# Patient Record
Sex: Female | Born: 1980 | Hispanic: Yes | Marital: Married | Smoking: Never smoker
Health system: Southern US, Community
[De-identification: ages and names within clinical notes are randomized; demographics above are authoritative.]

## PROBLEM LIST (undated history)

## (undated) ENCOUNTER — Emergency Department (HOSPITAL_COMMUNITY): Payer: Self-pay

## (undated) DIAGNOSIS — R519 Headache, unspecified: Secondary | ICD-10-CM

## (undated) DIAGNOSIS — G43909 Migraine, unspecified, not intractable, without status migrainosus: Secondary | ICD-10-CM

## (undated) DIAGNOSIS — K603 Anal fistula, unspecified: Secondary | ICD-10-CM

## (undated) DIAGNOSIS — J45909 Unspecified asthma, uncomplicated: Secondary | ICD-10-CM

## (undated) DIAGNOSIS — Z8709 Personal history of other diseases of the respiratory system: Secondary | ICD-10-CM

## (undated) HISTORY — PX: NO PAST SURGERIES: SHX2092

## (undated) HISTORY — DX: Anal fistula, unspecified: K60.30

## (undated) HISTORY — DX: Anal fistula: K60.3

---

## 2010-07-30 DIAGNOSIS — I729 Aneurysm of unspecified site: Secondary | ICD-10-CM

## 2010-07-30 DIAGNOSIS — H547 Unspecified visual loss: Secondary | ICD-10-CM

## 2010-07-30 HISTORY — DX: Unspecified visual loss: H54.7

## 2010-07-30 HISTORY — DX: Aneurysm of unspecified site: I72.9

## 2012-07-30 NOTE — L&D Delivery Note (Signed)
Attestation of Attending Supervision of Advanced Practitioner (CNM/NP): Evaluation and management procedures were performed by the Advanced Practitioner under my supervision and collaboration.  I have reviewed the Advanced Practitioner's note and chart, and I agree with the management and plan.  HARRAWAY-SMITH, Damonique Brunelle 1:00 PM     

## 2012-07-30 NOTE — L&D Delivery Note (Signed)
Delivery Note At 9:00 AM a viable female was delivered via Vaginal, Spontaneous Delivery (Presentation: ROA).  APGAR: 8, 9; weight pending.   Placenta status: spontaneous, intact.  Cord: 3 vessel cord, central insertion with the following complications: none .  Cord pH: not sent. Infant placed directly on mother's chest.  Bonding with mother and father.  Siblings present at birth.  Anesthesia: Epidural  Lacerations: Insignificant, no repair Est. Blood Loss (mL): 300  Mom to postpartum.  Baby to nursery-stable.  Selena Lesser 05/05/2013, 9:15 AM  I was present for delivery and agree with note above. [facility]

## 2013-05-01 ENCOUNTER — Encounter (HOSPITAL_COMMUNITY): Payer: Self-pay | Admitting: *Deleted

## 2013-05-01 ENCOUNTER — Inpatient Hospital Stay (HOSPITAL_COMMUNITY)
Admission: AD | Admit: 2013-05-01 | Discharge: 2013-05-01 | Disposition: A | Discharge status: Home / Self Care | Payer: Medicaid Other | Source: Ambulatory Visit | Attending: Obstetrics & Gynecology | Admitting: Obstetrics & Gynecology

## 2013-05-01 DIAGNOSIS — O479 False labor, unspecified: Secondary | ICD-10-CM | POA: Insufficient documentation

## 2013-05-01 DIAGNOSIS — O0933 Supervision of pregnancy with insufficient antenatal care, third trimester: Secondary | ICD-10-CM

## 2013-05-01 DIAGNOSIS — O0930 Supervision of pregnancy with insufficient antenatal care, unspecified trimester: Secondary | ICD-10-CM | POA: Insufficient documentation

## 2013-05-01 DIAGNOSIS — N390 Urinary tract infection, site not specified: Secondary | ICD-10-CM | POA: Insufficient documentation

## 2013-05-01 DIAGNOSIS — O2390 Unspecified genitourinary tract infection in pregnancy, unspecified trimester: Secondary | ICD-10-CM | POA: Insufficient documentation

## 2013-05-01 HISTORY — DX: Unspecified asthma, uncomplicated: J45.909

## 2013-05-01 LAB — URINALYSIS, ROUTINE W REFLEX MICROSCOPIC
Protein, ur: NEGATIVE mg/dL
Urobilinogen, UA: 0.2 mg/dL (ref 0.0–1.0)
pH: 6 (ref 5.0–8.0)

## 2013-05-01 LAB — HEPATITIS B SURFACE ANTIGEN: Hepatitis B Surface Ag: NEGATIVE

## 2013-05-01 LAB — ABO/RH: ABO/RH(D): A POS

## 2013-05-01 LAB — CBC
MCH: 28.6 pg (ref 26.0–34.0)
MCHC: 33.9 g/dL (ref 30.0–36.0)
MCV: 84.2 fL (ref 78.0–100.0)
Platelets: 207 10*3/uL (ref 150–400)
RBC: 4.69 MIL/uL (ref 3.87–5.11)
RDW: 13.7 % (ref 11.5–15.5)

## 2013-05-01 LAB — RAPID HIV SCREEN (WH-MAU): Rapid HIV Screen: NONREACTIVE

## 2013-05-01 LAB — DIFFERENTIAL
Basophils Relative: 0 % (ref 0–1)
Eosinophils Absolute: 0.1 10*3/uL (ref 0.0–0.7)
Eosinophils Relative: 1 % (ref 0–5)
Lymphs Abs: 2.2 10*3/uL (ref 0.7–4.0)
Monocytes Absolute: 0.7 10*3/uL (ref 0.1–1.0)
Neutrophils Relative %: 63 % (ref 43–77)

## 2013-05-01 LAB — OB RESULTS CONSOLE GC/CHLAMYDIA: Gonorrhea: NEGATIVE

## 2013-05-01 LAB — URINE MICROSCOPIC-ADD ON

## 2013-05-01 MED ORDER — NITROFURANTOIN MONOHYD MACRO 100 MG PO CAPS: 100.0000 mg | ORAL_CAPSULE | Freq: Two times a day (BID) | ORAL | Status: DC

## 2013-05-01 NOTE — MAU Note (Addendum)
Patient received PNC in [place]. Has not been seen there since July. States son is receiving care in [place]. Denies problems with pregnancy. C/O UC's on and off for a week. More frequent and painful since 0200. C/O yellow vaginal D/C. Denies ROM or bleeding. In course of interview, patient mentions she has had burning with urination.

## 2013-05-01 NOTE — MAU Provider Note (Signed)
History     CSN: [id number]  Arrival date and time: 05/01/13 8657   None     Chief Complaint  Patient presents with  . Contractions   HPI Regina Shea is a 32 y.o. G3P2002 at [redacted]w[redacted]d by pt report presents for evaluation of contractions this morning. Patient states that she has received prenatal care in [place]. Here because her son is being seen for Wynelle Link syndrome [place] Patient has been seen since July has not had prenatal visits since that time. Patient reports that she has normal fetal movement in the morning then decreased rest the day at least 10 in 2 hours. Patient states that she has occasional contractions about every 15 minutes, no vaginal bleeding, no loss of fluid. Patient otherwise is in usual state of health   OB History   Grav Para Term Preterm Abortions TAB SAB Ect Mult Living   3 2 2       2       Past Medical History  Diagnosis Date  . Asthma     Past Surgical History  Procedure Laterality Date  . No past surgeries      History reviewed. No pertinent family history.  History  Substance Use Topics  . Smoking status: Never Smoker   . Smokeless tobacco: Never Used  . Alcohol Use: No    Allergies: No Known Allergies  Prescriptions prior to admission  Medication Sig Dispense Refill  . folic acid (FOLVITE) 1 MG tablet Take 1 mg by mouth daily.      Marland Kitchen PRESCRIPTION MEDICATION Inhale 2 puffs into the lungs every 6 (six) hours as needed (inhaler used for asthma, rescue).        Review of Systems  Constitutional: Negative for fever and chills.  Eyes: Negative for blurred vision and double vision.  Cardiovascular: Negative for chest pain.  Gastrointestinal: Positive for abdominal pain. Negative for nausea and vomiting.  Genitourinary: Positive for dysuria. Negative for urgency, frequency, hematuria and flank pain.  Neurological: Negative for headaches.   Physical Exam   Blood pressure 113/71, pulse 89, temperature 98.2  F (36.8 C), temperature source Oral, resp. rate 18, height 5' 1.5" (1.562 m), weight 80.74 kg (178 lb).  Physical Exam  Nursing note and vitals reviewed. Constitutional: She is oriented to person, place, and time. She appears well-developed and well-nourished. No distress.  HENT:  Head: Normocephalic and atraumatic.  Eyes: Conjunctivae and EOM are normal.  Neck: Normal range of motion.  GI: Soft. She exhibits no distension and no mass. There is no tenderness (gravid). There is no rebound and no guarding.  Genitourinary: Vagina normal.  Neurological: She is alert and oriented to person, place, and time.  Skin: She is not diaphoretic.  Psychiatric: She has a normal mood and affect. Her behavior is normal. Thought content normal.   Dilation: 2 Presentation: Vertex (verified by Regina Shea, bedside U/S) Exam by:: Regina Shea  Exam in presence of Spanish interpreter.    Results for Regina, Shea (MRN [id number]) as of 05/01/2013 10:43  Ref. Range 05/01/2013 09:15  Color, Urine Latest Range: YELLOW  YELLOW  APPearance Latest Range: CLEAR  CLOUDY (A)  Specific Gravity, Urine Latest Range: 1.005-1.030  >1.030 (H)  pH Latest Range: 5.0-8.0  6.0  Glucose Latest Range: NEGATIVE mg/dL NEGATIVE  Bilirubin Urine Latest Range: NEGATIVE  NEGATIVE  Ketones, ur Latest Range: NEGATIVE mg/dL NEGATIVE  Protein Latest Range: NEGATIVE mg/dL NEGATIVE  Urobilinogen, UA Latest Range: 0.0-1.0 mg/dL 0.2  Nitrite Latest Range: NEGATIVE  NEGATIVE  Leukocytes, UA Latest Range: NEGATIVE  MODERATE (A)  Hgb urine dipstick Latest Range: NEGATIVE  NEGATIVE  Urine-Other No range found MUCOUS PRESENT  WBC, UA Latest Range: <3 WBC/hpf 21-50  Squamous Epithelial / LPF Latest Range: RARE  MANY (A)  Bacteria, UA Latest Range: RARE  FEW (A)   FHT 140s mod var, multi accels >15x15, no decels Toco: Uterine irritability.  MAU Course  Procedures  MDM Collected pt prenatal labs, ordered ob comp [place], and confirmed  vtx. Will treat for UTI given sx with +LE  Assessment and Plan  Regina Shea is a 32 y.o. G3P2002 at [redacted]w[redacted]d [place] Ordered PNL, no evidence of labor at this time. Will have pt follow up in clinic next week. Reactive and reassuring infant.   Tx uti with macrobid  Last 2 births required induction.  Regina Shea 05/01/2013, 10:38 AM

## 2013-05-02 LAB — GC/CHLAMYDIA PROBE AMP
CT Probe RNA: NEGATIVE
GC Probe RNA: NEGATIVE

## 2013-05-02 LAB — URINE CULTURE
Colony Count: NO GROWTH
Culture: NO GROWTH

## 2013-05-04 ENCOUNTER — Encounter: Payer: Self-pay | Admitting: Obstetrics and Gynecology

## 2013-05-04 ENCOUNTER — Encounter (HOSPITAL_COMMUNITY): Payer: Self-pay | Admitting: *Deleted

## 2013-05-04 ENCOUNTER — Inpatient Hospital Stay (HOSPITAL_COMMUNITY)
Admission: AD | Admit: 2013-05-04 | Discharge: 2013-05-07 | DRG: 775 | Disposition: A | Discharge status: Home / Self Care | Payer: Medicaid Other | Source: Ambulatory Visit | Attending: Obstetrics & Gynecology | Admitting: Obstetrics & Gynecology

## 2013-05-04 ENCOUNTER — Encounter (HOSPITAL_COMMUNITY): Payer: Self-pay | Admitting: Anesthesiology

## 2013-05-04 ENCOUNTER — Inpatient Hospital Stay (HOSPITAL_COMMUNITY): Payer: Medicaid Other | Admitting: Anesthesiology

## 2013-05-04 ENCOUNTER — Ambulatory Visit (HOSPITAL_COMMUNITY)
Admission: RE | Admit: 2013-05-04 | Discharge: 2013-05-04 | Disposition: A | Discharge status: Home / Self Care | Payer: Medicaid Other | Source: Ambulatory Visit | Attending: Obstetrics and Gynecology | Admitting: Obstetrics and Gynecology

## 2013-05-04 ENCOUNTER — Other Ambulatory Visit: Payer: Self-pay | Admitting: Obstetrics and Gynecology

## 2013-05-04 ENCOUNTER — Ambulatory Visit (INDEPENDENT_AMBULATORY_CARE_PROVIDER_SITE_OTHER): Payer: Medicaid Other | Admitting: Obstetrics and Gynecology

## 2013-05-04 VITALS — BP 120/79 | Ht 61.02 in | Wt 177.8 lb

## 2013-05-04 DIAGNOSIS — O0930 Supervision of pregnancy with insufficient antenatal care, unspecified trimester: Secondary | ICD-10-CM

## 2013-05-04 DIAGNOSIS — Z22330 Carrier of Group B streptococcus: Secondary | ICD-10-CM

## 2013-05-04 DIAGNOSIS — Z3480 Encounter for supervision of other normal pregnancy, unspecified trimester: Secondary | ICD-10-CM | POA: Insufficient documentation

## 2013-05-04 DIAGNOSIS — O368130 Decreased fetal movements, third trimester, not applicable or unspecified: Secondary | ICD-10-CM

## 2013-05-04 DIAGNOSIS — O368190 Decreased fetal movements, unspecified trimester, not applicable or unspecified: Principal | ICD-10-CM | POA: Diagnosis present

## 2013-05-04 DIAGNOSIS — Z3483 Encounter for supervision of other normal pregnancy, third trimester: Secondary | ICD-10-CM

## 2013-05-04 DIAGNOSIS — O0933 Supervision of pregnancy with insufficient antenatal care, third trimester: Secondary | ICD-10-CM

## 2013-05-04 DIAGNOSIS — O480 Post-term pregnancy: Secondary | ICD-10-CM | POA: Insufficient documentation

## 2013-05-04 DIAGNOSIS — O99892 Other specified diseases and conditions complicating childbirth: Secondary | ICD-10-CM | POA: Diagnosis present

## 2013-05-04 DIAGNOSIS — Z23 Encounter for immunization: Secondary | ICD-10-CM

## 2013-05-04 DIAGNOSIS — Z3689 Encounter for other specified antenatal screening: Secondary | ICD-10-CM | POA: Insufficient documentation

## 2013-05-04 LAB — POCT URINALYSIS DIP (DEVICE)
Glucose, UA: NEGATIVE mg/dL
Nitrite: NEGATIVE
Protein, ur: NEGATIVE mg/dL
Urobilinogen, UA: 0.2 mg/dL (ref 0.0–1.0)

## 2013-05-04 LAB — TYPE AND SCREEN: ABO/RH(D): A POS

## 2013-05-04 LAB — CBC
Hemoglobin: 14.3 g/dL (ref 12.0–15.0)
MCH: 28.7 pg (ref 26.0–34.0)
MCV: 84.7 fL (ref 78.0–100.0)
RBC: 4.98 MIL/uL (ref 3.87–5.11)

## 2013-05-04 LAB — RPR: RPR Ser Ql: NONREACTIVE

## 2013-05-04 LAB — GLUCOSE TOLERANCE, 1 HOUR (50G) W/O FASTING: Glucose, 1 Hour GTT: 118 mg/dL (ref 70–140)

## 2013-05-04 MED ORDER — LACTATED RINGERS IV SOLN: 500.0000 mL | Freq: Once | INTRAVENOUS | Status: DC

## 2013-05-04 MED ORDER — EPHEDRINE 5 MG/ML INJ
10.0000 mg | INTRAVENOUS | Status: DC | PRN
  Filled 2013-05-04: qty 2

## 2013-05-04 MED ORDER — FLEET ENEMA 7-19 GM/118ML RE ENEM: 1.0000 | ENEMA | RECTAL | Status: DC | PRN

## 2013-05-04 MED ORDER — TETANUS-DIPHTH-ACELL PERTUSSIS 5-2.5-18.5 LF-MCG/0.5 IM SUSP
0.5000 mL | Freq: Once | INTRAMUSCULAR | Status: AC
  Administered 2013-05-04: 0.5 mL via INTRAMUSCULAR

## 2013-05-04 MED ORDER — LACTATED RINGERS IV SOLN: 500.0000 mL | INTRAVENOUS | Status: DC | PRN

## 2013-05-04 MED ORDER — FENTANYL 2.5 MCG/ML BUPIVACAINE 1/10 % EPIDURAL INFUSION (WH - ANES)
14.0000 mL/h | INTRAMUSCULAR | Status: DC | PRN
  Administered 2013-05-04 – 2013-05-05 (×2): 14 mL/h via EPIDURAL
  Filled 2013-05-04 (×2): qty 125

## 2013-05-04 MED ORDER — TERBUTALINE SULFATE 1 MG/ML IJ SOLN: 0.2500 mg | Freq: Once | INTRAMUSCULAR | Status: AC | PRN

## 2013-05-04 MED ORDER — PENICILLIN G POTASSIUM 5000000 UNITS IJ SOLR
5.0000 10*6.[IU] | Freq: Once | INTRAVENOUS | Status: AC
  Administered 2013-05-04: 5 10*6.[IU] via INTRAVENOUS
  Filled 2013-05-04: qty 5

## 2013-05-04 MED ORDER — PHENYLEPHRINE 40 MCG/ML (10ML) SYRINGE FOR IV PUSH (FOR BLOOD PRESSURE SUPPORT)
80.0000 ug | PREFILLED_SYRINGE | INTRAVENOUS | Status: DC | PRN
  Administered 2013-05-04: 80 ug via INTRAVENOUS
  Filled 2013-05-04: qty 2

## 2013-05-04 MED ORDER — DIPHENHYDRAMINE HCL 50 MG/ML IJ SOLN: 12.5000 mg | INTRAMUSCULAR | Status: DC | PRN

## 2013-05-04 MED ORDER — ONDANSETRON HCL 4 MG/2ML IJ SOLN: 4.0000 mg | Freq: Four times a day (QID) | INTRAMUSCULAR | Status: DC | PRN

## 2013-05-04 MED ORDER — LIDOCAINE HCL (PF) 1 % IJ SOLN
INTRAMUSCULAR | Status: DC | PRN
  Administered 2013-05-04 (×2): 5 mL

## 2013-05-04 MED ORDER — OXYTOCIN 40 UNITS IN LACTATED RINGERS INFUSION - SIMPLE MED
1.0000 m[IU]/min | INTRAVENOUS | Status: DC
  Administered 2013-05-04 – 2013-05-05 (×2): 2 m[IU]/min via INTRAVENOUS
  Filled 2013-05-04: qty 1000

## 2013-05-04 MED ORDER — EPHEDRINE 5 MG/ML INJ
10.0000 mg | INTRAVENOUS | Status: DC | PRN
  Filled 2013-05-04: qty 2
  Filled 2013-05-04: qty 4

## 2013-05-04 MED ORDER — OXYTOCIN 40 UNITS IN LACTATED RINGERS INFUSION - SIMPLE MED: 62.5000 mL/h | INTRAVENOUS | Status: DC

## 2013-05-04 MED ORDER — MISOPROSTOL 25 MCG QUARTER TABLET
25.0000 ug | ORAL_TABLET | ORAL | Status: DC | PRN
  Administered 2013-05-04: 25 ug via VAGINAL
  Filled 2013-05-04 (×2): qty 1

## 2013-05-04 MED ORDER — PENICILLIN G POTASSIUM 5000000 UNITS IJ SOLR
2.5000 10*6.[IU] | INTRAVENOUS | Status: DC
  Administered 2013-05-04 – 2013-05-05 (×4): 2.5 10*6.[IU] via INTRAVENOUS
  Filled 2013-05-04 (×7): qty 2.5

## 2013-05-04 MED ORDER — PHENYLEPHRINE 40 MCG/ML (10ML) SYRINGE FOR IV PUSH (FOR BLOOD PRESSURE SUPPORT)
80.0000 ug | PREFILLED_SYRINGE | INTRAVENOUS | Status: DC | PRN
  Filled 2013-05-04: qty 2
  Filled 2013-05-04: qty 5

## 2013-05-04 MED ORDER — OXYTOCIN BOLUS FROM INFUSION: 500.0000 mL | INTRAVENOUS | Status: DC

## 2013-05-04 MED ORDER — OXYCODONE-ACETAMINOPHEN 5-325 MG PO TABS: 1.0000 | ORAL_TABLET | ORAL | Status: DC | PRN

## 2013-05-04 MED ORDER — CITRIC ACID-SODIUM CITRATE 334-500 MG/5ML PO SOLN: 30.0000 mL | ORAL | Status: DC | PRN

## 2013-05-04 MED ORDER — LIDOCAINE HCL (PF) 1 % IJ SOLN
30.0000 mL | INTRAMUSCULAR | Status: DC | PRN
  Filled 2013-05-04 (×2): qty 30

## 2013-05-04 MED ORDER — ACETAMINOPHEN 325 MG PO TABS: 650.0000 mg | ORAL_TABLET | ORAL | Status: DC | PRN

## 2013-05-04 MED ORDER — LACTATED RINGERS IV SOLN
INTRAVENOUS | Status: DC
  Administered 2013-05-04 – 2013-05-05 (×3): via INTRAVENOUS

## 2013-05-04 MED ORDER — IBUPROFEN 600 MG PO TABS: 600.0000 mg | ORAL_TABLET | Freq: Four times a day (QID) | ORAL | Status: DC | PRN

## 2013-05-04 NOTE — Anesthesia Procedure Notes (Signed)

## 2013-05-04 NOTE — Progress Notes (Signed)
Patient to be worked into the U/S schedule today after her labs ar e drawn at 61 amTthe interpreter will walk her upstairs to her appt.

## 2013-05-04 NOTE — Progress Notes (Signed)
Pulse- 106 Patient does reports less movement than usual for baby; patient also reports pain with nightly contractions

## 2013-05-04 NOTE — Patient Instructions (Signed)
Embarazo  Systems analyst trimestre  (Pregnancy - Third Trimester) El tercer trimestre del Psychiatrist (los ltimos 3 meses) es el perodo en el cual tanto usted como su beb crecen con ms rapidez. El beb alcanza un largo de aproximadamente 50 cm. y pesa entre 2,700 y 4,500 kg. El beb gana ms tejido graso y est listo para la vida fuera del cuerpo de la Rogers. Mientras estn en el interior, los bebs tienen perodos de sueo y vigilia, Warehouse manager y tienen hipo. Quizs sienta pequeas contracciones del tero. Este es el falso trabajo de Time. Tambin se las conoce como contracciones de Braxton-Hicks . Es como una prctica del parto. Los problemas ms habituales de esta etapa del embarazo incluyen mayor dificultad para respirar, hinchazn de las manos y los pies por retencin de lquidos y la necesidad de Geographical information systems officer con ms frecuencia debido a que el tero y el beb presionan sobre la vejiga.  EXAMENES PRENATALES   Durante los Manpower Inc, deber seguir realizndose anlisis de [place]. Estas pruebas se realizan para controlar su salud y la del beb. Los ARAMARK Corporation de sangre se Radiographer, therapeutic para [employer] niveles de algunos compuestos de la sangre (hemoglobina). La anemia (bajo nivel de hemoglobina) es frecuente durante el embarazo. Para prevenirla, se administran hierro y vitaminas. Tambin le tomarn nuevas anlisis para descartar diabetes. Podrn repetirle algunas de las Hovnanian Enterprises hicieron previamente.  En cada visita le medirn el tamao del tero. Esto permite asegurar que el beb se desarrolla adecuadamente, segn la fecha del embarazo.  Le controlarn la presin arterial en cada visita prenatal. Esto es para asegurarse de que no sufre toxemia.  Le harn un anlisis de orina en cada visita prenatal, para descartar infecciones, diabetes y la presencia de protenas.  Tambin en cada visita controlarn su peso. Esto se realiza para asegurarse que aumenta de peso al ritmo indicado y que usted y  su beb evolucionan normalmente.  En algunas ocasiones se realiza una prueba de ultrasonido para confirmar el correcto desarrollo y evolucin del beb. Esta prueba se realiza con ondas sonoras inofensivas para el beb, de modo que el profesional pueda calcular ms precisamente la fecha del [place].  Analice con su mdico los analgsicos y la anestesia que recibir durante el Verona de parto y Schriever.  Comente la posibilidad de que necesite una cesrea y qu anestesia se recibir.  Informe a su mdico si sufre violencia familiar mental o fsica. A veces, se indica la prueba especializada sin estrs, la prueba de tolerancia a las contracciones y el perfil biofsico para asegurarse de que el beb no tiene problemas. El estudio del lquido amnitico que rodea al beb se llama amniocentesis. El lquido amnitico se obtiene introduciendo una aguja en el vientre (abdomen ). En ocasiones se lleva a cabo cerca del final del embarazo, si es necesario inducir a un parto. En este caso se realiza para asegurarse que los pulmones del beb estn lo suficientemente maduros como para que pueda vivir fuera del tero. Si los pulmones no han madurado y es peligroso que el beb nazca, se Building services engineer a la madre una inyeccin de [place] , 1 a 2 [address]  [address] antes del [address]. Vivia Budge ayuda a que los pulmones del beb maduren y sea ms seguro su nacimiento.  CAMBIOS QUE OCURREN EN EL TERCER TRIMESTRE DEL EMBARAZO  Su organismo atravesar numerosos cambios durante el Melvin. Estos pueden variar de Neomia Dear persona a otra. Converse con el profesional que la asiste acerca los cambios que  usted note y que la preocupen.   Durante el ltimo trimestre probablemente sienta un aumento del apetito. Es normal tener "antojos" de Development worker, community. Esto vara de Neomia Dear persona a otra y de un embarazo a Therapist, art.  Podrn aparecer las primeras estras en las caderas, abdomen y [place]on. Estos son cambios normales del cuerpo durante el [place]rett. No existen  medicamentos ni ejercicios que puedan prevenir CarMax.  La constipacin puede tratarse con un laxante o agregando fibra a su dieta. Beber grandes cantidades de lquidos, tomar fibras en forma de vegetales, frutas y granos integrales es de gran [place].  Tambin es beneficioso practicar actividad fsica. Si ha sido una persona Engineer, mining, podr continuar con la Harley-Davidson de las actividades durante el mismo. Si ha sido [employer], puede ser beneficioso que comience con un programa de ejercicios, Museum/gallery exhibitions officer. Consulte con el profesional que la asiste antes de comenzar un programa de ejercicios.  Evite el consumo de cigarrillos, el alcohol, los medicamentos no recetados y las "drogas de la calle" durante el Psychiatrist. Estas sustancias qumicas afectan la formacin y el desarrollo del beb. Evite estas sustancias durante todo el embarazo para asegurar el nacimiento de un beb sano.  Podr sentir dolor de espalda, tener vrices en las venas y hemorroides, o si ya los sufra, pueden Cricket.  Durante el tercer trimestre se cansar con ms facilidad, lo cual es normal.  Los movimientos del beb pueden ser ms fuertes y con ms frecuencia.  Puede que note dificultades para respirar normalmente.  El ombligo puede salir hacia afuera.  A veces sale Veterinary surgeon de las [place], que se llama Product manager.  Podr aparecer Neomia Dear secrecin mucosa con sangre. Esto suele ocurrir General Electric unos [address] y Neomia Dear semana antes del [place]. INSTRUCCIONES PARA EL CUIDADO EN EL HOGAR   Cumpla con las citas de control. Siga las indicaciones del mdico con respecto al uso de [place], los ejercicios y la dieta.  Durante el embarazo debe obtener nutrientes para usted y para su beb. Consuma alimentos balanceados a intervalos regulares. Elija alimentos como carne, pescado, [place] y otros productos lcteos descremados, vegetales, frutas, panes integrales y cereales. El Office Depot informar  cul es el aumento de peso ideal.  Las relaciones sexuales pueden continuarse hasta casi el final del embarazo, si no se presentan otros problemas como prdida prematura (antes de [place]s) de lquido amnitico, hemorragia vaginal o dolor en el vientre (abdominal).  Realice Tesoro Corporation, si no tiene restricciones. Consulte con el profesional que la asiste si no sabe con certeza si determinados ejercicios son seguros. El mayor aumento de peso se producir en los ltimos 2 trimestres del Psychiatrist. El ejercicio ayuda a:  Engineering geologist.  Mantenerse en forma para el trabajo de parto y [place] .  Perder peso despus del parto.  Haga reposo con frecuencia, con las piernas elevadas, o segn lo necesite para evitar los calambres y el dolor de cintura.  Use un buen sostn o como los que se usan para hacer deportes para Paramedic la sensibilidad de las [place]. Tambin puede serle til si lo [place] mientras duerme. Si pierde Product manager, podr Parker Hann[employer]in.  No utilice la baera con agua caliente, baos turcos y saunas.   Colquese el cinturn de seguridad cuando conduzca. Este la proteger a usted y al beb en caso de accidente.  Evite comer carne cruda y el contacto con los utensilios y desperdicios de los gatos. Estos  elementos contienen grmenes que pueden causar defectos de nacimiento en el beb.  Es fcil perder algo de orina durante el [place]. Apretar y Chief Operating Officer los msculos de la pelvis la ayudar con este problema. Practique detener la miccin cuando est en el bao. Estos son los mismos msculos que Development worker, international aid. Son [employer] Partners mismos msculos que utiliza cuando trata de evitar despedir gases. Puede practicar apretando estos msculos WellPoint, y repetir esto tres veces por da aproximadamente. Una vez que conozca qu msculos debe apretar, no realice estos ejercicios durante la miccin. Puede favorecerle una infeccin si la orina vuelve hacia  atrs.  Pida ayuda si tienen necesidades financieras, teraputicas o nutricionales. El profesional podr ayudarla con respecto a estas necesidades, o derivarla a otros especialistas.  Haga una lista de nmeros telefnicos de emergencia y tngalos disponibles.  Planifique como obtener ayuda de familiares o amigos cuando regrese a Programmer, applications hospital.  Hacer un ensayo sobre la partida al hospital.  [place] clases prenatales con el padre para entender, practicar y hacer preguntas sobre el Bethany de parto y el alumbramiento.  Preparar la habitacin del beb / busque Fatima Blank.  No viaje fuera de la ciudad a menos que sea absolutamente necesario y con el asesoramiento de su mdico.  Use slo zapatos de tacn bajo o sin tacn para tener mejor equilibrio y Automotive engineer cadas. USO DE MEDICAMENTOS Y CONSUMO DE DROGAS DURANTE EL [facility]   Tome las vitaminas apropiadas para esta etapa tal como se le indic. Las vitaminas deben contener un miligramo de cido flico. Guarde todas las vitaminas fuera del alcance de los nios. La ingestin de slo un par de vitaminas o tabletas que contengan hierro pueden ocasionar la New[employer] Mining en un beb o en un nio pequeo.  Evite el uso de The Mutual of Omaha, incluyendo hierbas, medicamentos de [place], sin receta o que no hayan sido sugeridos por su mdico. Slo tome medicamentos de venta libre o medicamentos recetados para Chief Technology Officer, Environmental health practitioner o fiebre como lo indique su mdico. No tome aspirina, ibuprofeno o naproxeno excepto que su mdico se lo indique.  Infrmele al profesional si consume alguna droga.  El alcohol se relaciona con ciertos defectos congnitos. Incluye el sndrome de alcoholismo fetal. Debe evitar absolutamente el consumo de alcohol, en cualquier forma. El fumar produce baja tasa de natalidad y bebs prematuros.  Las drogas ilegales o de la calle son muy perjudiciales para el beb. Estn absolutamente prohibidas. Un beb que nace de [employer] Energy, ser adicto al nacer. Ese beb tendr los mismos sntomas de abstinencia que un adulto. SOLICITE ATENCIN MDICA SI:  Tiene preguntas o preocupaciones relacionadas con el embarazo. Es mejor que llame para formular las preguntas si no puede esperar hasta la prxima visita, que sentirse preocupada por ellas.  SOLICITE ATENCIN MDICA DE INMEDIATO SI:   La temperatura oral le sube a ms de 38,9 C (102 F) o lo que su mdico le indique.  Tiene una prdida de lquido por la vagina (canal de parto). Si sospecha una ruptura de las [place], tmese la temperatura y llame al profesional para informarlo sobre esto.  Observa unas pequeas manchas, una hemorragia vaginal o elimina cogulos. Notifique al profesional acerca de la cantidad y de cuntos apsitos est utilizando.  Presenta un olor desagradable en la secrecin vaginal y observa un cambio en el color, de transparente a blanco.  Ha vomitado durante ms de 24 horas.  Siente escalofros o le sube la fiebre.  Conley Rolls  falta el aire.  Siente ardor al Beatrix Shipper.  Baja o sube ms de 2 libras (900 g), o segn lo indicado por el profesional que la asiste.  Observa que sbitamente se le hinchan el rostro, las manos, los pies o las piernas.  Siente dolor en el vientre (abdominal). Las [employer]ul en el ligamento redondo son Neomia Dear causa benigna frecuente de dolor abdominal durante el embarazo. El profesional que la asiste deber evaluarla.  Presenta dolor de cabeza intenso que no se [place].  Tiene problemas visuales, visin doble o borrosa.  Si no siente los movimientos del beb durante ms de 1 hora. Si piensa que el beb no se mueve tanto como lo haca habitualmente, coma algo que Psychologist, clinical y Target Corporation lado izquierdo durante [place]. El beb debe moverse al menos 4  5 veces por hora. Comunquese inmediatamente si el beb se mueve menos que lo indicado.  Se cae, se ve involucrada en un accidente automovilstico o sufre algn  tipo de traumatismo.  En su hogar hay violencia mental o fsica. Document Released: 04/25/2005 Document Revised: 04/09/2012 [facility] Patient Information 2014 [place], [place].  Eleccin del mtodo anticonceptivo  (Contraception Choices) La anticoncepcin (control de la natalidad) es el uso de cualquier mtodo o dispositivo para Location manager. A continuacin se indican algunos de esos mtodos.  MTODOS HORMONALES   Implante anticonceptivo. Es un tubo plstico delgado que contiene la hormona progesterona. No contiene estrgenos. El mdico inserta el tubo en la parte interna del brazo. El tubo puede Geneticist, molecular durante 3 aos. Despus de los 3 aos debe retirarse. El implante impide que los ovarios liberen vulos (ovulacin), espesa el moco cervical, lo que evita que los espermatozoides ingresen al tero y hace ms delgada la membrana que cubre el interior del tero.  Inyecciones de progesterona sola. Estas inyecciones se administran cada 3 meses para evitar el embarazo. La progesterona sinttica impide que los ovarios liberen vulos. Tambin hace que el moco cervical se espese y modifica el recubrimiento interno del tero. Esto hace ms difcil que los espermatozoides sobrevivan en el tero.  Pldoras anticonceptivas. Las pldoras anticonceptivas contienen estrgenos y Education officer, museum. Actan impidiendo que el vulo se forme en el ovario(ovulacin). Las pldoras anticonceptivas son recetadas por el mdico.Tambin se utilizan para tratar los perodos menstruales abundantes.  Minipldora. Este tipo de pldora anticonceptiva contiene slo hormona progesterona. Deben tomarse todos los [address]  [address] del mes y debe recetarlas el mdico.  Parches anticonceptivos. El parche contiene hormonas similares a las que contienen las pldoras anticonceptivas. Deben cambiarse una vez por semana y se utilizan bajo prescripcin mdica.  Anillo vaginal. Anillo vaginal contiene hormonas similares a las que  contienen las pldoras anticonceptivas. Se deja colocado durante tres semanas, se lo retira durante 1 semana y luego se coloca uno nuevo. La paciente debe sentirse cmoda para insertar y retirar el anillo de la vagina.Es necesaria la receta del mdico.  Anticonceptivos de Associate Professor. Los anticonceptivos de emergencia son mtodos para evitar un embarazo despus de una relacin sexual sin proteccin. Esta pldora puede tomarse inmediatamente despus de Child psychotherapist sexuales o hasta 5 [place] de haber tenido sexo sin proteccin. Es ms efectiva si se toma poco tiempo despus. Los anticonceptivos de emergencia estn disponibles sin prescripcin mdica. Consltelo con su farmacutico. No use los anticonceptivos de emergencia como nico mtodo anticonceptivo. MTODOS DE BARRERA   Condn masculino. Es una vaina delgada (ltex o goma) que se [place] en el pene durante el acto sexual. Deri Fuelling con  espermicida para aumentar la efectividad.  Condn femenino. Es una vaina blanda y floja que se adapta suavemente a la vagina antes de las relaciones sexuales.  Diafragma. Es una barrera de ltex redonda y Casimer Bilis que debe ser ajustada por un profesional. Se inserta en la vagina, junto con un gel espermicida. Debe insertarse antes de Management consultant. Debe dejar el diafragma colocado en la vagina durante 6 a 8 horas despus de la relacin sexual.  Capuchn cervical. Es una taza de ltex o plstico, redonda y [place] que cubre el cuello del tero y debe ser ajustada por un mdico. Puede dejarlo colocado en la vagina hasta 48 horas despus de las Clinical research associate.  Esponja. Es una pieza blanda y circular de espuma de poliuretano. Contiene un espermicida. Se inserta en la vagina despus de mojarla y antes de las [employer] St. [employer].  Espermicidas. Los espermicidas son qumicos que matan o bloquean el esperma y no lo dejan ingresar al cuello del tero y al tero. Vienen en forma de cremas, geles, supositorios,  espuma o comprimidos. No es necesario tener Emergency planning/management officer. Se insertan en la vagina con un aplicador antes de Management consultant. El proceso debe repetirse cada vez que tiene relaciones sexuales. ANTICONCEPTIVOS INTRAUTERINOS   Dispositivo intrauterino (DIU). Es un dispositivo en forma de T que se coloca en el tero durante el perodo menstrual, para Location manager. Hay dos tipos:  DIU de cobre. Este tipo de DIU est recubierto con un alambre de cobre y se inserta dentro del tero. El cobre hace que el tero y las trompas de Falopio produzcan un liquido que [employer] Stores espermatozoides. Puede permanecer colocado durante 10 aos.  DIU hormonal. Este tipo de DIU contiene la hormona progestina (progesterona sinttica). La hormona espesa el moco cervical y evita que los espermatozoides ingresen al tero y tambin afina la membrana que cubre el tero para evitar la implantacin del vulo fertilizado. La hormona debilita o destruye los espermatozoides que ingresan al tero. Puede permanecer colocado durante 5 aos. MTODOS ANTICONCEPTIVOS PERMANENTES   Ligadura de trompas en la mujer. La ligadura de trompas en la mujer se realiza sellando, atando u obstruyendo quirrgicamente las trompas de Falopio lo que impide que el vulo descienda hacia el tero.  Esterilizacin masculina. Se realiza atando los conductos por los que pasan los espermatozoides (vasectoma).Esto impide que el esperma ingrese a la vagina durante el acto sexual. Luego del procedimiento, el hombre puede eyacular lquido (semen). MTODOS DE PLANIFICACIN NATURAL   Planificacin familiar natural.  Consiste en no tener relaciones sexuales o usar un mtodo de barrera (condn, [place], capuchn cervical) en los [employer] la mujer podra quedar [place].  Mtodo calendario.  Consiste en el seguimiento de la duracin de cada ciclo menstrual y la identificacin de los perodos frtiles.  Mtodo de Occupational hygienist.  Consiste en evitar  las relaciones sexuales durante la ovulacin.  Mtodo sintotrmico. Paramedic las relaciones sexuales en la poca en la que se est ovulando, utilizando un termmetro y tendiendo en cuenta los sntomas de la ovulacin.  Mtodo post-ovulacin. Consiste en planificar las relaciones sexuales para despus de haber ovulado. Independientemente del tipo o mtodo anticonceptivo que usted elija, es importante que use condones para protegerse contra las enfermedades de transmisin sexual (ETS). Hable con su mdico con respecto a qu mtodo anticonceptivo es el ms apropiado para usted.  Document Released: 07/16/2005 Document Revised: 10/08/2011 [facility] Patient Information 2014 [place], [place].  Lactancia materna  (Breastfeeding)  El cambio hormonal  durante el embarazo produce el desarrollo del tejido [place] y un aumento en el nmero y tamao de los conductos galactforos. La hormona prolactina permite que las protenas, los azcares y las grasas de la sangre produzcan la [employer] materna en las glndulas productoras de Railroad. La hormona progesterona impide que la leche materna sea liberada antes del nacimiento del beb. Despus del nacimiento del beb, su nivel de progesterona disminuye permitiendo que la leche materna sea [place]. Pensar en el beb, as como la succin o Theatre manager, pueden estimular la liberacin de [place] de las glndulas productoras de [place].  La decisin de Company secretary) es una de las mejores opciones que usted puede hacer para usted y su beb. La informacin que sigue da una breve resea de los beneficios, as Lexicographer que debe saber sobre la [place].  LOS BENEFICIOS DE AMAMANTAR  Para el beb   La primera leche (calostro) ayuda al mejor funcionamiento del sistema digestivo del beb.   La leche tiene anticuerpos que provienen de la madre y que ayudan a prevenir las infecciones en el beb.   El beb tiene una menor incidencia de  asma, alergias y del sndrome de muerte sbita del lactante (SMSL).   Los nutrientes de la [place] materna son mejores para el beb que la [place].  La leche materna mejora el desarrollo cerebral del beb.   Su beb tendr menos gases, clicos y estreimiento.  Es menos probable que el beb desarrolle otras enfermedades, como obesidad infantil, asma o diabetes mellitus. Para usted   La lactancia materna favorece el desarrollo de un vnculo muy especial entre la madre y el beb.   Es ms conveniente, siempre disponible, a la [place] y [place].   La lactancia materna ayuda a quemar caloras y a perder el peso ganado durante el [place].   Hace que el tero se contraiga ms rpidamente a su tamao normal y Consolidated Edison sangrado despus del [place].   Las [employer] Holdings que amamantan tienen menos riesgo de Environmental education officer osteoporosis o cncer de mama o de ovario en el futuro.  FRECUENCIA DEL AMAMANTAMIENTO   Un beb sano, nacido a trmino, puede amamantarse con tanta frecuencia como cada hora, o espaciar las comidas cada tres horas. La frecuencia en la lactancia varan de un beb a otro.   Los recin nacidos deben ser alimentados por lo menos cada 2-3 horas Administrator y cada 4-5 horas durante la noche. Usted debe amamantarlo un mnimo de 8 tomas en un perodo de 24 horas.  Despierte al beb para amamantarlo si han pasado 3-4 horas desde la ltima comida.  Amamante cuando sienta la necesidad de reducir la plenitud de sus senos o cuando el beb muestre signos de [place]. Las seales de que el beb puede Gentry Fitz son:  Lenora Boys su estado de alerta o vigilancia.  Se estira.  Mueve la cabeza de un lado a otro.  Mueve la cabeza y abre la boca cuando se le toca la mejilla o la boca (reflejo de succin).  Aumenta las vocalizaciones, tales como sonidos de succin, relamerse los labios, arrullos, suspiros, o chirridos.  Mueve la [employer] boca.  Se chupa con  ganas los dedos o las manos.  Agitacin.  Llanto intermitente.  Los signos de hambre extrema requerirn que lo calme y lo consuele antes de tratar de alimentarlo. Los signos de hambre extrema son:  Agitacin.  Llanto fuerte e intenso.  Gritos.  El amamantamiento frecuente la ayudar  a producir ms [place] y a Education officer, community de Engineer, mining en los pezones e hinchazn de las [place].  LACTANCIA MATERNA   Ya sea que se encuentre acostada o sentada, asegrese que el abdomen del beb est enfrente el suyo.   Sostenga la mama con el pulgar por arriba y los otros 4 dedos por debajo del pezn. Asegrese que sus dedos se encuentren lejos del pezn y de la boca del beb.   Empuje suavemente los labios del beb con el pezn o con el dedo.   Cuando la boca del beb se abra lo suficiente, introduzca el pezn y la zona oscura que lo rodea (areola) tanto como le sea posible dentro de la boca.  Debe haber ms areola visible por arriba del labio superior que por debajo del labio inferior.  La lengua del beb debe estar entre la enca inferior y el seno.  Asegrese de que la boca del beb est en la posicin correcta alrededor del pezn (prendida). Los labios del beb deben crear un sello sobre su pecho.  Las seales de que el beb se ha prendido eficazmente al pezn son:  Payton Doughty o succiona sin dolor.  Se escucha que traga Lyondell Chemical.  No hace ruidos ni chasquidos.  Hay movimientos musculares por arriba y por delante de sus odos al Printmaker.  El beb debe succionar unos 2-3 minutos para que salga la [place]. Permita que el nio se alimente en cada mama todo lo que desee. Alimente al beb hasta que se desprenda o se quede dormido en Freight forwarder y luego ofrzcale el segundo pecho.  Las seales de que el beb est lleno y satisfecho son:  Disminuye gradualmente el nmero de succiones o no succiona.  Se queda dormido.  Extiende o relaja su cuerpo.  Retiene una pequea cantidad  de [facility] boca.  Se desprende del pecho por s mismo.  Los signos de una lactancia materna eficaz son:  Los senos han aumentado la firmeza, el peso y el tamao antes de la alimentacin.  Son ms blandos despus de amamantar.  Un aumento del volumen de [place], y tambin el cambio de su consistencia y color se producen hacia el quinto da de Tour manager.  La congestin mamaria se [place] al dar de [place].  Los pezones no duelen, ni estn agrietados ni sangran.  De ser necesario, interrumpa la succin poniendo su dedo en la esquina de la boca del beb y deslizando el dedo entre sus encas. A continuacin, retire la mama de su boca.  Es comn que los bebs regurgiten un poco despus de comer.  A menudo los bebs tragan aire al alimentarse. Esto puede hacer que se sienta molesto. Hacer eructar al beb al [place] de pecho puede ser de [place].  Se recomiendan suplementos de vitamina D para los bebs que reciben slo [address].  Evite el uso del chupete durante las primeras 4 a 6 semanas de vida.  Evite la alimentacin suplementaria con agua, frmula o jugo en lugar de la [employer]. La leche materna es todo el alimento que el beb necesita. No es necesario que el nio ingiera agua o preparados de bibern. Sus pechos producirn ms leche si se evita la alimentacin suplementaria durante las primeras semanas. COMO SABER SI EL BEB OBTIENE LA SUFICIENTE LECHE MATERNA  Preguntarse si el beb obtiene la cantidad suficiente de [place] es una preocupacin frecuente [employer]. Puede asegurarse que el beb tiene la leche suficiente si:  El beb succiona activamente y usted escucha que traga.   El beb parece estar relajado y satisfecho despus de Psychologist, clinical.   El nio se alimenta al menos 8 a 12 veces en 24 horas.  Durante los primeros 3 a 5 das de vida:  Moja 3-5 paales en 24 horas. La materia fecal debe ser blanda y [place].  Tiene al menos 3 a 4 deposiciones en 24  horas. La materia fecal debe ser blanda y [place].  A los 5-7 das de vida, el beb debe tener al menos 3-6 deposiciones en 24 horas. La materia fecal debe ser grumosa y [place] a los 5 [address]  [address] de [place].  Su beb tiene una prdida de Psychologist, counselling a 7al 10% durante los primeros 3 [address]  [address] de [address]  El beb no pierde peso despus de 3-7 [address]  [address] de [address]  El beb debe aumentar 4 a 6 libras (120 a 170 gr.) por semana despus de los 4 [address]  [address] de vida.  Aumenta de [place] a los [address] de vida y vuelve al peso del nacimiento dentro de las 2 semanas. CONGESTIN MAMARIA  Durante la primera semana despus del [place], usted puede experimentar hinchazn en las mamas (congestin Navy). Al estar congestionadas, las mamas se sienten pesadas, calientes o sensibles al tacto. El pico de la congestin ocurre a las 24 -48 horas despus del parto.   La congestin puede disminuirse:  Continuando con la Tour manager.  Aumentando la frecuencia.  Tomando duchas calientes o aplicando calor hmedo en los senos antes de cada comida. Esto aumenta la circulacin y [place] a que la [place].   Masajeando suavemente el pecho antes y [place] [employer]. Con las yemas de los dedos, masajee la pared del pecho hacia el pezn en un movimiento circular.   Asegurarse de que el beb vaca al menos uno de sus pechos en cada alimentacin. Tambin ayuda si comienza la siguiente toma en el otro seno.   Extraiga manualmente o con un sacaleches las mamas para vaciar los pechos si el beb tiene sueo o no se aliment bien. Tambin puede extraer la [employer] cuando vuelva a trabajar o si siente que se estn congestionando las [place].  Asegrese de que el beb se prende y est bien colocado durante la Market researcher. Si sigue estas indicaciones, la congestin debe mejorar en 24 a 48 horas. Si an tiene dificultades, consulte a Barista.  CUDESE USTED MISMA  Cuide sus mamas.   Bese o dchese diariamente.   Evite usar [employer].   Use un sostn de soporte Evite el uso de sostenes con aro.  Seque al aire sus pezones durante 3-4 minutos despus de cada comida.   Utilice slo apsitos de algodn en el sostn para absorber las prdidas de [place]. La prdida de un poco de [employer] las comidas es normal.   Use solamente lanolina pura en sus pezones despus de Museum/gallery exhibitions officer. Usted no tiene que lavarla antes de alimentar al beb. Otra opcin es sacarse unas gotas de [place] y [employer] pezones.  Continuar con los autocontroles de la mama. Cudese.   Consuma alimentos saludables. Alterne 3 comidas con 3 colaciones.  Evite los alimentos que usted nota que perjudican al beb.  Dixie Dials, jugos de fruta y agua para Patent examiner su sed (aproximadamente 8 vasos al Futures trader).   Descanse con frecuencia, reljese y tome sus vitaminas prenatales para evitar la fatiga, el estrs y la anemia.  Evite masticar y fumar tabaco.  Evite el consumo de alcohol y drogas.  Tome medicamentos de venta libre y recetados tal como le indic su mdico o Social research officer, government. Siempre debe consultar con su mdico o farmacutico antes de tomar cualquier medicamento, vitamina o suplemento de hierbas.  Sepa que durante la lactancia puede quedar embarazada. Si lo desea, hable con su mdico acerca de la planificacin familiar y los mtodos anticonceptivos seguros que puede utilizar durante la Market researcher. SOLICITE ATENCIN MDICA SI:   Usted siente que quiere dejar de Museum/gallery exhibitions officer o se siente frustrada con la lactancia.  Siente dolor en los senos o en los pezones.  Sus pezones estn agrietados o Water quality scientist.  Sus pechos estn irritados, sensibles o calientes.  Tiene un rea hinchada en cualquiera de los senos.  Siente escalofros o fiebre.  Tiene nuseas o vmitos.  Observa un drenaje en los pezones.  Sus mamas no se llenan antes de Marine scientist al 5to da despus del [place].  Se siente triste y deprimida.  El nio est  demasiado somnoliento como para comer.  El nio tiene problemas para Industrial/product designer.   Moja menos de 3 paales en 24 horas.  Mueve el intestino menos de 3 veces en 24 horas.  La piel del beb o la parte blanca de sus ojos est ms amarilla.   El beb no ha aumentado de [place] a los [address] de [place]. ASEGRESE DE QUE:   Comprende estas instrucciones.  Controlar su enfermedad.  Solicitar ayuda de inmediato si no mejora o si empeora. Document Released: 07/16/2005 Document Revised: 04/09/2012 [facility] Patient Information 2014 [place], [place].

## 2013-05-04 NOTE — H&P (Signed)
Pt c/o decreased fetal movement in the office and was sent for a BPP which showed a term fetus with a 4/8 BPP with very poor and extremely limited movement.  Given pts lack of sufficient prenatal care and concern for follow up will admit for IOL. Attestation of Attending Supervision of Fellow: Evaluation and management procedures were performed by the Fellow under my supervision and collaboration.  I have reviewed the Fellow's note and chart, and I agree with the management and plan.

## 2013-05-04 NOTE — Progress Notes (Signed)
Nutrition note: 1st visit consult Pt has gained 25.8# @ 40w, which is slightly > expected. Pt reports eating 3 meals/d. Pt is taking folic acid & iron supplements but no PNV. Pt reports no N/V but has some heartburn. Pt received verbal & written education on general nutrition during pregnancy/ breastfeeding. Discussed tips to decrease heartburn. Encouraged PNV/ multivitamin. Discussed benefits of BF. Pt agrees to continue taking folic acid & iron. Pt does not have WIC but plans to apply after birth. Pt plans to BF. F/u none needed-pt due today Blondell Reveal, MS, RD, LDN

## 2013-05-04 NOTE — Progress Notes (Signed)
Regina Shea, hospital interpreter, was at bedside to interpret for Dr. Reola Calkins.  Dr. Reola Calkins reviewed plan of care for method of induction, risks, benefits, and answered pt's and husband's questions.  Pt & her husband verbalized understanding with no further questions.

## 2013-05-04 NOTE — Progress Notes (Signed)
Hospital interpreter in to see if pt has questions; reassured pt.   RN answered pt's questions regarding further plan of care & fhr tracing and hospital interpreter communicated answers to pt.  Pt reassured, verbalizes understanding; has no further questions.

## 2013-05-04 NOTE — H&P (Signed)
Regina Shea is a 32 y.o. female presenting for IOL due to decreased fetal movement. Information gained from interviewing the patient was done via translator.  Maternal Medical History:  Reason for admission: Nausea. IOL due to decreased fetal movements.     OB History   Grav Para Term Preterm Abortions TAB SAB Ect Mult Living   3 2 2  0 0 0 0 0 0 2     Past Medical History  Diagnosis Date  . Asthma    Past Surgical History  Procedure Laterality Date  . No past surgeries     Family History: family history includes Hypertension in her father and mother. Social History:  reports that she has never smoked. She has never used smokeless tobacco. She reports that she does not drink alcohol or use illicit drugs.   Prenatal Transfer Tool  Maternal Diabetes: No Genetic Screening: Declined Maternal Ultrasounds/Referrals: Normal Fetal Ultrasounds or other Referrals:  None Maternal Substance Abuse:  No Significant Maternal Medications:  None Significant Maternal Lab Results:  None Other Comments:  None  Review of Systems  Constitutional: Negative for fever and chills.  HENT: Negative for sore throat.   Cardiovascular: Negative for chest pain and palpitations.  Gastrointestinal: Negative for heartburn, nausea, vomiting and abdominal pain.  Genitourinary: Negative for dysuria, urgency and frequency.  Skin: Negative.   Neurological: Negative for headaches.      Blood pressure 123/80, pulse 103, temperature 98.4 F (36.9 C), temperature source Oral, resp. rate 20, height 5' 1.02" (1.55 m), weight 80.287 kg (177 lb). Exam Physical Exam  Constitutional: She is oriented to person, place, and time. She appears well-developed and well-nourished. No distress.  HENT:  Head: Normocephalic and atraumatic.  Cardiovascular: Normal rate, regular rhythm, normal heart sounds and intact distal pulses.   Respiratory: Effort normal.  GI: Soft. Bowel sounds are normal.  Neurological:  She is alert and oriented to person, place, and time.  Skin: Skin is warm and dry. No rash noted. She is not diaphoretic. No erythema. No pallor.  Psychiatric: She has a normal mood and affect.    Prenatal labs: ABO, Rh: --/--/A POS (10/03 0941) Antibody:   Rubella: 2.30 (10/03 0941) RPR: NON REACTIVE (10/03 0941)  HBsAg: NEGATIVE (10/03 0941)  HIV: Non-reactive (10/03 0000)  GBS:     Assessment/Plan:  Patient received some prenatal care in [place]. She moved her in July and has remained. She presented for her first prenatal visit in the [place] on 05/01/13. She received all lab testing except GBS during that visit. Patient will receive a rapid GBS screen and then prophylactically treat as required. Patient is comfortable. She denies any complaint.   Patient will be given PO Cytotec. Patient wants epidural.   Bing Plume 05/04/2013, 12:55 PM  I have seen and examined this patient and agree with above documentation in the PA student's note.   Pt is a G3P2002 @ [redacted]w[redacted]d by LMP who presented today for an initial PNV after moving from [place].  She complained of slight decreased FM and was sent for [place] for placenta, limited anatomy and also BPP. BPP was 4/8 and thus she was admitted for IOL at term.  She has a beautiful category I tracing at time of admission.    * admit to L&D - routine orders - epidural prn  * IOL for BPP of 4/8 - foley bulb placed and inflated with 60cc of LR - cytotec PV also placed - will plan to then  start pit once foley bulb falls out  *FWB - cat I tracing - gbs unknown at term - will attempt collecting GBS PCR once in active labor  *anticipate SVD   Rulon Abide, M.D. [facility] Fellow 05/04/2013 6:50 PM

## 2013-05-04 NOTE — Progress Notes (Signed)
Dr. Malen Gauze explaining Epidural procedure, risks, benefits, to pt with [facility], hospital interpreter at bedside assisting with communication.  He answered pt's questions and she verbalized understanding to Dr. Malen Gauze & Gerre Couch, with this RN as a witness.

## 2013-05-04 NOTE — Addendum Note (Signed)
Addended by: Franchot Mimes on: 05/04/2013 09:38 AM   Modules accepted: Orders

## 2013-05-04 NOTE — Addendum Note (Signed)
Addended by: Sherre Lain A on: 05/04/2013 09:07 AM   Modules accepted: Orders

## 2013-05-04 NOTE — Progress Notes (Signed)
Regina Shea is a 32 y.o. G3P2002 at [redacted]w[redacted]d  admitted for IOL for BPP of 4/8.   Subjective:  Pt hurting more and more and wanting an epidural.  Foley bulb must came out. No LOF. +FM.   Objective: BP 108/66  Pulse 97  Temp(Src) 97.8 F (36.6 C) (Oral)  Resp 18  Ht 5' 1.02" (1.55 m)  Wt 80.287 kg (177 lb)  BMI 33.42 kg/m2  SpO2 100%      FHT:  FHR: 145 bpm, variability: moderate,  accelerations:  Present,  decelerations:  Present 3 variable decels after change in position with foley bulp removal UC:   irregular, every 2-6 minutes SVE:   Dilation: 4.5 Exam by:: LCarpenter,RN  Labs: Lab Results  Component Value Date   WBC 10.6* 05/04/2013   HGB 14.3 05/04/2013   HCT 42.2 05/04/2013   MCV 84.7 05/04/2013   PLT 245 05/04/2013    Assessment / Plan: IOL progressing s/p foley bulb and cytotec x 1.   Labor: will start pit @ 15mu/min and titrate up for contraction pattern improvement.  Fetal Wellbeing:  Category I Pain Control:  Labor support without medications I/D:  GBS + and on PCN Anticipated MOD:  NSVD  Pricilla Moehle L 05/04/2013, 9:34 PM

## 2013-05-04 NOTE — Progress Notes (Signed)
   Subjective:    Regina Shea is a [id number] [redacted]w[redacted]d being seen today for her first obstetrical visit.  Her obstetrical history is significant for insufficient prenatal care. 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 Patient does intend to breast feed. Pregnancy history fully reviewed. Patient reports uncomplicated prenatal care thus fat. Patient reports fetal movement throughout the day but not as strong as before  Patient reports occasional contractions.  There were no vitals filed for this visit.  HISTORY: OB History  Gravida Para Term Preterm AB SAB TAB Ectopic Multiple Living  3 2 2  0 0 0 0 0 0 2    # Outcome Date GA Lbr Len/2nd Weight Sex Delivery Anes PTL Lv  3 CUR           2 TRM 09/25/06 [redacted]w[redacted]d  6 lb (2.722 kg) M SVD   Y  1 TRM 01/15/05 [redacted]w[redacted]d  6 lb (2.722 kg) M SVD None  Y     Comments: cord wrapped around neck     Past Medical History  Diagnosis Date  . Asthma    Past Surgical History  Procedure Laterality Date  . No past surgeries     Family History  Problem Relation Age of Onset  . Hypertension Mother   . Hypertension Father      Exam    Uterus:     Pelvic Exam:    Perineum: Normal Perineum   Vulva: normal   Vagina:  normal mucosa, normal discharge   pH:    Cervix: 2/thick/post   Adnexa: not evaluated   Bony Pelvis: android  System: Breast:  normal appearance, no masses or tenderness   Skin: normal coloration and turgor, no rashes    Neurologic: oriented, no focal deficits   Extremities: normal strength, tone, and muscle mass   HEENT extra ocular movement intact   Mouth/Teeth mucous membranes moist, pharynx normal without lesions   Neck supple and no masses   Cardiovascular: regular rate and rhythm   Respiratory:  chest clear, no wheezing, crepitations, rhonchi, normal symmetric air entry   Abdomen: soft, gravid   Urinary:        Assessment:    Pregnancy: G3P2002 There are no active problems to display for this patient.       Plan:     Initial labs drawn on 10/3 1hr GCT today Prenatal vitamins. Problem list reviewed and updated. Genetic Screening discussed : too late.  Ultrasound discussed; fetal survey: ordered.  Follow up in a few days for post date testing. 50% of 30 min visit spent on counseling and coordination of care.     Mylynn Dinh 05/04/2013

## 2013-05-04 NOTE — Anesthesia Preprocedure Evaluation (Signed)
Anesthesia Evaluation  Patient identified by MRN, date of birth, ID band Patient awake    Reviewed: Allergy & Precautions, H&P , Patient's Chart, lab work & pertinent test results  Airway Mallampati: II TM Distance: >3 FB Neck ROM: full    Dental no notable dental hx.    Pulmonary neg pulmonary ROS, asthma ,  breath sounds clear to auscultation  Pulmonary exam normal       Cardiovascular negative cardio ROS  Rhythm:regular Rate:Normal     Neuro/Psych negative neurological ROS  negative psych ROS   GI/Hepatic negative GI ROS, Neg liver ROS,   Endo/Other  negative endocrine ROS  Renal/GU negative Renal ROS     Musculoskeletal   Abdominal   Peds  Hematology negative hematology ROS (+)   Anesthesia Other Findings   Reproductive/Obstetrics (+) Pregnancy                           Anesthesia Physical Anesthesia Plan  ASA: II  Anesthesia Plan: Epidural   Post-op Pain Management:    Induction:   Airway Management Planned:   Additional Equipment:   Intra-op Plan:   Post-operative Plan:   Informed Consent: I have reviewed the patients History and Physical, chart, labs and discussed the procedure including the risks, benefits and alternatives for the proposed anesthesia with the patient or authorized representative who has indicated his/her understanding and acceptance.     Plan Discussed with:   Anesthesia Plan Comments:         Anesthesia Quick Evaluation

## 2013-05-05 ENCOUNTER — Encounter: Payer: Self-pay | Admitting: Family

## 2013-05-05 ENCOUNTER — Encounter (HOSPITAL_COMMUNITY): Payer: Self-pay | Admitting: *Deleted

## 2013-05-05 DIAGNOSIS — O0930 Supervision of pregnancy with insufficient antenatal care, unspecified trimester: Secondary | ICD-10-CM

## 2013-05-05 DIAGNOSIS — O368190 Decreased fetal movements, unspecified trimester, not applicable or unspecified: Secondary | ICD-10-CM

## 2013-05-05 DIAGNOSIS — O9989 Other specified diseases and conditions complicating pregnancy, childbirth and the puerperium: Secondary | ICD-10-CM

## 2013-05-05 LAB — CULTURE, BETA STREP (GROUP B ONLY)

## 2013-05-05 MED ORDER — ONDANSETRON HCL 4 MG/2ML IJ SOLN: 4.0000 mg | INTRAMUSCULAR | Status: DC | PRN

## 2013-05-05 MED ORDER — SENNOSIDES-DOCUSATE SODIUM 8.6-50 MG PO TABS
2.0000 | ORAL_TABLET | ORAL | Status: DC
  Administered 2013-05-06 (×2): 2 via ORAL
  Filled 2013-05-05: qty 2

## 2013-05-05 MED ORDER — LANOLIN HYDROUS EX OINT: TOPICAL_OINTMENT | CUTANEOUS | Status: DC | PRN

## 2013-05-05 MED ORDER — IBUPROFEN 600 MG PO TABS
600.0000 mg | ORAL_TABLET | Freq: Four times a day (QID) | ORAL | Status: DC
  Administered 2013-05-05 – 2013-05-07 (×9): 600 mg via ORAL
  Filled 2013-05-05 (×9): qty 1

## 2013-05-05 MED ORDER — PRENATAL MULTIVITAMIN CH
1.0000 | ORAL_TABLET | Freq: Every day | ORAL | Status: DC
  Administered 2013-05-06 – 2013-05-07 (×2): 1 via ORAL
  Filled 2013-05-05 (×2): qty 1

## 2013-05-05 MED ORDER — PNEUMOCOCCAL VAC POLYVALENT 25 MCG/0.5ML IJ INJ
0.5000 mL | INJECTION | INTRAMUSCULAR | Status: AC
  Filled 2013-05-05: qty 0.5

## 2013-05-05 MED ORDER — OXYCODONE-ACETAMINOPHEN 5-325 MG PO TABS
1.0000 | ORAL_TABLET | ORAL | Status: DC | PRN
Start: 2013-05-05 — End: 2013-05-07
  Administered 2013-05-05 (×2): 1 via ORAL
  Filled 2013-05-05 (×2): qty 1

## 2013-05-05 MED ORDER — BENZOCAINE-MENTHOL 20-0.5 % EX AERO
1.0000 "application " | INHALATION_SPRAY | CUTANEOUS | Status: DC | PRN
  Administered 2013-05-05: 1 via TOPICAL
  Filled 2013-05-05: qty 56

## 2013-05-05 MED ORDER — TETANUS-DIPHTH-ACELL PERTUSSIS 5-2.5-18.5 LF-MCG/0.5 IM SUSP: 0.5000 mL | Freq: Once | INTRAMUSCULAR | Status: DC

## 2013-05-05 MED ORDER — DIPHENHYDRAMINE HCL 25 MG PO CAPS: 25.0000 mg | ORAL_CAPSULE | Freq: Four times a day (QID) | ORAL | Status: DC | PRN

## 2013-05-05 MED ORDER — WITCH HAZEL-GLYCERIN EX PADS: 1.0000 "application " | MEDICATED_PAD | CUTANEOUS | Status: DC | PRN

## 2013-05-05 MED ORDER — SIMETHICONE 80 MG PO CHEW: 80.0000 mg | CHEWABLE_TABLET | ORAL | Status: DC | PRN

## 2013-05-05 MED ORDER — ZOLPIDEM TARTRATE 5 MG PO TABS: 5.0000 mg | ORAL_TABLET | Freq: Every evening | ORAL | Status: DC | PRN

## 2013-05-05 MED ORDER — ONDANSETRON HCL 4 MG PO TABS: 4.0000 mg | ORAL_TABLET | ORAL | Status: DC | PRN

## 2013-05-05 MED ORDER — DIBUCAINE 1 % RE OINT: 1.0000 "application " | TOPICAL_OINTMENT | RECTAL | Status: DC | PRN

## 2013-05-05 NOTE — Progress Notes (Signed)
In-house interpreter Shanda Bumps called to interpret for education on mother and baby, and assist with interpretation of procedures and any questions patient had for herself, the baby, or preparing for discharge.

## 2013-05-05 NOTE — Progress Notes (Addendum)
Regina Shea is a 32 y.o. G3P2002 at [redacted]w[redacted]d  admitted for induction of labor due to BPP of 4/8.  Subjective:  Pt doing well. Comfortable with epidural.  No concerns.   Objective: BP 117/71  Pulse 87  Temp(Src) 99.3 F (37.4 C) (Oral)  Resp 18  Ht 5' 1.02" (1.55 m)  Wt 80.287 kg (177 lb)  BMI 33.42 kg/m2  SpO2 100%      FHT:  FHR: 145 bpm, variability: moderate,  accelerations:  Present,  decelerations:  Present variable decels often in conjunction with a contraction UC:   regular, every 2-4 minutes SVE:   Dilation: 4.5 Station: Ballotable Exam by:: K.Adiyah Lame,MD  Labs: Lab Results  Component Value Date   WBC 10.6* 05/04/2013   HGB 14.3 05/04/2013   HCT 42.2 05/04/2013   MCV 84.7 05/04/2013   PLT 245 05/04/2013    Assessment / Plan: IOL for BPP 4/8   Labor: foley bulb cervix, head more well applied, AROM performed with return of clear fluid and IUPC Placed Fetal Wellbeing:  Category II Pain Control:  Epidural I/D:  PCN for GBS + Anticipated MOD:  NSVD  Regina Shea 05/05/2013, 2:55 AM

## 2013-05-05 NOTE — Progress Notes (Signed)
Regina Shea is a 32 y.o. G3P2002 at [redacted]w[redacted]d  admitted for induction of labor due to [facility] 4/8.  Subjective:  Pt doing well. Baby with deep variables with contractions around 3:30 and pitocin was stopped. Contractions have spaced out since then. Doing well overall. Continuing to leak fluids. Some mild vaginal bleeding.  +FM  Objective: BP 123/78  Pulse 95  Temp(Src) 98.7 F (37.1 C) (Oral)  Resp 18  Ht 5' 1.02" (1.55 m)  Wt 80.287 kg (177 lb)  BMI 33.42 kg/m2  SpO2 100%      FHT:  FHR: 150 bpm, variability: moderate,  accelerations:  Present,  decelerations:  Present variables but improved from previously.  UC:   regular, every 3-5 minutes SVE:   Dilation: 8 Effacement (%): 90 Station: -1 Exam by:: Regina Hollopeter,MD  Labs: Lab Results  Component Value Date   WBC 10.6* 05/04/2013   HGB 14.3 05/04/2013   HCT 42.2 05/04/2013   MCV 84.7 05/04/2013   PLT 245 05/04/2013    Assessment / Plan: IOL due to bpp of 4/8 and off pit for the last 3 hours. Will restart now.     Labor: restart pit. cervix making change Fetal Wellbeing:  Category II Pain Control:  Epidural I/D:  GBS + on PCN Anticipated MOD:  NSVD  Caydin Yeatts L 05/05/2013, 6:55 AM

## 2013-05-06 ENCOUNTER — Other Ambulatory Visit: Payer: Self-pay

## 2013-05-06 MED ORDER — PNEUMOCOCCAL VAC POLYVALENT 25 MCG/0.5ML IJ INJ
0.5000 mL | INJECTION | INTRAMUSCULAR | Status: DC
  Filled 2013-05-06: qty 0.5

## 2013-05-06 NOTE — Plan of Care (Signed)
Problem: Consults Goal: Postpartum Patient Education (See Patient Education module for education specifics.)  Outcome: Progressing USING INTERPRETER

## 2013-05-06 NOTE — Lactation Note (Signed)
This note was copied from the chart of Regina Shea. Lactation Consultation Note  Patient Name: Regina Shea Date: 05/06/2013 Reason for consult: Follow-up assessment;Difficult latch [name], Spanish interpreter present off and on for visit. Mom reports baby is not latching with or without the nipple shield. She would like to latch baby without the nipple shield if possible. Mom reports when baby does latch using the nipple shield it is painful. On exam, Mom's nipples are erect but with short nipple shaft, some dimpling noted on the right nipple.  Hand expressed and Mom has colostrum easily expressed. Attempted to latch baby in cross cradle hold without the nipple shield, baby could not sustain a latch. Mom had been using #20 nipple shield. Changed to #24 to see if more comfortable fit for Mom. No improvement and baby had trouble organizing his suck with the larger nipple shield. Changed back to #20 nipple shield, hand expressed and demonstrated to Mom how to pre-load the nipple shield using a curved tipped syringe. After few attempts baby latched well, lips were well flanged and developed a good suckling pattern. Mom reports intermittent discomfort but some improvement. Baby nursed for 15 minutes off and on from the left breast, small amount of colostrum visible in the nipple shield at the end of the feeding. Baby starting giving feeding ques again. LC assisted Mom to latch baby in football hold on right breast using #20 nipple shield. Baby demonstrated a good rhythmic suck, scant amount of colostrum visible. Comfort gels given to Mom for nipple tenderness, no breakdown noted. Set up DEBP for Mom to pre-pump for approx 5 minutes, then post pump for 15 minutes on Preemie setting. Mom reported she agreed with plan. Advised to give baby back any amount of EBM she obtain with pumping. Advised to call for assist as needed till comfortable with latch.   Maternal Data     Feeding Feeding Type: Breast Fed Length of feed: 10 min (off and on using #20 nipple shield)  LATCH Score/Interventions Latch: Repeated attempts needed to sustain latch, nipple held in mouth throughout feeding, stimulation needed to elicit sucking reflex. (using #20 nipple shield) Intervention(s): Adjust position;Assist with latch;Breast massage;Breast compression  Audible Swallowing: None  Type of Nipple: Everted at rest and after stimulation (short nipple shaft) Intervention(s): Hand pump;Shells  Comfort (Breast/Nipple): Soft / non-tender     Hold (Positioning): Assistance needed to correctly position infant at breast and maintain latch. Intervention(s): Support Pillows;Position options  LATCH Score: 6  Lactation Tools Discussed/Used Tools: Shells;Pump Nipple shield size: 20;24 Shell Type: Inverted Breast pump type: Double-Electric Breast Pump Pump Review: Setup, frequency, and cleaning Initiated by:: KG Date initiated:: 05/06/13   Consult Status Consult Status: Follow-up Date: 05/06/13 Follow-up type: In-patient    Alfred Levins 05/06/2013, 11:59 AM

## 2013-05-06 NOTE — Progress Notes (Signed)
Post Partum Day #1 Subjective:  up ad lib, voiding and tolerating PO. No flatus or BM as of yet. Ambulating well.  Hospital translator in room for evaluation.   Objective: Blood pressure 101/66, pulse 72, temperature 97.6 F (36.4 C), temperature source Oral, resp. rate 18, height 5' 1.02" (1.55 m), weight 80.287 kg (177 lb), SpO2 97.00%, unknown if currently breastfeeding.  Physical Exam:  General: alert, cooperative and appears stated age Lochia: appropriate Uterine Fundus: firm Incision: N/A DVT Evaluation: No evidence of DVT seen on physical exam. Negative Homan's sign. No cords or calf tenderness. Patient has TTP, "electric" throughout her entire left leg and hip.    Recent Labs  05/04/13 1205  HGB 14.3  HCT 42.2    Assessment/Plan: Plan for discharge tomorrow, Breastfeeding, Lactation consult and Contraception Depo shot Will follow-up in [facility] for postpartum care and depo shot.   LOS: 2 days   Kuneff, Luster Landsberg 05/06/2013, 9:47 AM   Leg pain likely sciatica. Legs = on exam, no cords appreciated. If symptoms change or physical exam changes, will work up for DVT.   I spoke with and examined patient and agree with resident's note and plan of care.  Tawana Scale, MD OB Fellow 05/06/2013 2:12 PM

## 2013-05-06 NOTE — Lactation Note (Signed)
This note was copied from the chart of Regina Shea. Lactation Consultation Note  Patient Name: Regina Shea [id number] Date: 05/06/2013 Reason for consult: Follow-up assessment;Difficult latch;Breast/nipple pain Mom is continuing to have nipple pain with baby at the breast. Baby is chewing/biting when on the nipple shield, compression line visible with him at the breast. Attempted to change to larger (24 nipple shield) again but this is worse. Baby is humping his tongue and not flanging his lips making it difficult to obtain good depth when latching baby. He is very fussy this visit, becoming more frantic at the breast. Attempted to latch without the nipple shield but he could not sustain a latch. Mom has not pumped yet. She wants her baby to have some formula. Discussed plan with Mom and due to her sore nipples we decided to bottle feed some formula. Advised Mom to give baby 15-20 ml every 3 hours via bottle with slow flow nipple. Suck training/paced feeding discussed and demonstrated to Mom.  She is to pump every 3 hours for 15 minutes on preemie setting. Mom pumped at this visit and denied discomfort. She did not receive any EBM. She reports her milk usually comes in the 3rd day. Could not get All Purpose Nipple Cream from pharmacy. Left sticky note on Mom's chart for OB to call this to the pharmacy [facility]) for Mom before d/c tomorrow so she can pick this up on her way home. Mom did not want it called in tonight to be picked up by family. Mom has comfort gels. Mom is not registered with WIC. Advised to call WIC in am to set up appointment. [name], Spanish interpreter present for this visit.   Maternal Data    Feeding Feeding Type: Formula Length of feed: 10 min (off and on)  LATCH Score/Interventions Latch: Repeated attempts needed to sustain latch, nipple held in mouth throughout feeding, stimulation needed to elicit sucking reflex. Intervention(s): Skin to  skin;Teach feeding cues;Waking techniques Intervention(s): Adjust position;Assist with latch;Breast massage;Breast compression  Audible Swallowing: None Intervention(s): Skin to skin  Type of Nipple: Everted at rest and after stimulation (short nipple shaft bilateral) Intervention(s): Double electric pump  Comfort (Breast/Nipple): Filling, red/small blisters or bruises, mild/mod discomfort  Problem noted: Cracked, bleeding, blisters, bruises;Severe discomfort Interventions  (Cracked/bleeding/bruising/blister): Double electric pump;Expressed breast milk to nipple Interventions (Mild/moderate discomfort): Comfort gels  Hold (Positioning): Assistance needed to correctly position infant at breast and maintain latch. Intervention(s): Skin to skin;Support Pillows;Position options  LATCH Score: 5  Lactation Tools Discussed/Used Tools: Nipple Shields;Pump;Comfort gels Nipple shield size: 20;24 Breast pump type: Double-Electric Breast Pump WIC Program: No   Consult Status Date: 05/07/13 Follow-up type: In-patient    Alfred Levins 05/06/2013, 5:00 PM

## 2013-05-06 NOTE — Progress Notes (Signed)
Ur chart review completed.  

## 2013-05-07 MED ORDER — BENZOCAINE-MENTHOL 20-0.5 % EX AERO: 1.0000 "application " | INHALATION_SPRAY | CUTANEOUS | Status: DC | PRN

## 2013-05-07 MED ORDER — WITCH HAZEL-GLYCERIN EX PADS: 1.0000 "application " | MEDICATED_PAD | CUTANEOUS | Status: DC | PRN

## 2013-05-07 MED ORDER — IBUPROFEN 600 MG PO TABS: 600.0000 mg | ORAL_TABLET | Freq: Four times a day (QID) | ORAL | Status: DC

## 2013-05-07 MED ORDER — PNEUMOCOCCAL VAC POLYVALENT 25 MCG/0.5ML IJ INJ
0.5000 mL | INJECTION | INTRAMUSCULAR | Status: DC
  Filled 2013-05-07: qty 0.5

## 2013-05-07 MED ORDER — PNEUMOCOCCAL VAC POLYVALENT 25 MCG/0.5ML IJ INJ
0.5000 mL | INJECTION | Freq: Once | INTRAMUSCULAR | Status: AC
  Administered 2013-05-07: 0.5 mL via INTRAMUSCULAR
  Filled 2013-05-07: qty 0.5

## 2013-05-07 MED ORDER — DIBUCAINE 1 % RE OINT: 1.0000 "application " | TOPICAL_OINTMENT | RECTAL | Status: DC | PRN

## 2013-05-07 MED ORDER — LANOLIN HYDROUS EX OINT: 1.0000 "application " | TOPICAL_OINTMENT | CUTANEOUS | Status: DC | PRN

## 2013-05-07 NOTE — MAU Provider Note (Signed)
Attestation of Attending Supervision of Obstetric Fellow: Evaluation and management procedures were performed by the Obstetric Fellow under my supervision and collaboration.  I have reviewed the Obstetric Fellow's note and chart, and I agree with the management and plan.  Mirenda Baltazar, MD, FACOG Attending Obstetrician & Gynecologist Faculty Practice, [facility]   

## 2013-05-07 NOTE — Discharge Summary (Signed)
Obstetric Discharge Summary Reason for Admission: induction of labor for decreased fetal movement Prenatal Procedures: ultrasound Intrapartum Procedures: spontaneous vaginal delivery Postpartum Procedures: none Complications-Operative and Postpartum: none Hemoglobin  Date Value Range Status  05/04/2013 14.3  12.0 - 15.0 g/dL Final     HCT  Date Value Range Status  05/04/2013 42.2  36.0 - 46.0 % Final   Regina Shea is a 32 y.o. female presenting for IOL due to decreased fetal movement.   Physical Exam:  General: alert and cooperative Lochia: appropriate Uterine Fundus: firm Incision: N/A DVT Evaluation: No evidence of DVT seen on physical exam. Negative Homan's sign. No cords or calf tenderness.  Discharge Diagnoses: Term Pregnancy-delivered  Discharge Information: Date: 05/07/2013 Activity: pelvic rest Diet: routine Medications: Ibuprofen Condition: stable Instructions: refer to practice specific booklet Discharge to: home   Newborn Data: Live born female  Birth Weight: 7 lb 13.2 oz (3550 g) APGAR: 8, 9 Breast feeding   Home with mother.  Felix Pacini 05/07/2013, 11:16 AM  I have seen and examined this patient and agree with above documentation in the resident's note. Pt presented for BPP of 4/8 at term and IOL. She progressed and delivered a liveborn female without complications. She is breast feeding and plans for depo for birth control.  Pt to present to clinic for this injection and flag sent.   Rulon Abide, M.D. [facility] Fellow 05/07/2013 3:55 PM

## 2013-05-07 NOTE — Anesthesia Postprocedure Evaluation (Signed)
  Anesthesia Post Note  Patient: Regina Shea  Procedure(s) Performed: * No procedures listed *  Anesthesia type: Epidural  Patient location: Mother/Baby  Post pain: Pain level controlled  Post assessment: Post-op Vital signs reviewed  Last Vitals: There were no vitals filed for this visit.  Post vital signs: Reviewed  Level of consciousness: awake  Complications: No apparent anesthesia complications by review of chart

## 2013-05-07 NOTE — Lactation Note (Signed)
This note was copied from the chart of Regina Livi Sanchez-Herrera. Lactation Consultation Note  Patient Name: Regina Shea [id number] Date: 05/07/2013 Reason for consult: Follow-up assessment;Difficult latch Mom has been bottle feeding due to nipple pain with breastfeeding. This morning Mom wants to attempt BF again with nipple shield. Assisted Mom with positioning, demonstrated how to pre-load nipple shield with formula using curved tipped syringe. Baby latched after few attempts, Mom had discomfort with the initial latch but as baby became deeper with the latch, lips well flanged Mom reported very little pain. Baby nursed with good suckling pattern for about 5 minutes then fell asleep. No colostrum in the nipple shield. With suck exam, baby is still thrusting his tongue, but does develop a better pattern after few suckles. Advised Mom to continue suck training as we demonstrated yesterday using the bottle via slow flow nipple. At this time the plan is for Mom to put baby to the breast every 2-3 hours or whenever he give feeding ques using #20 nipple shield, continue to supplement till Mom's milk comes in according to guidelines given to Mom increasing each day the volume till Mom's milk comes in and baby is consistently breastfeeding with evidence of milk transfer by observing milk in nipple shield, void/stools, weight check.  FOB to give supplement while Mom uses hand pump to post pump for 15 minutes after feedings or at least every 3 hours.  Mom will use hand pump or parents will purchase pump. Advised to make OP follow up for next week. Mom will call. Engorgement care reviewed if needed. [name] Royal, Spanish interpreter present for visit.   Maternal Data    Feeding Feeding Type: Breast Fed Nipple Type: Slow - flow Length of feed: 5 min  LATCH Score/Interventions Latch: Repeated attempts needed to sustain latch, nipple held in mouth throughout feeding, stimulation needed to elicit  sucking reflex.  Audible Swallowing: A few with stimulation  Type of Nipple: Everted at rest and after stimulation (short nipple shaft) Intervention(s): Hand pump  Comfort (Breast/Nipple): Engorged, cracked, bleeding, large blisters, severe discomfort  Problem noted: Severe discomfort (with breastfeeding) Interventions  (Cracked/bleeding/bruising/blister): Hand pump;Expressed breast milk to nipple Interventions (Mild/moderate discomfort): Comfort gels  Hold (Positioning): Assistance needed to correctly position infant at breast and maintain latch.  LATCH Score: 5  Lactation Tools Discussed/Used Tools: Nipple Shields;Pump;Comfort gels Nipple shield size: 20;24 Breast pump type: Manual WIC Program: No   Consult Status Consult Status: Complete Date: 05/07/13 Follow-up type: In-patient    Regina Shea 05/07/2013, 11:57 AM

## 2013-05-08 ENCOUNTER — Encounter: Payer: Self-pay | Admitting: *Deleted

## 2013-05-23 ENCOUNTER — Emergency Department (HOSPITAL_COMMUNITY)
Admission: EM | Admit: 2013-05-23 | Discharge: 2013-05-24 | Disposition: A | Discharge status: Home / Self Care | Payer: Medicaid Other | Attending: Emergency Medicine | Admitting: Emergency Medicine

## 2013-05-23 ENCOUNTER — Encounter (HOSPITAL_COMMUNITY): Payer: Self-pay | Admitting: Emergency Medicine

## 2013-05-23 DIAGNOSIS — J45909 Unspecified asthma, uncomplicated: Secondary | ICD-10-CM | POA: Insufficient documentation

## 2013-05-23 DIAGNOSIS — R509 Fever, unspecified: Secondary | ICD-10-CM | POA: Insufficient documentation

## 2013-05-23 DIAGNOSIS — L02419 Cutaneous abscess of limb, unspecified: Secondary | ICD-10-CM | POA: Insufficient documentation

## 2013-05-23 DIAGNOSIS — L03116 Cellulitis of left lower limb: Secondary | ICD-10-CM

## 2013-05-23 MED ORDER — ACETAMINOPHEN 325 MG PO TABS
650.0000 mg | ORAL_TABLET | Freq: Once | ORAL | Status: AC
  Administered 2013-05-24: 650 mg via ORAL
  Filled 2013-05-23: qty 2

## 2013-05-23 NOTE — ED Provider Notes (Signed)
CSN: [id number]     Arrival date & time 05/23/13  2229 History   First MD Initiated Contact with Patient 05/23/13 2300     Chief Complaint  Patient presents with  . Cellulitis   HPI  History provided by the patient). Patient is a 32 year old Hispanic female with history of recent pregnancy and vaginal delivery who presents with complaints of redness, pain and swelling to her left lower leg. Symptoms first began yesterday around the shin with redness followed by worsening pain and tenderness with a red streak up the leg. Patient has reported subjective fevers and chills at home. She did not take her temperature. She did use Tylenol yesterday which helped with symptoms. She denies any injury to the skin. She is unsure of any insect bite or other trauma. She denies any associated nausea or vomiting. Denies having similar symptoms previously. She is currently breast-feeding.     Past Medical History  Diagnosis Date  . Asthma    Past Surgical History  Procedure Laterality Date  . No past surgeries     Family History  Problem Relation Age of Onset  . Hypertension Mother   . Hypertension Father    History  Substance Use Topics  . Smoking status: Never Smoker   . Smokeless tobacco: Never Used  . Alcohol Use: No   OB History   Grav Para Term Preterm Abortions TAB SAB Ect Mult Living   3 3 3  0 0 0 0 0 0 3     Review of Systems  Constitutional: Positive for fever. Negative for chills and diaphoresis.  Respiratory: Negative for shortness of breath.   Cardiovascular: Negative for chest pain.    Allergies  Review of patient's allergies indicates no known allergies.  Home Medications  No current outpatient prescriptions on file. BP 124/77  Pulse 83  Temp(Src) 98.1 F (36.7 C) (Oral)  Resp 17  SpO2 98% Physical Exam  Nursing note and vitals reviewed. Constitutional: She is oriented to person, place, and time. She appears well-developed and well-nourished. No distress.  HENT:   Head: Normocephalic.  Cardiovascular: Normal rate and regular rhythm.   Pulmonary/Chest: Effort normal and breath sounds normal. No respiratory distress. She has no wheezes. She has no rales.  Abdominal: Soft.  Musculoskeletal: Normal range of motion.  Neurological: She is alert and oriented to person, place, and time.  Skin: Skin is warm and dry.  8 cm irregular area of erythema and slight induration to the anterior left lower leg and shin. There is an erythematous streaks to the medial aspect of the leg extending to the thigh. Local tenderness to palpation. No circumferential swelling or tenderness. Normal distal pulses and sensation in the foot  Psychiatric: She has a normal mood and affect. Her behavior is normal.    ED Course  Procedures   COORDINATION OF CARE:  Nursing notes reviewed. Vital signs reviewed. Initial pt interview and examination performed.   11:20PM patient seen and evaluated. She is well-appearing no acute distress. Does not appear severely ill or toxic. She is afebrile. No treatments today for symptoms. Discussed work up plan with pt at bedside, which includes CBC with plans for outpatient antibiotic treatment. Pt agrees with plan.   Results for orders placed during the hospital encounter of 05/23/13  CBC WITH DIFFERENTIAL      Result Value Range   WBC 10.9 (*) 4.0 - 10.5 K/uL   RBC 4.94  3.87 - 5.11 MIL/uL   Hemoglobin 14.5  12.0 -  15.0 g/dL   HCT 40.9  81.1 - 91.4 %   MCV 87.2  78.0 - 100.0 fL   MCH 29.4  26.0 - 34.0 pg   MCHC 33.6  30.0 - 36.0 g/dL   RDW 78.2  95.6 - 21.3 %   Platelets 282  150 - 400 K/uL   Neutrophils Relative % 53  43 - 77 %   Neutro Abs 5.8  1.7 - 7.7 K/uL   Lymphocytes Relative 35  12 - 46 %   Lymphs Abs 3.8  0.7 - 4.0 K/uL   Monocytes Relative 10  3 - 12 %   Monocytes Absolute 1.1 (*) 0.1 - 1.0 K/uL   Eosinophils Relative 1  0 - 5 %   Eosinophils Absolute 0.2  0.0 - 0.7 K/uL   Basophils Relative 1  0 - 1 %   Basophils Absolute  0.1  0.0 - 0.1 K/uL     Imaging Review No results found.  EKG Interpretation   None       MDM   1. Cellulitis of left lower leg        Angus Seller, PA-C 05/24/13 0022

## 2013-05-23 NOTE — ED Notes (Signed)
Pt in c/o redness and pain to her lower left leg and left inner thigh, states symptoms started yesterday, also c/o fever with this

## 2013-05-23 NOTE — ED Notes (Signed)
Pt presents with redness to L leg. States she has been having a fever but has no fever at present.

## 2013-05-24 LAB — CBC WITH DIFFERENTIAL/PLATELET
Basophils Absolute: 0.1 10*3/uL (ref 0.0–0.1)
Eosinophils Relative: 1 % (ref 0–5)
HCT: 43.1 % (ref 36.0–46.0)
Hemoglobin: 14.5 g/dL (ref 12.0–15.0)
Lymphocytes Relative: 35 % (ref 12–46)
Lymphs Abs: 3.8 10*3/uL (ref 0.7–4.0)
MCH: 29.4 pg (ref 26.0–34.0)
MCV: 87.2 fL (ref 78.0–100.0)
Monocytes Relative: 10 % (ref 3–12)
Platelets: 282 10*3/uL (ref 150–400)
RBC: 4.94 MIL/uL (ref 3.87–5.11)
RDW: 12.9 % (ref 11.5–15.5)
WBC: 10.9 10*3/uL — ABNORMAL HIGH (ref 4.0–10.5)

## 2013-05-24 MED ORDER — CEPHALEXIN 500 MG PO CAPS: 500.0000 mg | ORAL_CAPSULE | Freq: Four times a day (QID) | ORAL | Status: DC

## 2013-05-24 NOTE — ED Provider Notes (Signed)
Medical screening examination/treatment/procedure(s) were performed by non-physician practitioner and as supervising physician I was immediately available for consultation/collaboration.    Sritha Chauncey M Ikaika Showers, MD 05/24/13 0116 

## 2013-06-04 ENCOUNTER — Ambulatory Visit: Payer: Self-pay | Admitting: Family

## 2014-05-31 ENCOUNTER — Encounter (HOSPITAL_COMMUNITY): Payer: Self-pay | Admitting: Emergency Medicine

## 2015-07-31 NOTE — L&D Delivery Note (Signed)
Delivery Note At 11:34 AM a viable female was delivered via Vaginal, Spontaneous Delivery augmented with AROM & Pit  (Presentation: cephalic).  APGAR: 9, 9;  Placenta status: intact, delivered with gentle traction. Cord: 3 vessel, nuchal x1   Anesthesia:  epidural Episiotomy: None Lacerations: 1st degree Suture Repair: no repair nececessar Est. Blood Loss (mL):  100  Mom to postpartum.  Baby to Couplet care / Skin to Skin.  All instruments counted and gauze accounted for, needles disposed of.  Casimer Leek, MD, PGY-1, MPH 02/21/2016, 11:49 AM  OB FELLOW DELIVERY ATTESTATION  I was gloved and present for the delivery in its entirety, and I agree with the above resident's note.    Jacquiline Doe, MD 2:22 PM

## 2016-02-13 ENCOUNTER — Inpatient Hospital Stay (HOSPITAL_COMMUNITY)
Admission: AD | Admit: 2016-02-13 | Discharge: 2016-02-13 | Disposition: A | Discharge status: Home / Self Care | Payer: Medicaid Other | Source: Ambulatory Visit | Attending: Obstetrics and Gynecology | Admitting: Obstetrics and Gynecology

## 2016-02-13 ENCOUNTER — Encounter (HOSPITAL_COMMUNITY): Payer: Self-pay | Admitting: *Deleted

## 2016-02-13 DIAGNOSIS — O4703 False labor before 37 completed weeks of gestation, third trimester: Secondary | ICD-10-CM

## 2016-02-13 DIAGNOSIS — J45909 Unspecified asthma, uncomplicated: Secondary | ICD-10-CM | POA: Insufficient documentation

## 2016-02-13 DIAGNOSIS — O479 False labor, unspecified: Secondary | ICD-10-CM

## 2016-02-13 DIAGNOSIS — B951 Streptococcus, group B, as the cause of diseases classified elsewhere: Secondary | ICD-10-CM | POA: Diagnosis present

## 2016-02-13 DIAGNOSIS — O99513 Diseases of the respiratory system complicating pregnancy, third trimester: Secondary | ICD-10-CM | POA: Insufficient documentation

## 2016-02-13 DIAGNOSIS — Z3A35 35 weeks gestation of pregnancy: Secondary | ICD-10-CM | POA: Insufficient documentation

## 2016-02-13 LAB — WET PREP, GENITAL
CLUE CELLS WET PREP: NONE SEEN
Sperm: NONE SEEN
Trich, Wet Prep: NONE SEEN
YEAST WET PREP: NONE SEEN

## 2016-02-13 LAB — OB RESULTS CONSOLE GC/CHLAMYDIA: Gonorrhea: NEGATIVE

## 2016-02-13 LAB — URINALYSIS, ROUTINE W REFLEX MICROSCOPIC
Bilirubin Urine: NEGATIVE
Glucose, UA: NEGATIVE mg/dL
Ketones, ur: NEGATIVE mg/dL
Nitrite: NEGATIVE
PROTEIN: NEGATIVE mg/dL
Specific Gravity, Urine: >1.03 — ABNORMAL HIGH (ref 1.005–1.030)
pH: 6 (ref 5.0–8.0)

## 2016-02-13 LAB — DIFFERENTIAL
BASOS PCT: 0 %
Basophils Absolute: 0 10*3/uL (ref 0.0–0.1)
EOS ABS: 0.1 10*3/uL (ref 0.0–0.7)
EOS PCT: 1 %
Lymphocytes Relative: 33 %
Lymphs Abs: 3.1 10*3/uL (ref 0.7–4.0)
Monocytes Absolute: 0.4 10*3/uL (ref 0.1–1.0)
Monocytes Relative: 5 %
NEUTROS PCT: 61 %
Neutro Abs: 5.8 10*3/uL (ref 1.7–7.7)

## 2016-02-13 LAB — CBC
HCT: 38.8 % (ref 36.0–46.0)
Hemoglobin: 13.4 g/dL (ref 12.0–15.0)
MCH: 29.2 pg (ref 26.0–34.0)
MCHC: 34.5 g/dL (ref 30.0–36.0)
MCV: 84.5 fL (ref 78.0–100.0)
PLATELETS: 214 10*3/uL (ref 150–400)
RBC: 4.59 MIL/uL (ref 3.87–5.11)
RDW: 14.2 % (ref 11.5–15.5)
WBC: 9.4 10*3/uL (ref 4.0–10.5)

## 2016-02-13 LAB — URINE MICROSCOPIC-ADD ON

## 2016-02-13 LAB — OB RESULTS CONSOLE GBS: GBS: POSITIVE

## 2016-02-13 NOTE — Discharge Instructions (Signed)
Informacin sobre Nurse, learning disability  (Preterm Labor Information) El parto prematuro comienza antes de la semana 63 de [place]. La duracin de un embarazo normal es de 39 a 41 semanas.  CAUSAS  Generalmente no hay una causa que pueda identificarse del motivo por el que una mujer comienza un trabajo de parto prematuro. Sin embargo, una de las causas conocidas ms frecuentes son las infecciones. Las infecciones del tero, el cuello, la vagina, el lquido [place], la vejiga, los riones y Nurse, children's de los pulmones (neumona) pueden hacer que el trabajo de parto se inicie. Otras causas que pueden sospecharse son:   Infecciones urogenitales, como infecciones por hongos y vaginosis bacteriana.   Anormalidades uterinas (forma del tero, sptum uterino, fibromas, hemorragias en la placenta).   Un cuello que ha sido operado (puede ser que no permanezca cerrado).   Malformaciones del feto.   Gestaciones mltiples (mellizos, trillizos y ms).   [place] previa de parto prematuro.   Tener ruptura prematura de las membranas (RPM).   La placenta cubre la abertura del cuello (placenta previa).   La placenta se separa del tero (abrupcin placentaria).   El cuello es demasiado dbil para contener al beb en el tero (cuello incompetente).   Hay mucho lquido en el saco amnitico (polihidramnios).   Consumo de drogas o hbito de fumar durante Water quality scientist.   No aumentar de peso lo suficiente durante el [employer].   Mujeres menores de 18 aos o mayores de 35 aos.   Nivel socioeconmico bajo.   Pertenecer a Advertising copywriter. SNTOMAS  Los signos y sntomas del trabajo de parto prematuro son:   Wellsite geologist similares a los Agricultural engineer, dolor abdominal o dolor de espalda.  Contracciones uterinas regulares, tan frecuentes como seis por hora, sin importar su intensidad (pueden ser suaves o dolorosas).  Contracciones que comienzan  en la parte superior del tero y se expanden hacia abajo, a la zona inferior del abdomen y la espalda.   Sensacin de aumento de presin en la pelvis.   Aparece una secrecin acuosa o sanguinolenta por la vagina.  TRATAMIENTO  Segn el tiempo del embarazo y otras [place], el mdico puede indicar reposo en cama. Si es necesario, le indicarn medicamentos para Scientist, water quality las contracciones y para Neurosurgeon los pulmones del feto. Si el trabajo de parto se inicia antes de las 34 semanas de [place], se recomienda la hospitalizacin. El tratamiento depende de las condiciones en que se encuentren usted y el feto.  QU DEBE HACER SI PIENSA QUE EST EN TRABAJO DE PARTO PREMATURO?  Comunquese con su mdico inmediatamente. Debe concurrir al hospital para ser controlada inmediatamente.  [place] EN FUTUROS EMBARAZOS?  Usted debe:   Si fuma, abandonar el hbito.  Mantener un peso saludable y evitar sustancias qumicas y drogas innecesarias.  Controlar todo tipo de infeccin.  Informe a su mdico si tiene una historia conocida de trabajo de parto prematuro.   Esta informacin no tiene Marine scientist el consejo del mdico. Asegrese de hacerle al mdico cualquier pregunta que tenga.   Document Released: 10/23/2007 Document Revised: 03/18/2013 Elsevier Interactive Patient Education 2016 [place] (Braxton Hicks Contractions) Durante el [place], pueden presentarse contracciones uterinas que no siempre indican que est en [place].  QU SON LAS CONTRACCIONES DE BRAXTON HICKS?  Las [employer] se presentan antes del [place] de parto se conocen como contracciones  de Braxton Hicks o falso trabajo de [place]. Hacia el final del embarazo (32 a 34semanas), estas contracciones pueden aparecen con ms frecuencia y volverse ms intensas. No corresponden al [place] de parto verdadero porque estas contracciones  no producen el agrandamiento (la dilatacin) y el afinamiento del cuello del tero. Algunas veces, es difcil distinguirlas del trabajo de parto verdadero porque en algunos casos pueden ser [employer], y las personas tienen diferentes niveles de tolerancia al [employer]. No debe sentirse avergonzada si concurre al hospital con falso trabajo de [place]. En ocasiones, la nica forma de saber si el trabajo de parto es verdadero es que el mdico determine si hay cambios en el cuello del tero. Si no hay problemas prenatales u otras complicaciones de salud asociadas con el embarazo, no habr inconvenientes si la envan a su casa con falso trabajo de parto y espera que comience el verdadero. [place] DEL VERDADERO Falso trabajo de parto  Las contracciones del falso trabajo de parto duran menos y no son tan intensas como las verdaderas.  Generalmente son irregulares.  A menudo, se sienten en la parte delantera de la parte baja del abdomen y en la ingle,  y pueden desaparecer cuando camina o cambia de posicin mientras est acostada.  Las contracciones se vuelven ms dbiles y su duracin es Garment/textile technologist a medida que el tiempo transcurre.  Por lo general, no se hacen progresivamente ms intensas, regulares y Magazine features editor entre s como en el caso del [place] de parto verdadero. Antionette Fairy de parto  Las contracciones del verdadero trabajo de parto duran de 43 a 70segundos, son muy regulares y suelen volverse ms intensas, y [place] su frecuencia.  No desaparecen cuando camina.  La molestia generalmente se siente en la parte superior del tero y se extiende hacia la zona inferior del abdomen y [employer] cintura.  El mdico podr examinarla para determinar si el trabajo de parto es verdadero. El examen mostrar si el cuello del tero se est dilatando y [place]. LO QUE DEBE RECORDAR  Contine haciendo los ejercicios habituales y siga otras indicaciones que el mdico le  d.  Tome todos los medicamentos como le indic el mdico.  Consulting civil engineer a las visitas prenatales regulares.  Coma y beba con moderacin si cree que est en trabajo de parto.  Si las contracciones de [employer] provocan incomodidad:  [place] de posicin: si est acostada o descansando, camine; si est caminando, descanse.  Sintese y descanse en una baera con agua tibia.  Beba 2 o 3vasos de [place]. La deshidratacin puede provocar contracciones.  Respire lenta y profundamente varias veces por hora. [place]? Solicite atencin mdica de inmediato si:  Las contracciones se intensifican, se hacen ms regulares y Industrial/product designer s.  Tiene una prdida de lquido por la vagina.  Tiene fiebre.  Elimina mucosidad manchada con [place].  Tiene una hemorragia vaginal abundante.  Tiene dolor abdominal permanente.  Tiene un dolor en la zona lumbar que nunca tuvo antes.  Siente que la cabeza del beb empuja hacia abajo y ejerce presin en la zona plvica.  El beb no se mueve Administrator, Civil Service.   Esta informacin no tiene Marine scientist el consejo del mdico. Asegrese de hacerle al mdico cualquier pregunta que tenga.   Document Released: 04/25/2005 Document Revised: 07/21/2013 Elsevier Interactive Patient Education [employer].

## 2016-02-13 NOTE — MAU Note (Signed)
Pt reports pressure, contractions and back pain x one week. Denies bleeding or ROM. Last prenatal care was 2 months ago.

## 2016-02-13 NOTE — MAU Provider Note (Signed)
History     CSN: [id number]  Arrival date and time: 02/13/16 [id number]   First Provider Initiated Contact with Patient 02/13/16 2030      Chief Complaint  Patient presents with  . Pelvic Pain   HPI Comments: Regina Shea is a 35 y.o. [id number] at [redacted]w[redacted]d who presents today with lower abdominal pain. She states that she has had the pain for about one month, and it has been getting worse. She denies any VB or LOF. She reports decreased fetal movement today, but since being here she has been feeling normal fetal movement. She has not had any PNC here in the [place]. She has had regular prenatal care in [place]. She denies any complications with this pregnancy or any of her other pregnancies. She does report that she had a glucola done, and that it was "normal". No records available for review.   Pelvic Pain The patient's primary symptoms include pelvic pain. This is a new problem. The current episode started 1 to 4 weeks ago. The problem occurs intermittently. The problem has been unchanged. Pain severity now: 8/10  The problem affects both sides. She is pregnant. Associated symptoms include abdominal pain. Pertinent negatives include no chills, constipation, diarrhea, dysuria, fever, frequency, nausea, urgency or vomiting. The vaginal discharge was normal. There has been no bleeding. Nothing aggravates the symptoms. She has tried nothing for the symptoms.    Past Medical History  Diagnosis Date  . Asthma     Past Surgical History  Procedure Laterality Date  . No past surgeries      Family History  Problem Relation Age of Onset  . Hypertension Mother   . Hypertension Father     Social History  Substance Use Topics  . Smoking status: Never Smoker   . Smokeless tobacco: Never Used  . Alcohol Use: No    Allergies: No Known Allergies  Prescriptions prior to admission  Medication Sig Dispense Refill Last Dose  . ferrous sulfate 325 (65 FE) MG tablet Take 325 mg by mouth daily  with breakfast.   02/13/2016 at Unknown time  . Prenatal Vit-Fe Fumarate-FA (PRENATAL MULTIVITAMIN) TABS tablet Take 1 tablet by mouth daily at 12 noon.   02/13/2016 at Unknown time    Review of Systems  Constitutional: Negative for fever and chills.  Gastrointestinal: Positive for abdominal pain. Negative for nausea, vomiting, diarrhea and constipation.  Genitourinary: Positive for pelvic pain. Negative for dysuria, urgency and frequency.   Physical Exam   Blood pressure 111/64, pulse 82, temperature 97.9 F (36.6 C), temperature source Oral, resp. rate 18, height 5\' 2"  (1.575 m), weight 177 lb (80.287 kg), SpO2 99 %, unknown if currently breastfeeding.  Physical Exam  Nursing note and vitals reviewed. Constitutional: She is oriented to person, place, and time. She appears well-developed and well-nourished. No distress.  HENT:  Head: Normocephalic.  Cardiovascular: Normal rate.   Respiratory: Effort normal.  GI: Soft. There is no tenderness. There is no rebound.  Genitourinary:  Cervix: 1.5/50/-2/vtx  Neurological: She is alert and oriented to person, place, and time.  Skin: Skin is warm and dry.  Psychiatric: She has a normal mood and affect.  FHT: 130, moderate with 15x15 accels, no decels Toco: irregular UCs   Results for orders placed or performed during the hospital encounter of 02/13/16 (from the past 24 hour(s))  Urinalysis, Routine w reflex microscopic (not at [facility])     Status: Abnormal   Collection Time: 02/13/16  7:48 PM  Result Value  Ref Range   Color, Urine YELLOW YELLOW   APPearance CLEAR CLEAR   Specific Gravity, Urine >1.030 (H) 1.005 - 1.030   pH 6.0 5.0 - 8.0   Glucose, UA NEGATIVE NEGATIVE mg/dL   Hgb urine dipstick TRACE (A) NEGATIVE   Bilirubin Urine NEGATIVE NEGATIVE   Ketones, ur NEGATIVE NEGATIVE mg/dL   Protein, ur NEGATIVE NEGATIVE mg/dL   Nitrite NEGATIVE NEGATIVE   Leukocytes, UA MODERATE (A) NEGATIVE  Urine microscopic-add on     Status:  Abnormal   Collection Time: 02/13/16  7:48 PM  Result Value Ref Range   Squamous Epithelial / LPF 6-30 (A) NONE SEEN   WBC, UA 6-30 0 - 5 WBC/hpf   RBC / HPF 0-5 0 - 5 RBC/hpf   Bacteria, UA FEW (A) NONE SEEN   Crystals CA OXALATE CRYSTALS (A) NEGATIVE   Urine-Other MUCOUS PRESENT   Wet prep, genital     Status: Abnormal   Collection Time: 02/13/16  8:40 PM  Result Value Ref Range   Yeast Wet Prep HPF POC NONE SEEN NONE SEEN   Trich, Wet Prep NONE SEEN NONE SEEN   Clue Cells Wet Prep HPF POC NONE SEEN NONE SEEN   WBC, Wet Prep HPF POC MANY (A) NONE SEEN   Sperm NONE SEEN   CBC     Status: None   Collection Time: 02/13/16  9:00 PM  Result Value Ref Range   WBC 9.4 4.0 - 10.5 K/uL   RBC 4.59 3.87 - 5.11 MIL/uL   Hemoglobin 13.4 12.0 - 15.0 g/dL   HCT 38.8 36.0 - 46.0 %   MCV 84.5 78.0 - 100.0 fL   MCH 29.2 26.0 - 34.0 pg   MCHC 34.5 30.0 - 36.0 g/dL   RDW 14.2 11.5 - 15.5 %   Platelets 214 150 - 400 K/uL  Differential     Status: None   Collection Time: 02/13/16  9:00 PM  Result Value Ref Range   Neutrophils Relative % 61 %   Neutro Abs 5.8 1.7 - 7.7 K/uL   Lymphocytes Relative 33 %   Lymphs Abs 3.1 0.7 - 4.0 K/uL   Monocytes Relative 5 %   Monocytes Absolute 0.4 0.1 - 1.0 K/uL   Eosinophils Relative 1 %   Eosinophils Absolute 0.1 0.0 - 0.7 K/uL   Basophils Relative 0 %   Basophils Absolute 0.0 0.0 - 0.1 K/uL     MAU Course  Procedures  MDM 2200: No cervical change  Assessment and Plan   1. Braxton Hicks contractions   2. [redacted] weeks gestation of pregnancy    DC home Comfort measures reviewed  3rd Trimester precautions  PTL precautions  Fetal kick counts RX: none  Return to MAU as needed   Follow-up Information    Follow up with [facility].   Specialty:  Obstetrics and Gynecology   Why:  They will call you with an appointment   Contact information:   [place] [phone]       Follow up with [facility].   Specialty:  Radiology   Why:  They will call you with an appointment   Contact information:   [address] [id number] [place] [phone]       Mathis Bud 02/13/2016, 8:40 PM

## 2016-02-14 DIAGNOSIS — B951 Streptococcus, group B, as the cause of diseases classified elsewhere: Secondary | ICD-10-CM | POA: Diagnosis present

## 2016-02-14 LAB — CULTURE, BETA STREP (GROUP B ONLY)

## 2016-02-14 LAB — HIV ANTIBODY (ROUTINE TESTING W REFLEX): HIV SCREEN 4TH GENERATION: NONREACTIVE

## 2016-02-14 LAB — GC/CHLAMYDIA PROBE AMP (~~LOC~~) NOT AT ARMC
CHLAMYDIA, DNA PROBE: NEGATIVE
NEISSERIA GONORRHEA: NEGATIVE

## 2016-02-14 LAB — HEPATITIS B SURFACE ANTIGEN: HEP B S AG: NEGATIVE

## 2016-02-14 LAB — RPR: RPR: NONREACTIVE

## 2016-02-15 LAB — CULTURE, OB URINE

## 2016-02-15 LAB — HEMOGLOBIN A1C
Hgb A1c MFr Bld: 5.4 % (ref 4.8–5.6)
Mean Plasma Glucose: 108 mg/dL

## 2016-02-15 LAB — RUBELLA SCREEN: Rubella: 2.77 index (ref >0.99)

## 2016-02-16 ENCOUNTER — Other Ambulatory Visit (HOSPITAL_COMMUNITY): Payer: Self-pay | Admitting: Advanced Practice Midwife

## 2016-02-16 DIAGNOSIS — O0933 Supervision of pregnancy with insufficient antenatal care, third trimester: Secondary | ICD-10-CM

## 2016-02-16 DIAGNOSIS — R8271 Bacteriuria: Secondary | ICD-10-CM

## 2016-02-16 MED ORDER — PENICILLIN V POTASSIUM 500 MG PO TABS: 500.0000 mg | ORAL_TABLET | Freq: Four times a day (QID) | ORAL | Status: DC

## 2016-02-16 NOTE — Progress Notes (Signed)
+   GBS in urine  Rx Penicillin sent

## 2016-02-21 ENCOUNTER — Inpatient Hospital Stay (HOSPITAL_COMMUNITY): Payer: Medicaid Other | Admitting: Anesthesiology

## 2016-02-21 ENCOUNTER — Inpatient Hospital Stay (HOSPITAL_COMMUNITY)
Admission: AD | Admit: 2016-02-21 | Discharge: 2016-02-22 | DRG: 775 | Disposition: A | Discharge status: Home / Self Care | Payer: Medicaid Other | Source: Ambulatory Visit | Attending: Family Medicine | Admitting: Family Medicine

## 2016-02-21 ENCOUNTER — Encounter (HOSPITAL_COMMUNITY): Payer: Self-pay | Admitting: *Deleted

## 2016-02-21 DIAGNOSIS — Z3A37 37 weeks gestation of pregnancy: Secondary | ICD-10-CM

## 2016-02-21 DIAGNOSIS — O9952 Diseases of the respiratory system complicating childbirth: Secondary | ICD-10-CM | POA: Diagnosis present

## 2016-02-21 DIAGNOSIS — Z8249 Family history of ischemic heart disease and other diseases of the circulatory system: Secondary | ICD-10-CM | POA: Diagnosis not present

## 2016-02-21 DIAGNOSIS — J45909 Unspecified asthma, uncomplicated: Secondary | ICD-10-CM | POA: Diagnosis present

## 2016-02-21 DIAGNOSIS — Z3483 Encounter for supervision of other normal pregnancy, third trimester: Secondary | ICD-10-CM | POA: Diagnosis present

## 2016-02-21 DIAGNOSIS — O99824 Streptococcus B carrier state complicating childbirth: Secondary | ICD-10-CM | POA: Diagnosis present

## 2016-02-21 LAB — TYPE AND SCREEN
ABO/RH(D): A POS
ANTIBODY SCREEN: NEGATIVE

## 2016-02-21 LAB — CBC
HEMATOCRIT: 40.7 % (ref 36.0–46.0)
Hemoglobin: 13.9 g/dL (ref 12.0–15.0)
MCH: 28.6 pg (ref 26.0–34.0)
MCHC: 34.2 g/dL (ref 30.0–36.0)
MCV: 83.7 fL (ref 78.0–100.0)
PLATELETS: 213 10*3/uL (ref 150–400)
RBC: 4.86 MIL/uL (ref 3.87–5.11)
RDW: 13.9 % (ref 11.5–15.5)
WBC: 9.7 10*3/uL (ref 4.0–10.5)

## 2016-02-21 LAB — RPR: RPR Ser Ql: NONREACTIVE

## 2016-02-21 MED ORDER — OXYTOCIN 40 UNITS IN LACTATED RINGERS INFUSION - SIMPLE MED
2.5000 [IU]/h | INTRAVENOUS | Status: DC
  Filled 2016-02-21: qty 1000

## 2016-02-21 MED ORDER — ONDANSETRON HCL 4 MG/2ML IJ SOLN: 4.0000 mg | INTRAMUSCULAR | Status: DC | PRN

## 2016-02-21 MED ORDER — DIBUCAINE 1 % RE OINT: 1.0000 "application " | TOPICAL_OINTMENT | RECTAL | Status: DC | PRN

## 2016-02-21 MED ORDER — TETANUS-DIPHTH-ACELL PERTUSSIS 5-2.5-18.5 LF-MCG/0.5 IM SUSP
0.5000 mL | Freq: Once | INTRAMUSCULAR | Status: DC
Start: 2016-02-22 — End: 2016-02-23

## 2016-02-21 MED ORDER — ZOLPIDEM TARTRATE 5 MG PO TABS: 5.0000 mg | ORAL_TABLET | Freq: Every evening | ORAL | Status: DC | PRN

## 2016-02-21 MED ORDER — OXYCODONE-ACETAMINOPHEN 5-325 MG PO TABS: 2.0000 | ORAL_TABLET | ORAL | Status: DC | PRN

## 2016-02-21 MED ORDER — DIPHENHYDRAMINE HCL 50 MG/ML IJ SOLN: 12.5000 mg | INTRAMUSCULAR | Status: DC | PRN

## 2016-02-21 MED ORDER — ONDANSETRON HCL 4 MG/2ML IJ SOLN: 4.0000 mg | Freq: Four times a day (QID) | INTRAMUSCULAR | Status: DC | PRN

## 2016-02-21 MED ORDER — EPHEDRINE 5 MG/ML INJ
10.0000 mg | INTRAVENOUS | Status: DC | PRN
  Filled 2016-02-21: qty 4

## 2016-02-21 MED ORDER — OXYTOCIN 40 UNITS IN LACTATED RINGERS INFUSION - SIMPLE MED: 1.0000 m[IU]/min | INTRAVENOUS | Status: DC

## 2016-02-21 MED ORDER — ACETAMINOPHEN 325 MG PO TABS: 650.0000 mg | ORAL_TABLET | ORAL | Status: DC | PRN

## 2016-02-21 MED ORDER — DIPHENHYDRAMINE HCL 25 MG PO CAPS: 25.0000 mg | ORAL_CAPSULE | Freq: Four times a day (QID) | ORAL | Status: DC | PRN

## 2016-02-21 MED ORDER — LACTATED RINGERS IV SOLN: 500.0000 mL | Freq: Once | INTRAVENOUS | Status: DC

## 2016-02-21 MED ORDER — IBUPROFEN 600 MG PO TABS
600.0000 mg | ORAL_TABLET | Freq: Four times a day (QID) | ORAL | Status: DC
  Administered 2016-02-21 – 2016-02-22 (×5): 600 mg via ORAL
  Filled 2016-02-21 (×5): qty 1

## 2016-02-21 MED ORDER — LACTATED RINGERS IV SOLN
INTRAVENOUS | Status: DC
  Administered 2016-02-21: 05:00:00 via INTRAVENOUS

## 2016-02-21 MED ORDER — OXYCODONE-ACETAMINOPHEN 5-325 MG PO TABS: 1.0000 | ORAL_TABLET | ORAL | Status: DC | PRN

## 2016-02-21 MED ORDER — PENICILLIN G POTASSIUM 5000000 UNITS IJ SOLR
2.5000 10*6.[IU] | INTRAVENOUS | Status: DC
  Administered 2016-02-21: 2.5 10*6.[IU] via INTRAVENOUS
  Filled 2016-02-21 (×5): qty 2.5

## 2016-02-21 MED ORDER — PHENYLEPHRINE 40 MCG/ML (10ML) SYRINGE FOR IV PUSH (FOR BLOOD PRESSURE SUPPORT)
80.0000 ug | PREFILLED_SYRINGE | INTRAVENOUS | Status: DC | PRN
  Filled 2016-02-21: qty 5

## 2016-02-21 MED ORDER — PHENYLEPHRINE 40 MCG/ML (10ML) SYRINGE FOR IV PUSH (FOR BLOOD PRESSURE SUPPORT)
PREFILLED_SYRINGE | INTRAVENOUS | Status: AC
  Filled 2016-02-21: qty 10

## 2016-02-21 MED ORDER — BENZOCAINE-MENTHOL 20-0.5 % EX AERO
1.0000 "application " | INHALATION_SPRAY | CUTANEOUS | Status: DC | PRN
  Administered 2016-02-21: 1 via TOPICAL
  Filled 2016-02-21: qty 56

## 2016-02-21 MED ORDER — ONDANSETRON HCL 4 MG PO TABS: 4.0000 mg | ORAL_TABLET | ORAL | Status: DC | PRN

## 2016-02-21 MED ORDER — FENTANYL 2.5 MCG/ML BUPIVACAINE 1/10 % EPIDURAL INFUSION (WH - ANES)
INTRAMUSCULAR | Status: AC
  Filled 2016-02-21: qty 125

## 2016-02-21 MED ORDER — FLEET ENEMA 7-19 GM/118ML RE ENEM: 1.0000 | ENEMA | RECTAL | Status: DC | PRN

## 2016-02-21 MED ORDER — TERBUTALINE SULFATE 1 MG/ML IJ SOLN
0.2500 mg | Freq: Once | INTRAMUSCULAR | Status: DC | PRN
  Filled 2016-02-21: qty 1

## 2016-02-21 MED ORDER — LIDOCAINE HCL (PF) 1 % IJ SOLN
INTRAMUSCULAR | Status: DC | PRN
  Administered 2016-02-21 (×2): 6 mL

## 2016-02-21 MED ORDER — FENTANYL 2.5 MCG/ML BUPIVACAINE 1/10 % EPIDURAL INFUSION (WH - ANES)
14.0000 mL/h | INTRAMUSCULAR | Status: DC | PRN
Start: 2016-02-21 — End: 2016-02-21
  Administered 2016-02-21: 14 mL/h via EPIDURAL

## 2016-02-21 MED ORDER — OXYTOCIN BOLUS FROM INFUSION
500.0000 mL | Freq: Once | INTRAVENOUS | Status: AC
  Administered 2016-02-21: 500 mL via INTRAVENOUS

## 2016-02-21 MED ORDER — WITCH HAZEL-GLYCERIN EX PADS: 1.0000 "application " | MEDICATED_PAD | CUTANEOUS | Status: DC | PRN

## 2016-02-21 MED ORDER — PENICILLIN G POTASSIUM 5000000 UNITS IJ SOLR
5.0000 10*6.[IU] | Freq: Once | INTRAMUSCULAR | Status: AC
  Administered 2016-02-21: 5 10*6.[IU] via INTRAVENOUS
  Filled 2016-02-21: qty 5

## 2016-02-21 MED ORDER — COCONUT OIL OIL
1.0000 | TOPICAL_OIL | Status: DC | PRN
Start: 2016-02-21 — End: 2016-02-23

## 2016-02-21 MED ORDER — LACTATED RINGERS IV SOLN
500.0000 mL | INTRAVENOUS | Status: DC | PRN
  Administered 2016-02-21: 300 mL via INTRAVENOUS

## 2016-02-21 MED ORDER — SIMETHICONE 80 MG PO CHEW: 80.0000 mg | CHEWABLE_TABLET | ORAL | Status: DC | PRN

## 2016-02-21 MED ORDER — SENNOSIDES-DOCUSATE SODIUM 8.6-50 MG PO TABS
2.0000 | ORAL_TABLET | ORAL | Status: DC
  Administered 2016-02-21: 2 via ORAL
  Filled 2016-02-21: qty 2

## 2016-02-21 MED ORDER — SOD CITRATE-CITRIC ACID 500-334 MG/5ML PO SOLN: 30.0000 mL | ORAL | Status: DC | PRN

## 2016-02-21 MED ORDER — OXYTOCIN 40 UNITS IN LACTATED RINGERS INFUSION - SIMPLE MED
1.0000 m[IU]/min | INTRAVENOUS | Status: DC
  Administered 2016-02-21: 2 m[IU]/min via INTRAVENOUS

## 2016-02-21 MED ORDER — LIDOCAINE HCL (PF) 1 % IJ SOLN
30.0000 mL | INTRAMUSCULAR | Status: DC | PRN
  Filled 2016-02-21: qty 30

## 2016-02-21 MED ORDER — PRENATAL MULTIVITAMIN CH
1.0000 | ORAL_TABLET | Freq: Every day | ORAL | Status: DC
  Administered 2016-02-22: 1 via ORAL
  Filled 2016-02-21: qty 1

## 2016-02-21 NOTE — Lactation Note (Signed)
This note was copied from a baby's chart. Lactation Consultation Note LC reported to [place], [place] who plans to set mom up with DEBP due to feedings and supplementation not continuing into the evening hours.    Patient Name: Regina Shea [id number] Date: 02/21/2016     Maternal Data    Feeding    LATCH Score/Interventions                      Lactation Tools Discussed/Used     Consult Status      Shop[place]w, Justine Null 02/21/2016, 11:09 PM

## 2016-02-21 NOTE — Progress Notes (Signed)
Pt. Feels nauseous at this time, pale in color. Interpretor called at this time. Fluid bolus administered at this time. Will continue to monitor

## 2016-02-21 NOTE — Anesthesia Procedure Notes (Signed)
Epidural Patient location during procedure: OB  Staffing Anesthesiologist: Franne Grip  Preanesthetic Checklist Completed: patient identified, site marked, surgical consent, pre-op evaluation, timeout performed, IV checked, risks and benefits discussed and monitors and equipment checked  Epidural Patient position: sitting Patient monitoring: heart rate and blood pressure Approach: midline Location: L3-L4 Injection technique: LOR saline  Needle:  Needle type: Tuohy  Needle gauge: 17 G Needle length: 9 cm Needle insertion depth: 5 cm Catheter type: closed end flexible Catheter size: 19 Gauge Catheter at skin depth: 13 cm Test dose: negative and Other  Assessment Events: blood not aspirated, injection not painful, no injection resistance, negative IV test and no paresthesia  Additional Notes Reason for block:procedure for pain

## 2016-02-21 NOTE — Progress Notes (Signed)
At [place] pt was gotten up to the bathroom with the stedy.  Peri care completed and pt tried to pee but was unable to.  RN bladder scanned pt and it showed 700 ml of urine in bladder.  In and out cath done and 1000 ml out.  Procedure was explained via interpreter and pt encouraged to continue to drink water and let RN know when she needed to pee.

## 2016-02-21 NOTE — Progress Notes (Signed)
Interpreter present for admission assessment

## 2016-02-21 NOTE — Progress Notes (Signed)
Interpretor in room at this time. Patient is crying and states she feels light headed and nauseous. Baby HR stable and mother BP stable. Will continue to monitor.

## 2016-02-21 NOTE — Progress Notes (Signed)
I check pt needs , I ordered meals, and I assisted RN Jana from Lactation, by Juliann Mule Spanish Interpreter.

## 2016-02-21 NOTE — H&P (Signed)
Regina Shea is a 35 y.o. female presenting for labor. She reports ctx q3 min since 0100. She received prenatal care in [place]. Her pregnancy has been uncomplicated per her report although records are not on file. She denies HTN and DM and reports normal ultrasounds. She is GBS positive. She's had SVD x3 and pelvis is proven to 6.5 lbs.   OB History    Gravida Para Term Preterm AB Living   4 3 3  0 0 3   SAB TAB Ectopic Multiple Live Births   0 0 0 0       Past Medical History:  Diagnosis Date  . Asthma   Migraine Headache Past Surgical History:  Procedure Laterality Date  . NO PAST SURGERIES     Family History: family history includes Hypertension in her father and mother. Social History:  reports that she has never smoked. She has never used smokeless tobacco. She reports that she does not drink alcohol or use drugs.     Maternal Diabetes: No Genetic Screening: Declined Maternal Ultrasounds/Referrals: Normal Fetal Ultrasounds or other Referrals:  None Maternal Substance Abuse:  No Significant Maternal Medications:  None Significant Maternal Lab Results:  Lab values include: Group B Strep positive Other Comments:  None  Review of Systems  Constitutional: Negative.   Respiratory: Negative.   Cardiovascular: Negative.   Neurological: Negative.    History Dilation: 7 Effacement (%): 70 Station: -2 Exam by:: Shonna Deiter Blood pressure 109/77, pulse 88, temperature 98.1 F (36.7 C), temperature source Oral, resp. rate 16, SpO2 100 %, unknown if currently breastfeeding. Exam Physical Exam  Constitutional: She is oriented to person, place, and time. She appears well-developed and well-nourished.  HENT:  Head: Normocephalic and atraumatic.  Neck: Normal range of motion. Neck supple.  Cardiovascular: Normal rate.   Respiratory: Effort normal.  GI: Soft. She exhibits no distension. There is no tenderness.  Gravid EFW by leopolds 7#  Musculoskeletal: Normal range  of motion.  Neurological: She is alert and oriented to person, place, and time.  Skin: Skin is warm and dry.  Psychiatric: She has a normal mood and affect.   EFM: 135 bpm, mod variability, no accels, occ. variable decels Toco: q5  Prenatal labs: ABO, Rh: --/--/A POS (07/25 0410) Antibody: NEG (07/25 0410) Rubella: 2.77 (07/17 2100) RPR: Non Reactive (07/17 2100)  HBsAg: Negative (07/17 2100)  HIV: Non Reactive (07/17 2100)  GBS: Positive (07/17 0000)   Assessment/Plan: [redacted] weeks gestation Active labor Cat II FHT GBS carrier  Admit EFM per policy Analgesia prn GBS prophylaxis Anticipate SVD   Julianne Handler, CNM 02/21/2016, 5:41 AM

## 2016-02-21 NOTE — Anesthesia Preprocedure Evaluation (Signed)
Anesthesia Evaluation  Patient identified by MRN, date of birth, ID band Patient awake    Reviewed: Allergy & Precautions, NPO status , Patient's Chart, lab work & pertinent test results  Airway Mallampati: II  TM Distance: >3 FB Neck ROM: Full    Dental no notable dental hx.    Pulmonary neg pulmonary ROS,    Pulmonary exam normal breath sounds clear to auscultation       Cardiovascular negative cardio ROS Normal cardiovascular exam Rhythm:Regular Rate:Normal     Neuro/Psych negative neurological ROS  negative psych ROS   GI/Hepatic negative GI ROS, Neg liver ROS,   Endo/Other  negative endocrine ROS  Renal/GU negative Renal ROS  negative genitourinary   Musculoskeletal negative musculoskeletal ROS (+)   Abdominal   Peds negative pediatric ROS (+)  Hematology negative hematology ROS (+)   Anesthesia Other Findings   Reproductive/Obstetrics negative OB ROS                             Anesthesia Physical Anesthesia Plan  ASA: II  Anesthesia Plan: Epidural   Post-op Pain Management:    Induction:   Airway Management Planned: Natural Airway  Additional Equipment:   Intra-op Plan:   Post-operative Plan:   Informed Consent: I have reviewed the patients History and Physical, chart, labs and discussed the procedure including the risks, benefits and alternatives for the proposed anesthesia with the patient or authorized representative who has indicated his/her understanding and acceptance.   Dental advisory given  Plan Discussed with: CRNA  Anesthesia Plan Comments: (Informed consent obtained prior to proceeding including risk of failure, 1% risk of PDPH, risk of minor discomfort and bruising.  Discussed rare but serious complications including epidural abscess, permanent nerve injury, epidural hematoma.  Discussed alternatives to epidural analgesia and patient desires to proceed.   Timeout performed pre-procedure verifying patient name, procedure, and platelet count.  Patient tolerated procedure well.)        Anesthesia Quick Evaluation

## 2016-02-21 NOTE — Anesthesia Pain Management Evaluation Note (Signed)
  CRNA Pain Management Visit Note  Patient: Regina Shea, 35 y.o., female  "Hello I am a member of the anesthesia team at [facility]. We have an anesthesia team available at all times to provide care throughout the hospital, including epidural management and anesthesia for C-section. I don't know your plan for the delivery whether it a natural birth, water birth, IV sedation, nitrous supplementation, doula or epidural, but we want to meet your pain goals."   1.Was your pain managed to your expectations on prior hospitalizations?   Yes   2.What is your expectation for pain management during this hospitalization?     Epidural  3.How can we help you reach that goal? epidural  Record the patient's initial score and the patient's pain goal.   Pain: 0  Pain Goal: 3 [facility] wants you to be able to say your pain was always managed very well.  Everette Rank 02/21/2016

## 2016-02-21 NOTE — Progress Notes (Signed)
I was present during delivery. [place]  Interpreter.

## 2016-02-21 NOTE — Lactation Note (Signed)
This note was copied from a baby's chart. Lactation Consultation Note Initial visit at 5 hours of age with Spanish interpreter, [place].  Baby is [redacted]w[redacted]d and 5#12oz.  Baby has been spoon fed mom has easily expressed colostrum.  Mom has semiflat nipples that evert well with hand pump.  Mom reports difficulty latching 3 older children due to "her nipples."  Mom hand pumped for 1 month and gave breastmilk in bottles for 1 month with each older child.  Mom has NS in room given by Rn in attempt to latch, but baby didn't like it and mom doesn't plan to use it again.  Mom is aware to call for assist if she feels she needs to use it.  Baby had a previous good blood sugar level with last feeding and mom has a good supply of colostrum at this time.  LC discussed LPI policy and encouraged mom to request DEBP if baby is not latching well or she isn't able to give baby enough supplement.  LC feels it is more important for mom to work on hand expression to supplement and latching who appears to be doing well at this time. LC discussed plans to use DEBP if needed and mom is unsure how she will get a pump she is not currently getting WIC services at this time.  LC encouraged mom to call [facility] and schedule an appointment.  Mom is familiar with hand pump and has also used a piston style hand pump in the past. LC reviewed cleaning and storage guidelines with mom.  LC assisted with hand expression of about 38ml collected in colostrum container to use for next feeding.  Mom is able to demonstrate proper technique with hand expression.   Plan feed with cues and wake baby as needed every 2-3 hours.  Use hand pump to evert nipple and hand express to start flow.  Mom to latch baby and supplement with colostrum on spoon feeding as needed. [facility] LC resources given and discussed.  Early newborn behavior discussed.  Mom to call for assist as needed.     Patient Name: Boy Annalycia Coryell [id number] Date: 02/21/2016 Reason for consult: Initial  assessment   Maternal Data Has patient been taught Hand Expression?: Yes Does the patient have breastfeeding experience prior to this delivery?: Yes  Feeding Feeding Type: Breast Fed Length of feed: 20 min  LATCH Score/Interventions                      Lactation Tools Discussed/Used Tools:  (has NS from Rn not using) WIC Program: No (enc. to call) Pump Review: Setup, frequency, and cleaning;Milk Storage Initiated by:: JS Date initiated:: 02/21/16   Consult Status Consult Status: Follow-up Date: 02/22/16 Follow-up type: In-patient    Nashya Garlington, Justine Null 02/21/2016, 5:14 PM

## 2016-02-21 NOTE — MAU Note (Signed)
Pt presents with complaint of contractions since 2am

## 2016-02-21 NOTE — Progress Notes (Signed)
I assisted [place],RN with admission.[place] Interpreter.

## 2016-02-21 NOTE — Progress Notes (Signed)
I assisted Carmell Austria and Rodena Piety, RN's with explanation of care plan. [place] Interpreter.

## 2016-02-22 ENCOUNTER — Ambulatory Visit: Payer: Self-pay

## 2016-02-22 MED ORDER — IBUPROFEN 600 MG PO TABS: 600.0000 mg | ORAL_TABLET | Freq: Four times a day (QID) | ORAL | 0 refills | Status: DC

## 2016-02-22 NOTE — Progress Notes (Signed)
I assisted Regina Shea CNM with explanation of care plan. [place] Interpreter.

## 2016-02-22 NOTE — Discharge Instructions (Signed)
Cuidados en el postparto luego de un parto vaginal  °(Postpartum Care After Vaginal Delivery) °Después del parto (período de postparto), la estadía normal en el hospital es de 24-72 horas. Si hubo problemas con el trabajo de parto o el parto, o si tiene otros problemas médicos, es posible que deba permanecer en el hospital por más tiempo.  °Mientras esté en el hospital, recibirá ayuda e instrucciones sobre cómo cuidar de usted misma y de su bebé recién nacido durante el postparto.  °Mientras esté en el hospital:  °· Asegúrese de decirle a las enfermeras si siente dolor o malestar, así como donde siente el dolor y qué empeora el dolor. °· Si usted tuvo una incisión cerca de la vagina (episiotomía) o si ha tenido algún desgarro durante el parto, las enfermeras le pondrán hielo sobre la episiotomía o el desgarro. Las bolsas de hielo pueden ayudar a reducir el dolor y la hinchazón. °· Si está amamantando, puede sentir contracciones dolorosas en el útero durante algunas semanas. Esto es normal. Las contracciones ayudan a que el útero vuelva a su tamaño normal. °· Es normal tener algo de sangrado después del parto. °¨ Durante los primeros 1-3 días después del parto, el flujo es de color rojo y la cantidad puede ser similar a un período. °¨ Es frecuente que el flujo se inicie y se detenga. °¨ En los primeros días, puede eliminar algunos coágulos pequeños. Informe a las enfermeras si elimina coágulos grandes o aumenta el flujo. °¨ No  elimine los coágulos de sangre por el inodoro antes de que la enfermera los vea. °¨ Durante los próximos 3 a 10 días después del parto, el flujo debe ser más acuoso y rosado o marrón. °¨ De diez a catorce días después del parto, el flujo debe ser una pequeña cantidad de secreción de color blanco amarillento. °¨ La cantidad de flujo disminuirá en las primeras semanas después del parto. El flujo puede detenerse en 6-8 semanas. La mayoría de las mujeres no tienen más flujo a las 12 semanas  después del parto. °· Usted debe cambiar sus apósitos con frecuencia. °· Lávese bien las manos con agua y jabón durante al menos 20 segundos después de cambiar el apósito, usar el baño o antes de sostener o alimentar a su recién nacido. °· Usted podrá sentir como que tiene que vaciar la vejiga durante las primeras 6-8 horas después del parto. °· En caso de que sienta debilidad, mareo o desmayo, llame a la enfermera antes de levantarse de la cama por primera vez y antes de tomar una ducha por primera vez. °· Dentro de los primeros días después del parto, sus mamas pueden comenzar a estar sensibles y llenas. Esto se llama congestión. La sensibilidad en los senos por lo general desaparece dentro de las 48-72 horas después de que ocurre la congestión. También puede notar que la leche se escapa de sus senos. Si no está amamantando no estimule sus pechos. La estimulación de las mamas hace que sus senos produzcan más leche. °· Pasar tanto tiempo como le sea posible con el bebé recién nacido es muy importante. Durante ese tiempo, usted y su bebé deben sentirse cerca y conocerse uno al otro. Tener al bebé en su habitación (alojamiento conjunto) ayudará a fortalecer el vínculo con el bebé recién nacido. Esto le dará tiempo para conocerlo y atenderlo de manera cómoda. °· Las hormonas se modifican después del parto. A veces, los cambios hormonales pueden causar tristeza o ganas de llorar por un tiempo. Estos sentimientos   no deben durar ms de [employer]. Si duran ms que eso, debe hablar con su mdico.  Si lo desea, hable con su mdico acerca de los mtodos de planificacin familiar o mtodos anticonceptivos.  Hable con su mdico acerca de las vacunas. El mdico puede indicarle que se aplique las siguientes vacunas antes de salir del hospital:  [place] contra el ttanos, la difteria y la tos ferina (Tdap) o el ttanos y la difteria (Td). Es muy importante que usted y su familia (incluyendo a los abuelos) u otras  personas que cuidan al recin nacido estn al da con las vacunas Tdap o Td. Las vacunas Tdap o Td pueden ayudar a proteger al recin nacido de enfermedades.  Inmunizacin contra la rubola.  Inmunizacin contra la varicela.  Inmunizacin contra la gripe. Usted debe recibir esta vacunacin anual si no la ha recibido Solicitor.   Esta informacin no tiene Marine scientist el consejo del mdico. Asegrese de hacerle al mdico cualquier pregunta que tenga.   Document Released: 05/13/2007 Document Revised: 04/09/2012 Elsevier Interactive Patient Education [employer].

## 2016-02-22 NOTE — Anesthesia Postprocedure Evaluation (Signed)
Anesthesia Post Note  Patient: Regina Shea  Procedure(s) Performed: * No procedures listed *  Patient location during evaluation: Mother Baby Anesthesia Type: Epidural Level of consciousness: awake, awake and alert, oriented and patient cooperative Pain management: pain level controlled Vital Signs Assessment: post-procedure vital signs reviewed and stable Respiratory status: spontaneous breathing, nonlabored ventilation and respiratory function stable Cardiovascular status: stable Postop Assessment: patient able to bend at knees, no backache, no signs of nausea or vomiting and no headache Anesthetic complications: no     Last Vitals:  Vitals:   02/21/16 1730 02/22/16 0600  BP: 112/62 (!) 110/59  Pulse: 85 83  Resp: 20 18  Temp: 36.9 C 36.9 C    Last Pain:  Vitals:   02/22/16 0636  TempSrc:   PainSc: 0-No pain   Pain Goal: Patients Stated Pain Goal: 3 (02/21/16 1730)               Braidan Ricciardi L

## 2016-02-22 NOTE — Progress Notes (Signed)
Assisted mother with latching baby to breast. Infant is rooting and making attempts to latch but does maintain the latch. Attempted to latch baby using #24 nipple shield without success. Mother has been hand expressing and feeding baby by spoon. She is bottle feeding formula and baby is taking small amounts. Assisted mother with formula feeding. Baby formed a seal around the nipple and suckle intermittently with encouragement. Breast pump has been set up for mother to use. Report given to  Lactation Consultant and request to assist mother with feeding.

## 2016-02-22 NOTE — Lactation Note (Signed)
This note was copied from a baby's chart. Lactation Consultation Note  Patient Name: Regina Shea [id number] Date: 02/22/2016 Reason for consult: Follow-up assessment Baby at 34 hr of life and mom has switched to formula. Given information about changing to Similac Advanced instead of the Alumentum and up coming breast changes. She is aware of lactation services and support group. She will call as needed.   Maternal Data    Feeding    LATCH Score/Interventions                      Lactation Tools Discussed/Used     Consult Status Consult Status: Complete    Denzil Hughes 02/22/2016, 10:30 PM

## 2016-02-22 NOTE — Discharge Summary (Signed)
Obstetric Discharge Summary Reason for Admission: onset of labor and 37 weeks Prenatal Procedures: ultrasound Intrapartum Procedures: spontaneous vaginal delivery and GBS prophylaxis Postpartum Procedures: none Complications-Operative and Postpartum: 1st degree perineal laceration Hemoglobin  Date Value Ref Range Status  02/21/2016 13.9 12.0 - 15.0 g/dL Final   HCT  Date Value Ref Range Status  02/21/2016 40.7 36.0 - 46.0 % Final    Physical Exam:  General: alert, cooperative and no distress Lochia: appropriate Uterine Fundus: firm Incision: healing well, no significant drainage, no dehiscence, no significant erythema DVT Evaluation: No evidence of DVT seen on physical exam. Negative Homan's sign. No cords or calf tenderness. No significant calf/ankle edema.  Discharge Diagnoses: Term Pregnancy-delivered  Discharge Information: Date: 02/22/2016 Activity: pelvic rest Diet: routine Medications: PNV and Ibuprofen Condition: stable Instructions: refer to practice specific booklet Discharge to: home [place] for [place]. Schedule an appointment as soon as possible for a visit in 6 week(s).   Specialty:  Obstetrics and Gynecology Contact information: [place] [phone]          Newborn Data: Live born female  Birth Weight: 5 lb 12 oz (2608 g) APGAR: 9, 9  Home with mother.  Regina Shea 02/22/2016, 9:37 AM

## 2016-02-27 ENCOUNTER — Telehealth: Payer: Self-pay | Admitting: General Practice

## 2016-02-27 ENCOUNTER — Encounter: Payer: Medicaid Other | Admitting: Obstetrics & Gynecology

## 2016-02-27 DIAGNOSIS — O2343 Unspecified infection of urinary tract in pregnancy, third trimester: Principal | ICD-10-CM

## 2016-02-27 DIAGNOSIS — B951 Streptococcus, group B, as the cause of diseases classified elsewhere: Secondary | ICD-10-CM

## 2016-02-27 MED ORDER — CEPHALEXIN 500 MG PO CAPS: 500.0000 mg | ORAL_CAPSULE | Freq: Four times a day (QID) | ORAL | 0 refills | Status: DC

## 2016-02-27 NOTE — Telephone Encounter (Signed)
Called patient with [facility] for interpreter. Patient's husband answered stating he was at work and wasn't with Sharvi but would take a message. Told him to let Leighana know she has a UTI and we have sent an antibiotic to her pharmacy to go by and pick up. He verbalized understanding & states he will tell her.

## 2016-09-19 ENCOUNTER — Ambulatory Visit: Payer: Medicaid Other

## 2016-09-19 ENCOUNTER — Ambulatory Visit: Payer: Medicaid Other | Admitting: Family Medicine

## 2016-09-20 ENCOUNTER — Ambulatory Visit: Payer: Medicaid Other | Admitting: Family Medicine

## 2017-01-14 ENCOUNTER — Ambulatory Visit: Payer: Self-pay | Attending: Internal Medicine | Admitting: Internal Medicine

## 2017-01-14 ENCOUNTER — Encounter: Payer: Self-pay | Admitting: Internal Medicine

## 2017-01-14 VITALS — BP 115/77 | HR 77 | Temp 98.3°F | Resp 16 | Wt 167.4 lb

## 2017-01-14 DIAGNOSIS — Z131 Encounter for screening for diabetes mellitus: Secondary | ICD-10-CM

## 2017-01-14 DIAGNOSIS — Z3009 Encounter for other general counseling and advice on contraception: Secondary | ICD-10-CM

## 2017-01-14 DIAGNOSIS — G43109 Migraine with aura, not intractable, without status migrainosus: Secondary | ICD-10-CM

## 2017-01-14 DIAGNOSIS — Z369 Encounter for antenatal screening, unspecified: Secondary | ICD-10-CM | POA: Insufficient documentation

## 2017-01-14 LAB — POCT URINE PREGNANCY: PREG TEST UR: NEGATIVE

## 2017-01-14 LAB — POCT GLYCOSYLATED HEMOGLOBIN (HGB A1C): HEMOGLOBIN A1C: 5.1

## 2017-01-14 MED ORDER — SUMATRIPTAN SUCCINATE 50 MG PO TABS: ORAL_TABLET | ORAL | 3 refills | Status: DC

## 2017-01-14 MED FILL — SUMATRIPTAN SUCC 50 MG TAB: 50 | 30 days supply | Qty: 9 | Fill #0

## 2017-01-14 NOTE — Progress Notes (Signed)
Patient ID: Regina Shea, female    DOB: 1980/09/12  MRN: [id number]  CC: New Patient (Initial Visit) and Contraception   Subjective: Regina Shea is a 36 y.o. female who presents for new pt visit Her concerns today include:  1. Wanting to get on some form of BC. -she was on [facility] shot in her country 2 yrs ago.  She is G4P4 -last PAP was 1.5 yrs ago in [place].  On menses now.  Hx of migraines.   Usually stress induced .last was 15 days ago and lasted 5 days. Takes an antimigraine medication from [place] and she was out of it at the time   If she has med, she gets relief within 30 minutes. -has 4 children and of whom has ADD -Migraines usually in temple area associated with N/V, photophobia.  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  Admitted to hosp in [place].  "They never really knew what happened and they never told me it was related to the migraine either."   Patient Active Problem List   Diagnosis Date Noted  . Diabetes mellitus screening 01/14/2017  . Normal labor 02/21/2016  . Group B streptococcal bacteriuria 02/16/2016  . No prenatal care in current pregnancy in third trimester 02/16/2016  . Positive GBS test 02/14/2016     Current Outpatient Prescriptions on File Prior to Visit  Medication Sig Dispense Refill  . cephALEXin (KEFLEX) 500 MG capsule Take 1 capsule (500 mg total) by mouth 4 (four) times daily. 28 capsule 0  . ibuprofen (ADVIL,MOTRIN) 600 MG tablet Take 1 tablet (600 mg total) by mouth every 6 (six) hours. 30 tablet 0  . Prenatal Vit-Fe Fumarate-FA (PRENATAL MULTIVITAMIN) TABS tablet Take 1 tablet by mouth daily at 12 noon.     No current facility-administered medications on file prior to visit.     No Known Allergies  Social History   Social History  . Marital status: Married    Spouse name: N/A  . Number of children: N/A  . Years of education: N/A    Occupational History  . Not on file.   Social History Main Topics  . Smoking status: Never Smoker  . Smokeless tobacco: Never Used  . Alcohol use No  . Drug use: No  . Sexual activity: Yes   Other Topics Concern  . Not on file   Social History Narrative  . No narrative on file    Family History  Problem Relation Age of Onset  . Hypertension Mother   . Hypertension Father     Past Surgical History:  Procedure Laterality Date  . NO PAST SURGERIES      ROS: Review of Systems As above PHYSICAL EXAM: BP 115/77   Pulse 77   Temp 98.3 F (36.8 C) (Oral)   Resp 16   Wt 167 lb 6.4 oz (75.9 kg)   SpO2 97%   BMI 30.61 kg/m   Physical Exam General appearance - alert, well appearing, young female and in no distress Mental status - alert, oriented to person, place, and time Neck - supple, no significant adenopathy Chest - clear to auscultation, no wheezes, rales or rhonchi, symmetric air entry Heart - normal rate, regular rhythm, normal S1, S2, no murmurs, rubs, clicks or gallops Extremities - peripheral pulses normal, no pedal edema, no clubbing or cyanosis   Results for orders placed or performed in visit on 01/14/17  POCT glycosylated hemoglobin (Hb A1C)  Result Value Ref Range   Hemoglobin A1C 5.1   POCT urine pregnancy  Result Value Ref Range   Preg Test, Ur Negative Negative    ASSESSMENT AND PLAN: 1. Family planning counseling -We discussed various birth control methods. Given her history of migraine with aura, I would shy away from using combined hormone birth control methods.  I suggested IUD versus Nexplanon.  Patient agreeable to the latter.  We will have an appointment scheduled with Dr. Adrian Blackwater - POCT urine pregnancy  2. Migraine with aura and without status migrainosus, not intractable -Patient reports headaches are not very frequent so no prophylactic therapy prescribed. We will use Imitrex when necessary - SUMAtriptan (IMITREX) 50 MG tablet;  Take 1 tab PO at start of Headache.  May repeat in 2 hrs x one if HA persists.  Limit to 2 tabs/24  Dispense: 10 tablet; Refill: 3  3. Diabetes mellitus screening - POCT glycosylated hemoglobin (Hb A1C)  Patient was given the opportunity to ask questions.  Patient verbalized understanding of the plan and was able to repeat key elements of the plan.   Orders Placed This Encounter  Procedures  . POCT glycosylated hemoglobin (Hb A1C)  . POCT urine pregnancy     Requested Prescriptions   Signed Prescriptions Disp Refills  . SUMAtriptan (IMITREX) 50 MG tablet 10 tablet 3    Sig: Take 1 tab PO at start of Headache.  May repeat in 2 hrs x one if HA persists.  Limit to 2 tabs/24    Return in about 3 weeks (around 02/04/2017) for Nexplanon placement with [place] 30 min.  Karle Plumber, MD, FACP

## 2017-02-14 ENCOUNTER — Encounter: Payer: Self-pay | Admitting: Family Medicine

## 2017-02-14 ENCOUNTER — Ambulatory Visit: Payer: Self-pay | Attending: Internal Medicine | Admitting: Family Medicine

## 2017-02-14 VITALS — BP 112/72 | HR 82 | Temp 98.2°F | Ht 61.0 in | Wt 168.4 lb

## 2017-02-14 DIAGNOSIS — Z30017 Encounter for initial prescription of implantable subdermal contraceptive: Secondary | ICD-10-CM

## 2017-02-14 DIAGNOSIS — Z975 Presence of (intrauterine) contraceptive device: Secondary | ICD-10-CM

## 2017-02-14 LAB — POCT URINE PREGNANCY: Preg Test, Ur: NEGATIVE

## 2017-02-14 MED ORDER — ETONOGESTREL 68 MG ~~LOC~~ IMPL
68.0000 mg | DRUG_IMPLANT | Freq: Once | SUBCUTANEOUS | Status: AC
  Administered 2017-02-14: 68 mg via SUBCUTANEOUS

## 2017-02-14 NOTE — Patient Instructions (Addendum)
Regina Shea was seen today for contraception.  Diagnoses and all orders for this visit:  Nexplanon insertion -     POCT urine pregnancy -     etonogestrel (NEXPLANON) implant 68 mg; 68 mg by Subdermal route once.  Nexplanon in place -     etonogestrel (NEXPLANON) implant 68 mg; 68 mg by Subdermal route once.    Nexplanon Instructions After Insertion   Keep bandage clean and dry for 24 hours   May use ice/Tylenol/Ibuprofen for soreness or pain   If you develop fever, drainage or increased warmth from incision site-contact office immediately  Dr. Adrian Blackwater  F/u in 3-4 weeks with Dr. Wynetta Emery for nexplanon f/u and pap smear   Dr. Adrian Blackwater

## 2017-02-14 NOTE — Progress Notes (Signed)
   Subjective:  Patient ID: Regina Shea, female    DOB: 1981/06/06  Age: 36 y.o. MRN: [id number]  CC: Contraception   HPI Regina Shea presents for   1. nexplanon insertion: she desires long term contraception.   Social History  Substance Use Topics  . Smoking status: Never Smoker  . Smokeless tobacco: Never Used  . Alcohol use No    Outpatient Medications Prior to Visit  Medication Sig Dispense Refill  . SUMAtriptan (IMITREX) 50 MG tablet Take 1 tab PO at start of Headache.  May repeat in 2 hrs x one if HA persists.  Limit to 2 tabs/24 10 tablet 3  . cephALEXin (KEFLEX) 500 MG capsule Take 1 capsule (500 mg total) by mouth 4 (four) times daily. (Patient not taking: Reported on 02/14/2017) 28 capsule 0  . ibuprofen (ADVIL,MOTRIN) 600 MG tablet Take 1 tablet (600 mg total) by mouth every 6 (six) hours. (Patient not taking: Reported on 02/14/2017) 30 tablet 0  . Prenatal Vit-Fe Fumarate-FA (PRENATAL MULTIVITAMIN) TABS tablet Take 1 tablet by mouth daily at 12 noon.     No facility-administered medications prior to visit.     ROS Review of Systems  Constitutional: Negative for chills, fatigue and fever.  Skin: Negative for color change, rash and wound.    Objective:  BP 112/72   Pulse 82   Temp 98.2 F (36.8 C) (Oral)   Ht 5\' 1"  (1.549 m)   Wt 168 lb 6.4 oz (76.4 kg)   SpO2 98%   BMI 31.82 kg/m   BP/Weight 02/14/2017 01/14/2017 03/09/314  Systolic BP 945 859 292  Diastolic BP 72 77 71  Wt. (Lbs) 168.4 167.4 177  BMI 31.82 30.61 -     Physical Exam  PROCEDURE NOTE: Nexplanon insertion Patient given informed consent, signed copy in the chart.  Appropriate time out taken  Pregnancy test was negative. The patient's  left  arm was prepped and draped in the usual sterile fashion.  The ruler used to measure and mark the insertion area 8 cm from medial epicondyle of the elbow. Local anaesthesia obtained using 1.5 cc of 1% lidocaine with epinephrine.  Nexplanon was inserted per manufacturer's directions. Less than 3 cc blood loss. The insertion site covered with a pressure bandage to minimize bruising. There were no complications and the patient tolerated the procedure well.  Device information was given in handout form. Patient is informed the removal date will be in three years.   Lot #[id number] Exp 05/2019  Assessment & Plan:  Regina Shea was seen today for contraception.  Diagnoses and all orders for this visit:  Nexplanon insertion -     POCT urine pregnancy -     etonogestrel (NEXPLANON) implant 68 mg; 68 mg by Subdermal route once.  Nexplanon in place -     etonogestrel (NEXPLANON) implant 68 mg; 68 mg by Subdermal route once.   There are no diagnoses linked to this encounter.  No orders of the defined types were placed in this encounter.   Follow-up: Return in about 4 weeks (around 03/14/2017) for pap smear and nexplanon site check.   Boykin Nearing MD

## 2017-02-14 NOTE — Assessment & Plan Note (Signed)
nexplanon insertion done today F/u in 3-4 weeks for site check and pap smear with PCP

## 2017-02-22 ENCOUNTER — Ambulatory Visit: Payer: Self-pay | Attending: Internal Medicine

## 2017-03-12 ENCOUNTER — Ambulatory Visit: Payer: Self-pay | Attending: Internal Medicine | Admitting: Internal Medicine

## 2017-03-12 ENCOUNTER — Encounter: Payer: Self-pay | Admitting: Internal Medicine

## 2017-03-12 VITALS — BP 126/76 | HR 77 | Temp 98.6°F | Resp 16 | Wt 168.0 lb

## 2017-03-12 DIAGNOSIS — M79602 Pain in left arm: Secondary | ICD-10-CM | POA: Insufficient documentation

## 2017-03-12 DIAGNOSIS — N946 Dysmenorrhea, unspecified: Secondary | ICD-10-CM | POA: Insufficient documentation

## 2017-03-12 NOTE — Progress Notes (Signed)
    Patient ID: Regina Shea, female    DOB: 04/29/1981  MRN: [id number]  CC: Arm Pain   Subjective: Regina Shea is a 36 y.o. female who presents for f/u post Nexplanon placement Her concerns today include: Had a little pain in LT  Arm after Nexplanon placement last mth but has since resolved Had menses come on yesterday for first time since Nexplanon.  Having a lot of cramps than is usual compared to before but bleeding is lighter.  Patient Active Problem List   Diagnosis Date Noted  . Nexplanon in place 02/14/2017     Current Outpatient Prescriptions on File Prior to Visit  Medication Sig Dispense Refill  . Prenatal Vit-Fe Fumarate-FA (PRENATAL MULTIVITAMIN) TABS tablet Take 1 tablet by mouth daily at 12 noon.    . SUMAtriptan (IMITREX) 50 MG tablet Take 1 tab PO at start of Headache.  May repeat in 2 hrs x one if HA persists.  Limit to 2 tabs/24 (Patient not taking: Reported on 03/12/2017) 10 tablet 3   No current facility-administered medications on file prior to visit.     No Known Allergies  Social History   Social History  . Marital status: Married    Spouse name: N/A  . Number of children: N/A  . Years of education: N/A   Occupational History  . Not on file.   Social History Main Topics  . Smoking status: Never Smoker  . Smokeless tobacco: Never Used  . Alcohol use No  . Drug use: No  . Sexual activity: Yes   Other Topics Concern  . Not on file   Social History Narrative  . No narrative on file    Family History  Problem Relation Age of Onset  . Hypertension Mother   . Hypertension Father     Past Surgical History:  Procedure Laterality Date  . NO PAST SURGERIES      ROS: Review of Systems Neg except as above PHYSICAL EXAM: BP 126/76   Pulse 77   Temp 98.6 F (37 C) (Oral)   Resp 16   Wt 168 lb (76.2 kg)   SpO2 98%   BMI 31.74 kg/m   Wt Readings from Last 3 Encounters:  03/12/17 168 lb (76.2 kg)  02/14/17 168 lb  6.4 oz (76.4 kg)  01/14/17 167 lb 6.4 oz (75.9 kg)    Physical Exam General appearance - alert, well appearing, and in no distress Mental status - alert, oriented to person, place, and time, normal mood, behavior, speech, dress, motor activity, and thought processes Skin: Nexplanon felt under the skin in LT upper inner arm   ASSESSMENT AND PLAN: 1. Dysmenorrhea -Patient advised that menses will become lighter and sometimes may even stop while on Nexplanon. -Advised use of ibuprofen as needed for the cramps and let's see whether the cramps are less on the next 2 cycles -She is due for Pap but we decided to delay since she is menstruating today. She will follow-up in 5 weeks to have Pap done  Patient was given the opportunity to ask questions.  Patient verbalized understanding of the plan and was able to repeat key elements of the plan.  [employer] interpreter used during this encounter.  No orders of the defined types were placed in this encounter.    Requested Prescriptions    No prescriptions requested or ordered in this encounter    Return in about 6 weeks (around 04/23/2017) for pap.  Karle Plumber, MD, FACP

## 2017-03-12 NOTE — Progress Notes (Signed)
Pt states once she got the nexplanon she has been havng pain in ovaries Pt states the pain is strong Pt states she use to have pain with periods but now the pain is stronger like when youre having a bay

## 2017-04-26 ENCOUNTER — Ambulatory Visit: Payer: Self-pay | Attending: Internal Medicine

## 2017-04-30 ENCOUNTER — Other Ambulatory Visit: Payer: Self-pay | Admitting: Internal Medicine

## 2017-10-22 ENCOUNTER — Ambulatory Visit: Payer: Self-pay

## 2018-01-21 ENCOUNTER — Encounter: Payer: Self-pay | Admitting: Internal Medicine

## 2018-01-21 ENCOUNTER — Ambulatory Visit: Payer: Self-pay | Attending: Internal Medicine | Admitting: Nurse Practitioner

## 2018-01-21 VITALS — BP 114/75 | HR 87 | Temp 98.2°F | Resp 16 | Wt 175.6 lb

## 2018-01-21 DIAGNOSIS — G43109 Migraine with aura, not intractable, without status migrainosus: Secondary | ICD-10-CM

## 2018-01-21 DIAGNOSIS — Z3009 Encounter for other general counseling and advice on contraception: Secondary | ICD-10-CM

## 2018-01-21 DIAGNOSIS — Z308 Encounter for other contraceptive management: Secondary | ICD-10-CM | POA: Insufficient documentation

## 2018-01-21 DIAGNOSIS — K089 Disorder of teeth and supporting structures, unspecified: Secondary | ICD-10-CM

## 2018-01-21 MED ORDER — SUMATRIPTAN SUCCINATE 50 MG PO TABS
ORAL_TABLET | ORAL | 3 refills | Status: DC
Start: 2018-01-21 — End: 2018-12-24

## 2018-01-21 MED FILL — SUMAtriptan SUCCINATE 50 MG: 50 | 30 days supply | Qty: 9 | Fill #0

## 2018-01-21 NOTE — Progress Notes (Signed)
Assessment & Plan:  Regina Shea was seen today for follow-up.  Diagnoses and all orders for this visit:  Migraine with aura and without status migrainosus, not intractable -     SUMAtriptan (IMITREX) 50 MG tablet; Take 1 tab PO at start of Headache.  May repeat in 2 hrs x one if HA persists.  Limit to 2 tabs/24 Avoid migraine triggers such as alcohol, wine, cheese, caffeine  Encounter for counseling regarding contraception Referral has been made to [facility] for nexplanon removal  Poor dentition -     Ambulatory referral to Dentistry    Patient has been counseled on age-appropriate routine health concerns for screening and prevention. These are reviewed and up-to-date. Referrals have been placed accordingly. Immunizations are up-to-date or declined.    Subjective:   Chief Complaint  Patient presents with  . Follow-up   HPI Regina Shea 37 y.o. female presents to office today requesting nexplanon removal. She has been referred to the health department and scheduled for removal. She is sexually active however denies any current GU symptoms. She is requesting a referral to the dentist as well as a refill of her imitrex.    Chronic Migraine Headaches Well controlled on imitrex which she takes sparingly. Other symptoms include nausea and vomiting. Relieving factors: lying down; low lights.   Review of Systems  Constitutional: Negative for fever, malaise/fatigue and weight loss.  HENT: Negative.  Negative for nosebleeds.   Eyes: Negative.  Negative for blurred vision, double vision and photophobia.  Respiratory: Negative.  Negative for cough and shortness of breath.   Cardiovascular: Negative.  Negative for chest pain, palpitations and leg swelling.  Gastrointestinal: Negative.  Negative for heartburn, nausea and vomiting.  Musculoskeletal: Negative.  Negative for myalgias.  Neurological: Positive for headaches. Negative for dizziness, focal weakness and seizures.    Psychiatric/Behavioral: Negative.  Negative for suicidal ideas.    Past Medical History:  Diagnosis Date  . Asthma     Past Surgical History:  Procedure Laterality Date  . NO PAST SURGERIES      Family History  Problem Relation Age of Onset  . Hypertension Mother   . Hypertension Father     Social History Reviewed with no changes to be made today.   Outpatient Medications Prior to Visit  Medication Sig Dispense Refill  . Prenatal Vit-Fe Fumarate-FA (PRENATAL MULTIVITAMIN) TABS tablet Take 1 tablet by mouth daily at 12 noon.    . SUMAtriptan (IMITREX) 50 MG tablet Take 1 tab PO at start of Headache.  May repeat in 2 hrs x one if HA persists.  Limit to 2 tabs/24 (Patient not taking: Reported on 03/12/2017) 10 tablet 3   No facility-administered medications prior to visit.     No Known Allergies     Objective:    BP 114/75   Pulse 87   Temp 98.2 F (36.8 C) (Oral)   Resp 16   Wt 175 lb 9.6 oz (79.7 kg)   SpO2 96%   BMI 33.18 kg/m  Wt Readings from Last 3 Encounters:  01/21/18 175 lb 9.6 oz (79.7 kg)  03/12/17 168 lb (76.2 kg)  02/14/17 168 lb 6.4 oz (76.4 kg)    Physical Exam  Constitutional: She is oriented to person, place, and time. She appears well-developed and well-nourished. She is cooperative.  HENT:  Head: Normocephalic and atraumatic.  Mouth/Throat: Oropharynx is clear and moist and mucous membranes are normal. No oral lesions. Abnormal dentition. Dental caries present. No dental abscesses.  Eyes: EOM are normal.  Neck: Normal range of motion.  Cardiovascular: Normal rate, regular rhythm and normal heart sounds. Exam reveals no gallop and no friction rub.  No murmur heard. Pulmonary/Chest: Effort normal and breath sounds normal. No tachypnea. No respiratory distress. She has no decreased breath sounds. She has no wheezes. She has no rhonchi. She has no rales. She exhibits no tenderness.  Abdominal: Soft. Bowel sounds are normal.   Musculoskeletal: Normal range of motion. She exhibits no edema.  Neurological: She is alert and oriented to person, place, and time. No sensory deficit. Coordination normal.  Skin: Skin is warm and dry.  Psychiatric: She has a normal mood and affect. Her behavior is normal. Judgment and thought content normal.  Nursing note and vitals reviewed.      Patient has been counseled extensively about nutrition and exercise as well as the importance of adherence with medications and regular follow-up. The patient was given clear instructions to go to ER or return to medical center if symptoms don't improve, worsen or new problems develop. The patient verbalized understanding.   Follow-up: Return if symptoms worsen or fail to improve.   Gildardo Pounds, FNP-BC [facility] and [place], [place]   01/21/2018, 5:51 PM

## 2018-01-21 NOTE — Patient Instructions (Addendum)
Cita: [facility]  [place] 17, 2019 a las 1:30p.m. llegar a la 1:00p.m.  Favor de traer 2 talones de cheque mas recientes.  Identificacion Favor de no traer ninos a la cita Favor de informale a la enfermera que quiere que le remuevan el Nexplanon

## 2018-01-21 NOTE — Progress Notes (Signed)
Pt is requesting to have her nexplanon removed  Pt states that since she has had the nexplanon she has gained weght and her  Pt is wanting to speak with pcp to see what other birth control methods is there and best for her

## 2018-03-28 ENCOUNTER — Ambulatory Visit: Payer: Self-pay

## 2018-03-28 ENCOUNTER — Ambulatory Visit: Payer: Self-pay | Attending: Family Medicine

## 2018-04-16 MED FILL — SUMATRIPTAN SUCC 50 MG TAB: 50 | 30 days supply | Qty: 9 | Fill #1

## 2018-04-17 MED FILL — IBUPROFEN 800 MG TABLET: 800 | 7 days supply | Qty: 30 | Fill #0

## 2018-04-17 MED FILL — AMOXICILLIN 500 MG CAPSULE: 500 | 14 days supply | Qty: 56 | Fill #0

## 2018-05-06 ENCOUNTER — Ambulatory Visit: Payer: Self-pay | Admitting: Internal Medicine

## 2018-07-09 ENCOUNTER — Ambulatory Visit: Payer: Self-pay | Attending: Nurse Practitioner | Admitting: Nurse Practitioner

## 2018-07-09 ENCOUNTER — Encounter: Payer: Self-pay | Admitting: Nurse Practitioner

## 2018-07-09 VITALS — BP 121/79 | HR 86 | Temp 99.1°F | Wt 181.0 lb

## 2018-07-09 DIAGNOSIS — K603 Anal fistula: Secondary | ICD-10-CM

## 2018-07-09 DIAGNOSIS — K610 Anal abscess: Secondary | ICD-10-CM

## 2018-07-09 DIAGNOSIS — Z79899 Other long term (current) drug therapy: Secondary | ICD-10-CM | POA: Insufficient documentation

## 2018-07-09 DIAGNOSIS — K602 Anal fissure, unspecified: Secondary | ICD-10-CM | POA: Insufficient documentation

## 2018-07-09 DIAGNOSIS — Z8249 Family history of ischemic heart disease and other diseases of the circulatory system: Secondary | ICD-10-CM | POA: Insufficient documentation

## 2018-07-09 MED ORDER — TRAMADOL HCL 50 MG PO TABS: 50.0000 mg | ORAL_TABLET | Freq: Two times a day (BID) | ORAL | 0 refills | Status: AC | PRN

## 2018-07-09 MED ORDER — AMOXICILLIN-POT CLAVULANATE 875-125 MG PO TABS: 1.0000 | ORAL_TABLET | Freq: Two times a day (BID) | ORAL | 0 refills | Status: AC

## 2018-07-09 MED FILL — traMADol HCL 50 MG TABS: 50 | 14 days supply | Qty: 30 | Fill #0

## 2018-07-09 MED FILL — AMOX-CLAV 875-125 MG TABLET: 875-125 | 7 days supply | Qty: 14 | Fill #0

## 2018-07-09 NOTE — Progress Notes (Signed)
Assessment & Plan:  Diagnoses and all orders for this visit:  Anal fissure and fistula -     HSV Type I/II IgG, IgMw/ reflex -     Ambulatory referral to General Surgery -     amoxicillin-clavulanate (AUGMENTIN) 875-125 MG tablet; Take 1 tablet by mouth 2 (two) times daily for 7 days. -     traMADol (ULTRAM) 50 MG tablet; Take 1 tablet (50 mg total) by mouth every 12 (twelve) hours as needed for up to 14 days.    Patient has been counseled on age-appropriate routine health concerns for screening and prevention. These are reviewed and up-to-date. Referrals have been placed accordingly. Immunizations are up-to-date or declined.    Subjective:  HPI Regina Shea 37 y.o. female presents to office today with complaints of painful area between buttocks and inner thigh area.  She endorses multiple lesions in the area. She states she was sent an antibiotic from her home country and has been taking it (amoxiciilin with no relief of pain, redness or swelling).  The abscesses have been present for 2.5 months. She has been trying to drain them manually which has made them worse.   Abscess: Patient presents for evaluation of a cutaneous abscess. Lesion is located in the left, left sided, left greater than right buttock. Onset was 2 months ago. Symptoms have gradually worsened. Abscess has associated symptoms of spontaneous drainage, pain. Patient does not have previous history of cutaneous abscesses. Patient does not have diabetes.   Review of Systems  Constitutional: Negative for fever, malaise/fatigue and weight loss.  HENT: Negative.  Negative for nosebleeds.   Eyes: Negative.  Negative for blurred vision, double vision and photophobia.  Respiratory: Negative.  Negative for cough and shortness of breath.   Cardiovascular: Negative.  Negative for chest pain, palpitations and leg swelling.  Gastrointestinal: Negative.  Negative for heartburn, nausea and vomiting.  Musculoskeletal:  Negative.  Negative for myalgias.  Skin:       SEE HPI  Neurological: Negative.  Negative for dizziness, focal weakness, seizures and headaches.  Psychiatric/Behavioral: Negative.  Negative for suicidal ideas.    Past Medical History:  Diagnosis Date  . Asthma     Past Surgical History:  Procedure Laterality Date  . NO PAST SURGERIES      Family History  Problem Relation Age of Onset  . Hypertension Mother   . Hypertension Father     Social History Reviewed with no changes to be made today.   Outpatient Medications Prior to Visit  Medication Sig Dispense Refill  . SUMAtriptan (IMITREX) 50 MG tablet Take 1 tab PO at start of Headache.  May repeat in 2 hrs x one if HA persists.  Limit to 2 tabs/24 10 tablet 3  . Prenatal Vit-Fe Fumarate-FA (PRENATAL MULTIVITAMIN) TABS tablet Take 1 tablet by mouth daily at 12 noon.     No facility-administered medications prior to visit.     No Known Allergies     Objective:    BP 121/79   Pulse 86   Temp 99.1 F (37.3 C) (Oral)   Wt 181 lb (82.1 kg)   SpO2 99%   BMI 34.20 kg/m  Wt Readings from Last 3 Encounters:  07/09/18 181 lb (82.1 kg)  01/21/18 175 lb 9.6 oz (79.7 kg)  03/12/17 168 lb (76.2 kg)    Physical Exam Vitals signs and nursing note reviewed.  Constitutional:      Appearance: She is well-developed.  HENT:  Head: Normocephalic and atraumatic.  Neck:     Musculoskeletal: Normal range of motion.  Cardiovascular:     Rate and Rhythm: Normal rate and regular rhythm.     Heart sounds: Normal heart sounds. No murmur. No friction rub. No gallop.   Pulmonary:     Effort: Pulmonary effort is normal. No tachypnea or respiratory distress.     Breath sounds: Normal breath sounds. No decreased breath sounds, wheezing, rhonchi or rales.  Chest:     Chest wall: No tenderness.  Abdominal:     General: Bowel sounds are normal.     Palpations: Abdomen is soft.  Genitourinary:   Musculoskeletal: Normal range of  motion.  Skin:    General: Skin is warm and dry.  Neurological:     Mental Status: She is alert and oriented to person, place, and time.     Coordination: Coordination normal.  Psychiatric:        Behavior: Behavior normal. Behavior is cooperative.        Thought Content: Thought content normal.        Judgment: Judgment normal.          Patient has been counseled extensively about nutrition and exercise as well as the importance of adherence with medications and regular follow-up. The patient was given clear instructions to go to ER or return to medical center if symptoms don't improve, worsen or new problems develop. The patient verbalized understanding.   Follow-up: Return in about 3 weeks (around 07/30/2018) for abscess.   Gildardo Pounds, FNP-BC [facility] and [place], [place]   07/15/2018, 8:10 AM

## 2018-07-09 NOTE — Progress Notes (Signed)
Patient has sore on vaginal area.

## 2018-07-09 NOTE — Patient Instructions (Signed)
Anal Fistula An anal fistula is an abnormal tunnel that develops between the bowel and the skin near the outside of the anus, where stool (feces) comes out. The anus has many tiny glands that make lubricating fluid. Sometimes, these glands become plugged and infected, and that can cause a fluid-filled pocket (abscess) to form. An anal fistula often develops after this infection or abscess. What are the causes? In most cases, an anal fistula is caused by a past or current anal abscess. Other causes include:  A complication of surgery.  Trauma to the rectal area.  Radiation to the area.  Medical conditions or diseases, such as: ? Chronic inflammatory bowel disease, such as Crohn disease or ulcerative colitis. ? Colon cancer or rectal cancer. ? Diverticular disease, such as diverticulitis. ? An STD (sexually transmitted disease), such as gonorrhea, chlamydia, or syphilis. ? An infection that is caused by HIV (human immunodeficiency virus). ? Foreign body in the rectum.  What are the signs or symptoms? Symptoms of this condition include:  Throbbing or constant pain that may be worse while you are sitting.  Swelling or irritation around the anus.  Drainage of pus or blood from an opening near the anus.  Pain with bowel movements.  Fever or chills.  How is this diagnosed? Your health care provider will examine the area to find the openings of the anal fistula and the fistula tract. The external opening of the anal fistula may be seen during a physical exam. You may also have tests, including:  An exam of the rectal area with a gloved hand (digital rectal exam).  An exam with a probe or scope to help locate the internal opening of the fistula.  Imaging tests to find the exact location and path of the fistula. These tests may include X-rays, an ultrasound, a CT scan, or MRI. The path is made visible by a dye that is injected into the fistula opening.  You may have other tests to find  the cause of the anal fistula. How is this treated? The most common treatment for an anal fistula is surgery. The type of surgery that is used will depend on where the fistula is located and how complex the fistula is. Surgical options include:  A fistulotomy. The whole fistula is opened up, and the contents are drained to promote healing.  Seton placement. A silk string (seton) is placed into the fistula during a fistulotomy. This helps to drain any infection to promote healing.  Advancement flap procedure. Tissue is removed from your rectum or the skin around the anus and is attached to the opening of the fistula.  Bioprosthetic plug. A cone-shaped plug is made from your tissue and is used to block the opening of the fistula.  Some anal fistulas do not require surgery. A nonsurgical treatment option involves injecting a fibrin glue to seal the fistula. You also may be prescribed an antibiotic medicine to treat an infection. Follow these instructions at home: Medicines  Take over-the-counter and prescription medicines only as told by your health care provider.  If you were prescribed an antibiotic medicine, take it as told by your health care provider. Do not stop taking the antibiotic even if you start to feel better.  Use a stool softener or a laxative if told to do so by your health care provider. General instructions  Eat a high-fiber diet as told by your health care provider. This can help to prevent constipation.  Drink enough fluid to keep your  urine clear or pale yellow.  Take a warm sitz bath for 15-20 minutes, 3-4 times per day, or as told by your health care provider. Sitz baths can ease your pain and discomfort and help with healing.  Follow good hygiene to keep the anal area as clean and dry as possible. Use wet toilet paper or a moist towelette after each bowel movement.  Keep all follow-up visits as told by your health care provider. This is important. Contact a health  care provider if:  You have increased pain that is not controlled with medicines.  You have new redness or swelling around the anal area.  You have new fluid, blood, or pus coming from the anal area.  You have tenderness or warmth around the anal area. Get help right away if:  You have a fever.  You have severe pain.  You have chills or diarrhea.  You have severe problems urinating or having a bowel movement. This information is not intended to replace advice given to you by your health care provider. Make sure you discuss any questions you have with your health care provider. Document Released: 06/28/2008 Document Revised: 12/22/2015 Document Reviewed: 10/11/2014 Elsevier Interactive Patient Education  2018 [employer].  Perianal Abscess An abscess is an infected area that is filled with pus. A perianal abscess occurs in the perineum, which is the area between the anus and the scrotum in males and between the anus and the vagina in females. Perianal abscesses can vary in size. Without treatment, a perianal abscess can become larger and cause other problems. What are the causes? This condition is caused by:  Waste from damaged or dead tissue (debris) that plugs up glands in the perineum. When this happens, an abscess may form.  Infections of the perineum.  What are the signs or symptoms? Common symptoms of this condition include:  Swelling and redness in the area of the abscess. The redness may go beyond the abscess and appear as a red streak on the skin.  Pain in the area of the abscess, including pain when sitting, walking, or passing stool.  Other possible symptoms include:  A visible, painful lump, or a lump that can be felt when touched.  Bleeding or pus-like discharge from the area.  Fever.  General weakness.  How is this diagnosed? This condition is diagnosed based on your medical history and a physical exam of the affected area.  This may involve examining  the rectal area with a gloved hand (digital rectal exam).  Sometimes, the health care provider needs to look into the rectum using a probe or a scope.  For women, it may require a careful vaginal exam.  How is this treated? Treatment for this condition may include:  Making a cut (incision) in the abscess to drain the pus. This can sometimes be done in your health care provider's office or an emergency department after you are given medicine to numb the area (local anesthetic).  Surgery to drain the abscess. This is for larger or deeper abscesses.  Antibiotic medicines, if there is infection in the surrounding tissue (cellulitis).  Having gauze packed into the abscess to continue draining the area.  Frequent baths in warm water that is deep enough to cover your hips and buttocks (sitz baths). These help the wound heal and they make the abscess less likely to come back.  Follow these instructions at home: Medicines  Take over-the-counter and prescription medicines for pain, fever, or discomfort only as told by  your health care provider.  If you were prescribed an antibiotic medicine, use it as told by your health care provider. Do not stop using the antibiotic even if you start to feel better.  Do not drive or use heavy machinery while taking prescription pain medicine. Wound care   Keep the skin around the wound clean and dry. Avoid cleaning the area too much.  Avoid scratching the wound.  Avoid using colored or perfumed toilet papers.  Take a sitz bath 3-4 times a day and after bowel movements. This will help reduce pain and swelling.  If directed, apply ice to the injured area: ? Put ice in a plastic bag. ? Place a towel between your skin and the bag. ? Leave the ice on for 20 minutes, 2-3 times a day.  Check your incision area every day for signs of infection. Check for: ? More redness, swelling, or pain. ? More fluid or blood. ? Warmth. ? Pus or a bad  smell. Gauze  If gauze was used in the abscess, follow instructions from your health care provider about removing or changing the gauze. It can usually be removed in 2-3 days.  Wash your hands with soap and water before you remove or change your gauze. If soap and water are not available, use hand sanitizer.  If one or more drains were placed in the abscess cavity, be careful not to pull at them. Your health care provider will tell you how long they need to remain in place. General instructions  Keep all follow-up visits as told by your health care provider. This is important. Contact a health care provider if:  You have trouble passing stool or passing urine.  Your pain or swelling in the affected area does not seem to be getting better.  The gauze packing or the drains come out before the planned time. Get help right away if:  You have problems moving or using your legs.  You have severe or increasing pain.  Your swelling in the affected area suddenly gets worse.  You have a large increase in bleeding or passing of pus.  You have chills or a fever. This information is not intended to replace advice given to you by your health care provider. Make sure you discuss any questions you have with your health care provider. Document Released: 08/22/2006 Document Revised: 02/03/2016 Document Reviewed: 12/26/2015 Elsevier Interactive Patient Education  [employer].

## 2018-07-11 LAB — HSV TYPE I/II IGG, IGMW/ REFLEX
HSV 1 Glycoprotein G Ab, IgG: 32.9 index — ABNORMAL HIGH (ref 0.00–0.90)
HSV 1 IgM: <1:10 {titer}
HSV 2 IgG, Type Spec: <0.91 index (ref 0.00–0.90)

## 2018-07-15 ENCOUNTER — Other Ambulatory Visit: Payer: Self-pay | Admitting: Nurse Practitioner

## 2018-07-15 ENCOUNTER — Encounter: Payer: Self-pay | Admitting: Nurse Practitioner

## 2018-07-15 MED ORDER — ACYCLOVIR 400 MG PO TABS
400.0000 mg | ORAL_TABLET | Freq: Three times a day (TID) | ORAL | 0 refills | Status: AC
Start: 2018-07-15 — End: 2018-07-22

## 2018-07-15 MED FILL — ACYCLOVIR 400 MG TABLET: 400 | 7 days supply | Qty: 21 | Fill #0

## 2018-07-15 NOTE — Progress Notes (Signed)
Patient has an appointment 07/28/18 @ 9:15am

## 2018-07-16 NOTE — Progress Notes (Signed)
Please call patient and let her know she has an appointment with general surgery

## 2018-07-21 NOTE — Progress Notes (Signed)
Noted . Patient aware of her appointment

## 2018-07-28 ENCOUNTER — Ambulatory Visit (INDEPENDENT_AMBULATORY_CARE_PROVIDER_SITE_OTHER): Payer: Self-pay | Admitting: General Surgery

## 2018-07-28 ENCOUNTER — Other Ambulatory Visit: Payer: Self-pay

## 2018-07-28 ENCOUNTER — Encounter: Payer: Self-pay | Admitting: General Surgery

## 2018-07-28 VITALS — BP 135/85 | HR 82 | Temp 97.9°F | Resp 16 | Ht 65.0 in | Wt 183.4 lb

## 2018-07-28 DIAGNOSIS — L0231 Cutaneous abscess of buttock: Secondary | ICD-10-CM

## 2018-07-28 MED ORDER — AMOXICILLIN-POT CLAVULANATE 875-125 MG PO TABS: 1.0000 | ORAL_TABLET | Freq: Two times a day (BID) | ORAL | 0 refills | Status: AC

## 2018-07-28 MED FILL — AMOX-CLAV 875-125 MG TABLET: 875-125 | 7 days supply | Qty: 14 | Fill #0

## 2018-07-28 NOTE — Progress Notes (Signed)
Patient ID: Regina She[place], female   DOB: 25-Jan-1981, 37 y.o.   MRN: [id number]  Chief Complaint  Patient presents with  . New Patient (Initial Visit)    anal fistula    HPI Reg[place] She[place] is a 37 y.o. female.   She is referred by her primary care provider, Geryl Rankins, nurse practitioner, for evaluation of possible anal fistula, perirectal abscess.  This interview was conducted with the assistance of a Spanish language interpreter.  [place] states that she has had boils and cutaneous abscesses in her perineal area for about the past 4 months.  Also describes a feeling of incomplete evacuation with bowel movements.  She denies constipation.  She has never seen any blood from her rectum.  She does state that when she has a bowel movement, it is not painful at the time, but that she has pain in the area for the rest of the day.  She was placed on antibiotics.  She says that she had fever and chills prior to taking the antibiotics but since being on them she has not experienced these symptoms.  She says that the antibiotics also improved the pain in her perineum.  She reports having had 4 children, vaginally delivered.  She says she had an episiotomy with her first child and had subsequent perineal tearing with 2 other children.  She denies any fecal incontinence.   Past Medical History:  Diagnosis Date  . Asthma     Past Surgical History:  Procedure Laterality Date  . NO PAST SURGERIES      Family History  Problem Relation Age of Onset  . Hypertension Mother   . Hypertension Father   . Cancer Maternal Uncle     Social History Social History   Tobacco Use  . Smoking status: Never Smoker  . Smokeless tobacco: Never Used  Substance Use Topics  . Alcohol use: No  . Drug use: No    No Known Allergies  Current Outpatient Medications  Medication Sig Dispense Refill  . acyclovir (ZOVIRAX) 400 MG tablet Take 400 mg by mouth 5 (five) times daily.    . SUMAtriptan  (IMITREX) 50 MG tablet Take 1 tab PO at start of Headache.  May repeat in 2 hrs x one if HA persists.  Limit to 2 tabs/24 10 tablet 3  . traMADol (ULTRAM) 50 MG tablet Take by mouth every 6 (six) hours as needed.    Marland Kitchen amoxicillin-clavulanate (AUGMENTIN) 875-125 MG tablet Take 1 tablet by mouth 2 (two) times daily for 7 days. 14 tablet 0   No current facility-administered medications for this visit.     Review of Systems Review of Systems  All other systems reviewed and are negative.   Blood pressure 135/85, pulse 82, temperature 97.9 F (36.6 C), temperature source Temporal, resp. rate 16, height 5\' 5"  (1.651 m), weight 183 lb 6.4 oz (83.2 kg), last menstrual period 01/26/2018, SpO2 98 %, not currently breastfeeding.  Physical Exam Physical Exam Exam conducted with a chaperone present.  Constitutional:      General: She is not in acute distress.    Appearance: Normal appearance. She is not toxic-appearing.  HENT:     Head: Normocephalic and atraumatic.     Nose: Nose normal.     Mouth/Throat:     Mouth: Mucous membranes are moist.     Pharynx: No oropharyngeal exudate.  Eyes:     General: No scleral icterus.    Conjunctiva/sclera: Conjunctivae normal.     Pupils:  Pupils are equal, round, and reactive to light.  Neck:     Musculoskeletal: Normal range of motion. Muscular tenderness present.  Cardiovascular:     Rate and Rhythm: Normal rate and regular rhythm.     Pulses: Normal pulses.     Heart sounds: Normal heart sounds.  Pulmonary:     Effort: Pulmonary effort is normal.     Breath sounds: Normal breath sounds.  Abdominal:     General: There is no distension.     Palpations: Abdomen is soft.     Tenderness: There is no abdominal tenderness.  Genitourinary:    General: Normal vulva.     Exam position: Knee-chest position.     Pubic Area: No rash.      Vagina: No vaginal discharge.     Rectum: Guaiac result negative. No mass, anal fissure, external hemorrhoid or  internal hemorrhoid.       Comments: Area with healing cutaneous abscess.  No masses or fluctuance appreciated on rectal exam. There is an area that feels more fibrotic between the rectum and vagina, but no exquisite tenderness on exam. Musculoskeletal: Normal range of motion.        General: No swelling.  Skin:    General: Skin is warm and dry.     Findings: No rash.  Neurological:     General: No focal deficit present.     Mental Status: She is alert and oriented to person, place, and time.  Psychiatric:        Mood and Affect: Mood normal.        Behavior: Behavior normal.     Data Reviewed EMR notes from PCP  Assessment    Based on my exam, I was unable to appreciate any fistula or fissure.  Her symptoms do not seem completely consistent with this, either.  I did not appreciate a perirectal abscess.  Her discomfort has improved with antibiotics, but is incompletely resolved.    Plan    We will give her another week's worth of Augmentin.  She will continue to bathe with antibacterial soap; she may also apply topical antibiotic ointment to the healing abscess area.  Will return for repeat evaluation in 1 week's time.  She may require an exam under anesthesia to better evaluate her anorectal area.  I cannot explain her feeling of incomplete evacuation based on my exam or her history, however it may be related to her 4 prior vaginal births, one with episiotomy and 2 with perineal tearing.  We will reevaluate in 1 week and make further plans based upon that visit.       Fredirick Maudlin 07/28/2018, 9:59 AM

## 2018-07-28 NOTE — Patient Instructions (Addendum)
You may try using an anti bacteria soap to wash the area.   You may also use antibiotic ointment twice daily  to the area as well.   Please pick up your medication at the pharmacy. We will see you back in one week to see if you are better.

## 2018-08-11 ENCOUNTER — Ambulatory Visit: Payer: Self-pay | Admitting: Nurse Practitioner

## 2018-08-11 ENCOUNTER — Ambulatory Visit: Payer: Self-pay | Admitting: General Surgery

## 2018-08-26 ENCOUNTER — Encounter: Payer: Self-pay | Admitting: Nurse Practitioner

## 2018-08-26 ENCOUNTER — Ambulatory Visit: Payer: Self-pay | Attending: Nurse Practitioner | Admitting: Nurse Practitioner

## 2018-08-26 VITALS — BP 116/75 | HR 90 | Temp 98.6°F | Resp 18 | Ht 65.0 in | Wt 182.0 lb

## 2018-08-26 DIAGNOSIS — Z79899 Other long term (current) drug therapy: Secondary | ICD-10-CM | POA: Insufficient documentation

## 2018-08-26 DIAGNOSIS — Z809 Family history of malignant neoplasm, unspecified: Secondary | ICD-10-CM | POA: Insufficient documentation

## 2018-08-26 DIAGNOSIS — L0231 Cutaneous abscess of buttock: Secondary | ICD-10-CM | POA: Insufficient documentation

## 2018-08-26 MED ORDER — SULFAMETHOXAZOLE-TRIMETHOPRIM 800-160 MG PO TABS: 1.0000 | ORAL_TABLET | Freq: Two times a day (BID) | ORAL | 0 refills | Status: AC

## 2018-08-26 NOTE — Progress Notes (Signed)
Assessment & Plan:  Regina Shea was seen today for follow-up.  Diagnoses and all orders for this visit:  Cutaneous abscess of buttock -     sulfamethoxazole-trimethoprim (BACTRIM DS,SEPTRA DS) 800-160 MG tablet; Take 1 tablet by mouth 2 (two) times daily for 7 days. Continue topical antibiotic ointment application twice per day  Patient has been counseled on age-appropriate routine health concerns for screening and prevention. These are reviewed and up-to-date. Referrals have been placed accordingly. Immunizations are up-to-date or declined.    Subjective:   Chief Complaint  Patient presents with  . Follow-up   HPI Regina Shea 38 y.o. female presents to office today for follow up to cutaneous abscess. VRI was used to communicate directly with patient for the entire encounter including providing detailed patient instructions.   She was referred to and evaluated by General surgery on 07-28-2018 for the above complaint. She was given an additional round of augmentin and encouraged to continue antibacterial soap and application of topical antibiotic ointment to healing abscess area. She was encouraged to make a follow up appointment in one week however she did not follow up and is here today with complaints that the left buttock abscess has not resolved despite compliance with treatment. She declines exam today and I have encouraged her to follow up with general surgery if the abscess has worsened. Will try bactrim for a few days however she needs to have follow up with surgery for this. She denies any fevers or purulent drainage.   Review of Systems  Constitutional: Negative for fever, malaise/fatigue and weight loss.  HENT: Negative.  Negative for nosebleeds.   Eyes: Negative.  Negative for blurred vision, double vision and photophobia.  Respiratory: Negative.  Negative for cough and shortness of breath.   Cardiovascular: Negative.  Negative for chest pain, palpitations and leg  swelling.  Gastrointestinal: Negative.  Negative for heartburn, nausea and vomiting.  Musculoskeletal: Negative.  Negative for myalgias.  Skin:       SEE  HPI  Neurological: Negative.  Negative for dizziness, focal weakness, seizures and headaches.  Psychiatric/Behavioral: Negative.  Negative for suicidal ideas.    Past Medical History:  Diagnosis Date  . Asthma     Past Surgical History:  Procedure Laterality Date  . NO PAST SURGERIES      Family History  Problem Relation Age of Onset  . Hypertension Mother   . Hypertension Father   . Cancer Maternal Uncle     Social History Reviewed with no changes to be made today.   Outpatient Medications Prior to Visit  Medication Sig Dispense Refill  . SUMAtriptan (IMITREX) 50 MG tablet Take 1 tab PO at start of Headache.  May repeat in 2 hrs x one if HA persists.  Limit to 2 tabs/24 10 tablet 3  . traMADol (ULTRAM) 50 MG tablet Take by mouth every 6 (six) hours as needed.    Marland Kitchen acyclovir (ZOVIRAX) 400 MG tablet Take 400 mg by mouth 5 (five) times daily.     No facility-administered medications prior to visit.     No Known Allergies     Objective:    BP 116/75 (BP Location: Right Arm, Patient Position: Sitting, Cuff Size: Normal)   Pulse 90   Temp 98.6 F (37 C) (Oral)   Resp 18   Ht 5\' 5"  (1.651 m)   Wt 182 lb (82.6 kg)   SpO2 100%   BMI 30.29 kg/m  Wt Readings from Last 3 Encounters:  08/26/18 182  lb (82.6 kg)  07/28/18 183 lb 6.4 oz (83.2 kg)  07/09/18 181 lb (82.1 kg)    Physical Exam Vitals signs and nursing note reviewed.  Constitutional:      Appearance: She is well-developed.  HENT:     Head: Normocephalic and atraumatic.  Neck:     Musculoskeletal: Normal range of motion.  Cardiovascular:     Rate and Rhythm: Normal rate and regular rhythm.     Heart sounds: Normal heart sounds. No murmur. No friction rub. No gallop.   Pulmonary:     Effort: Pulmonary effort is normal. No tachypnea or respiratory  distress.     Breath sounds: Normal breath sounds. No decreased breath sounds, wheezing, rhonchi or rales.  Chest:     Chest wall: No tenderness.  Abdominal:     General: Bowel sounds are normal.     Palpations: Abdomen is soft.  Genitourinary:    Comments: Patient declines exam today Musculoskeletal: Normal range of motion.  Skin:    General: Skin is warm and dry.  Neurological:     Mental Status: She is alert and oriented to person, place, and time.     Coordination: Coordination normal.  Psychiatric:        Behavior: Behavior normal. Behavior is cooperative.        Thought Content: Thought content normal.        Judgment: Judgment normal.          Patient has been counseled extensively about nutrition and exercise as well as the importance of adherence with medications and regular follow-up. The patient was given clear instructions to go to ER or return to medical center if symptoms don't improve, worsen or new problems develop. The patient verbalized understanding.   Follow-up: No follow-ups on file.   Gildardo Pounds, FNP-BC [facility] [place], [place]   08/27/2018, 10:26 PM

## 2018-08-27 ENCOUNTER — Encounter: Payer: Self-pay | Admitting: Nurse Practitioner

## 2018-08-27 MED FILL — SULFAMETHOXAZOLE-TMP DS TAB: 800-160 | 7 days supply | Qty: 14 | Fill #0

## 2018-10-07 ENCOUNTER — Encounter: Payer: Self-pay | Admitting: *Deleted

## 2018-10-08 ENCOUNTER — Telehealth: Payer: Self-pay | Admitting: Nurse Practitioner

## 2018-10-08 ENCOUNTER — Other Ambulatory Visit: Payer: Self-pay | Admitting: Nurse Practitioner

## 2018-10-08 NOTE — Telephone Encounter (Signed)
Will route to PCP to put a new urgent general surgery referral for a possible anal fistula.

## 2018-10-08 NOTE — Telephone Encounter (Signed)
Pt called in stated she was referred to office in [place] but DR there stated that she did not need procedure done tried to reschedule with a a different DR. There but has had no luck rescheduling would like help reaching the office she was referred to please follow up

## 2018-10-15 ENCOUNTER — Other Ambulatory Visit: Payer: Self-pay | Admitting: Nurse Practitioner

## 2018-10-15 DIAGNOSIS — K602 Anal fissure, unspecified: Secondary | ICD-10-CM

## 2018-10-15 DIAGNOSIS — K603 Anal fistula: Principal | ICD-10-CM

## 2018-10-15 NOTE — Telephone Encounter (Signed)
I have placed referral. Please let patient know that general surgery referrals are limited with the  financial. Also if the surgeon did not feel she needed surgery this may be the same opinion she receives from another surgeon. THank you

## 2018-10-20 NOTE — Telephone Encounter (Signed)
CMA left a Vm for patient and informed referral was placed.

## 2018-10-22 ENCOUNTER — Ambulatory Visit: Payer: Self-pay | Admitting: General Surgery

## 2018-11-06 ENCOUNTER — Telehealth: Payer: Self-pay | Admitting: *Deleted

## 2018-11-06 NOTE — Telephone Encounter (Signed)
I did call and speak with patient's husband, Trudee Grip, today via [place] interpreter Nester ID# [phone].  Patient's husband confirmed that they wanted to keep follow up with Dr. Celine Ahr.   He was notified that we would need to change appointment that was scheduled for 11-24-18 due to COVID-19.   Dr. Celine Ahr would like to do a telephonic visit on 11-26-18. This has been scheduled for 9:15 am. Language line will need to be used for the call.   Husband verbalizes understanding of the above.

## 2018-11-06 NOTE — Telephone Encounter (Signed)
I did call back and speak with patient's spouse, Trudee Grip, via [place] interpreter Mickel Baas ID# [phone].  Dr. Celine Ahr decided that she does need to see the patient in the office for a face to face visit at the end of the month instead of a telephone visit.   Patient's spouse aware of the above and verbalizes understanding.

## 2018-11-24 ENCOUNTER — Ambulatory Visit: Payer: Self-pay | Admitting: General Surgery

## 2018-11-25 ENCOUNTER — Encounter (HOSPITAL_COMMUNITY): Payer: Self-pay

## 2018-11-25 ENCOUNTER — Ambulatory Visit (HOSPITAL_COMMUNITY)
Admission: EM | Admit: 2018-11-25 | Discharge: 2018-11-25 | Disposition: A | Discharge status: Home / Self Care | Payer: Self-pay | Attending: Family Medicine | Admitting: Family Medicine

## 2018-11-25 DIAGNOSIS — S61511A Laceration without foreign body of right wrist, initial encounter: Secondary | ICD-10-CM

## 2018-11-25 NOTE — ED Triage Notes (Signed)
Laceration to right wrist observed, pt does not speak english , bleeding controlled , pules wnl

## 2018-11-25 NOTE — Discharge Instructions (Addendum)
Keep the skin clean and dry Watch for infection The tapes should fall off in 5 to 7 days Return for problems Your tetanus is up-to-date in 2014

## 2018-11-25 NOTE — ED Provider Notes (Signed)
[place]    CSN: [id number] Arrival date & time: 11/25/18  1534     History   Chief Complaint Chief Complaint  Patient presents with  . Laceration    HPI Regina Shea is a 38 y.o. female.   HPI  Patient is Spanish-speaking.  She is seen with the assistance of a Spanish interpreter and 38 year old.  She has a superficial laceration to the aspect of her right wrist.  She states it bled a lot.  She is worried she had a vein.  Bleeding is controlled with pressure.  She has good range of motion of her wrist hand and fingers.  Normal sensation.  Normal pulses.  Normal cap refill.  Past Medical History:  Diagnosis Date  . Asthma     Patient Active Problem List   Diagnosis Date Noted  . Cutaneous abscess of buttock 07/28/2018  . Nexplanon in place 02/14/2017    Past Surgical History:  Procedure Laterality Date  . NO PAST SURGERIES      OB History    Gravida  4   Para  4   Term  4   Preterm  0   AB  0   Living  4     SAB  0   TAB  0   Ectopic  0   Multiple  0   Live Births  4            Home Medications    Prior to Admission medications   Medication Sig Start Date End Date Taking? Authorizing Provider  acyclovir (ZOVIRAX) 400 MG tablet Take 400 mg by mouth 5 (five) times daily.    [provider]  SUMAtriptan (IMITREX) 50 MG tablet Take 1 tab PO at start of Headache.  May repeat in 2 hrs x one if HA persists.  Limit to 2 tabs/24 01/21/18   Gildardo Pounds, NP  traMADol (ULTRAM) 50 MG tablet Take by mouth every 6 (six) hours as needed.    [provider]    Family History Family History  Problem Relation Age of Onset  . Hypertension Mother   . Hypertension Father   . Cancer Maternal Uncle     Social History Social History   Tobacco Use  . Smoking status: Never Smoker  . Smokeless tobacco: Never Used  Substance Use Topics  . Alcohol use: No  . Drug use: No     Allergies   Patient has no  known allergies.   Review of Systems Review of Systems  Constitutional: Negative for chills and fever.  HENT: Negative for ear pain and sore throat.   Eyes: Negative for pain and visual disturbance.  Respiratory: Negative for cough and shortness of breath.   Cardiovascular: Negative for chest pain and palpitations.  Gastrointestinal: Negative for abdominal pain and vomiting.  Genitourinary: Negative for dysuria and hematuria.  Musculoskeletal: Negative for arthralgias and back pain.  Skin: Positive for wound. Negative for color change and rash.  Neurological: Negative for seizures and syncope.  All other systems reviewed and are negative.    Physical Exam Triage Vital Signs ED Triage Vitals  Enc Vitals Group     BP 11/25/18 1624 116/79     Pulse Rate 11/25/18 1624 91     Resp 11/25/18 1624 18     Temp 11/25/18 1624 98.6 F (37 C)     Temp src --      SpO2 11/25/18 1624 97 %     Weight --  Height --      Head Circumference --      Peak Flow --      Pain Score 11/25/18 1718 4     Pain Loc --      Pain Edu? --      Excl. in [place]? --    No data found.  Updated Vital Signs BP 116/79   Pulse 91   Temp 98.6 F (37 C)   Resp 18   LMP  (LMP Unknown)   SpO2 97%        Physical Exam Constitutional:      General: She is not in acute distress.    Appearance: She is well-developed.  HENT:     Head: Normocephalic and atraumatic.  Eyes:     Conjunctiva/sclera: Conjunctivae normal.     Pupils: Pupils are equal, round, and reactive to light.  Neck:     Musculoskeletal: Normal range of motion.  Cardiovascular:     Rate and Rhythm: Normal rate.  Pulmonary:     Effort: Pulmonary effort is normal. No respiratory distress.  Abdominal:     General: There is no distension.     Palpations: Abdomen is soft.  Musculoskeletal: Normal range of motion.  Skin:    General: Skin is warm and dry.     Comments: Volar aspect of right wrist over carpal region has a 3 cm  laceration that is in the shaved.  It is a flap.  It is easily closed.  It is not bleeding.  Neurological:     General: No focal deficit present.     Mental Status: She is alert.     Sensory: No sensory deficit.      UC Treatments / Results  Labs (all labs ordered are listed, but only abnormal results are displayed) Labs Reviewed - No data to display  EKG None  Radiology No results found.  Procedures Laceration Repair Date/Time: 11/25/2018 6:46 PM Performed by: Raylene Everts, MD Authorized by: Raylene Everts, MD   Consent:    Consent obtained:  Verbal   Consent given by:  Patient   Risks discussed:  Infection, pain and poor cosmetic result   Alternatives discussed:  No treatment Anesthesia (see MAR for exact dosages):    Anesthesia method:  None Laceration details:    Location:  Hand   Hand location:  R wrist   Length (cm):  3   Depth (mm):  5 Exploration:    Wound exploration: wound explored through full range of motion     Wound extent: no nerve damage noted, no tendon damage noted and no vascular damage noted   Treatment:    Area cleansed with:  Soap and water   Amount of cleaning:  Standard   Visualized foreign bodies/material removed: yes   Skin repair:    Repair method:  Steri-Strips Approximation:    Approximation:  Close Post-procedure details:    Dressing:  Bulky dressing   Patient tolerance of procedure:  Tolerated well, no immediate complications   (including critical care time)  Medications Ordered in UC Medications - No data to display  Initial Impression / Assessment and Plan / UC Course  I have reviewed the triage vital signs and the nursing notes.  Pertinent labs & imaging results that were available during my care of the patient were reviewed by me and considered in my medical decision making (see chart for details).     With the Spanish interpreter we discussed wound care.  Caring for Steri-Strips.  Watching for infection.   Reasons for return Final Clinical Impressions(s) / UC Diagnoses   Final diagnoses:  Wrist laceration, right, initial encounter     Discharge Instructions     Keep the skin clean and dry Watch for infection The tapes should fall off in 5 to 7 days Return for problems Your tetanus is up-to-date in 2014   ED Prescriptions    None     Controlled Substance Prescriptions [place] Controlled Substance Registry consulted? Not Applicable   Raylene Everts, MD 11/25/18 Valerie Roys

## 2018-11-26 ENCOUNTER — Ambulatory Visit: Payer: Self-pay | Admitting: General Surgery

## 2018-12-03 ENCOUNTER — Ambulatory Visit: Payer: Self-pay | Admitting: General Surgery

## 2018-12-24 ENCOUNTER — Encounter: Payer: Self-pay | Admitting: General Surgery

## 2018-12-24 ENCOUNTER — Other Ambulatory Visit: Payer: Self-pay | Admitting: *Deleted

## 2018-12-24 ENCOUNTER — Ambulatory Visit (INDEPENDENT_AMBULATORY_CARE_PROVIDER_SITE_OTHER): Payer: Self-pay | Admitting: General Surgery

## 2018-12-24 ENCOUNTER — Telehealth: Payer: Self-pay | Admitting: Nurse Practitioner

## 2018-12-24 ENCOUNTER — Ambulatory Visit
Admission: RE | Admit: 2018-12-24 | Discharge: 2018-12-24 | Disposition: A | Discharge status: Home / Self Care | Payer: Self-pay | Source: Ambulatory Visit | Attending: General Surgery | Admitting: General Surgery

## 2018-12-24 ENCOUNTER — Other Ambulatory Visit: Payer: Self-pay

## 2018-12-24 ENCOUNTER — Other Ambulatory Visit
Admission: RE | Admit: 2018-12-24 | Discharge: 2018-12-24 | Disposition: A | Discharge status: Home / Self Care | Payer: Self-pay | Source: Ambulatory Visit | Attending: General Surgery | Admitting: General Surgery

## 2018-12-24 VITALS — BP 121/88 | HR 106 | Temp 97.7°F | Resp 15 | Ht 65.0 in | Wt 186.0 lb

## 2018-12-24 DIAGNOSIS — Z3200 Encounter for pregnancy test, result unknown: Secondary | ICD-10-CM | POA: Insufficient documentation

## 2018-12-24 DIAGNOSIS — L0231 Cutaneous abscess of buttock: Secondary | ICD-10-CM | POA: Insufficient documentation

## 2018-12-24 LAB — PREGNANCY, URINE: Preg Test, Ur: NEGATIVE

## 2018-12-24 MED ORDER — IOHEXOL 300 MG/ML  SOLN
100.0000 mL | Freq: Once | INTRAMUSCULAR | Status: AC | PRN
  Administered 2018-12-24: 100 mL via INTRAVENOUS

## 2018-12-24 NOTE — Telephone Encounter (Signed)
Pt called since he took his wife to the specialist and recommend to get a CT x-ray, we was advice to get transfer her x-ray to the [facility] that way we can retro the bill until they apply for CAFA

## 2018-12-24 NOTE — Patient Instructions (Addendum)
Your CT is scheduled for today. You will arrive by 2:45 pm at [place]. You will have nothing to eat or drink after 11 am. You will pick up a prep kit now. We will call you with your results and follow up instructions.

## 2018-12-24 NOTE — Progress Notes (Signed)
Patient has had irregular periods x 3 months.   Per Colletta [place] at [facility], patient needs to have done prior to CT scan today. Patient will be sent to [facility] to have this done.

## 2018-12-24 NOTE — Progress Notes (Signed)
Regina Shea returns to clinic today to reevaluate painful area on her left medial thigh.  I originally saw her at the end of December.  At that time, she was having rectal pain as well as pain in the same area.  I could not appreciate an abscess, fissure, or fistula on exam in the clinic.  She did have a superficial boil and I recommended an additional course of antibiotics, along with warm soaks.  She is back today for reevaluation.  This interview was held with the assistance of a Spanish language interpreter.  Today, she states that she no longer has any rectal pain.  There has been no change in the character of the pain in her left medial thigh.  She says that sometimes, it is so painful that she is unable to walk, stand, or even lie down to get comfortable.  She reports that the area will swell and drain pus.  Then the area of swelling subsides once it has drained.  She says that the skin adjacent to the open wound frequently gets hard and indurated.  She denies any fevers or chills.  No nausea or vomiting.  She would like some relief from the pain and frequent drainage.  Past Medical History:  Diagnosis Date  . Asthma    Past Surgical History:  Procedure Laterality Date  . NO PAST SURGERIES     Family History  Problem Relation Age of Onset  . Hypertension Mother   . Hypertension Father   . Cancer Maternal Uncle    Social History   Tobacco Use  . Smoking status: Never Smoker  . Smokeless tobacco: Never Used  Substance Use Topics  . Alcohol use: No  . Drug use: No   Current Meds  Medication Sig  . acetaminophen (TYLENOL) 325 MG tablet Take 650 mg by mouth every 6 (six) hours as needed.  Marland Kitchen acyclovir (ZOVIRAX) 400 MG tablet Take 400 mg by mouth 5 (five) times daily.  . [DISCONTINUED] SUMAtriptan (IMITREX) 50 MG tablet Take 1 tab PO at start of Headache.  May repeat in 2 hrs x one if HA persists.  Limit to 2 tabs/24  . [DISCONTINUED] traMADol (ULTRAM) 50 MG tablet Take by  mouth every 6 (six) hours as needed.   No Known Allergies  Vitals:   12/24/18 0933  BP: 121/88  Pulse: (!) 106  Resp: 15  Temp: 97.7 F (36.5 C)  SpO2: 99%   Focused exam (chaperone present): There is an indurated area of skin just lateral to the patient's vulva.  No fluctuance is appreciated.  Slightly lateral, there is an area with 3 small skin tear-like openings.  No pus is expressed from this area.  Impression and plan: Regina Shea is a 38 year old woman who has had chronic pain and purulent drainage from an area on her left medial thigh, just lateral to the vulva.  I am unable to appreciate any drainable fluid collections on physical exam today.  Due to the chronic nature of this and her description of the area filling and draining repeatedly, I have ordered a CT scan of the pelvis to evaluate for a cavity or undrained abscess.  I will follow-up the results of this.  I suspect she will require an examination under anesthesia and possible incision and drainage, but at this time, I do not have enough information about where to place incision and whether or not there may still be an ano-rectal component.  Once the results are available, we  will contact Regina Shea and make further plans.

## 2018-12-24 NOTE — H&P (View-Only) (Signed)
Regina Shea returns to clinic today to reevaluate painful area on her left medial thigh.  I originally saw her at the end of December.  At that time, she was having rectal pain as well as pain in the same area.  I could not appreciate an abscess, fissure, or fistula on exam in the clinic.  She did have a superficial boil and I recommended an additional course of antibiotics, along with warm soaks.  She is back today for reevaluation.  This interview was held with the assistance of a Spanish language interpreter.  Today, she states that she no longer has any rectal pain.  There has been no change in the character of the pain in her left medial thigh.  She says that sometimes, it is so painful that she is unable to walk, stand, or even lie down to get comfortable.  She reports that the area will swell and drain pus.  Then the area of swelling subsides once it has drained.  She says that the skin adjacent to the open wound frequently gets hard and indurated.  She denies any fevers or chills.  No nausea or vomiting.  She would like some relief from the pain and frequent drainage.  Past Medical History:  Diagnosis Date  . Asthma    Past Surgical History:  Procedure Laterality Date  . NO PAST SURGERIES     Family History  Problem Relation Age of Onset  . Hypertension Mother   . Hypertension Father   . Cancer Maternal Uncle    Social History   Tobacco Use  . Smoking status: Never Smoker  . Smokeless tobacco: Never Used  Substance Use Topics  . Alcohol use: No  . Drug use: No   Current Meds  Medication Sig  . acetaminophen (TYLENOL) 325 MG tablet Take 650 mg by mouth every 6 (six) hours as needed.  Marland Kitchen acyclovir (ZOVIRAX) 400 MG tablet Take 400 mg by mouth 5 (five) times daily.  . [DISCONTINUED] SUMAtriptan (IMITREX) 50 MG tablet Take 1 tab PO at start of Headache.  May repeat in 2 hrs x one if HA persists.  Limit to 2 tabs/24  . [DISCONTINUED] traMADol (ULTRAM) 50 MG tablet Take by  mouth every 6 (six) hours as needed.   No Known Allergies  Vitals:   12/24/18 0933  BP: 121/88  Pulse: (!) 106  Resp: 15  Temp: 97.7 F (36.5 C)  SpO2: 99%   Focused exam (chaperone present): There is an indurated area of skin just lateral to the patient's vulva.  No fluctuance is appreciated.  Slightly lateral, there is an area with 3 small skin tear-like openings.  No pus is expressed from this area.  Impression and plan: Regina Shea is a 38 year old woman who has had chronic pain and purulent drainage from an area on her left medial thigh, just lateral to the vulva.  I am unable to appreciate any drainable fluid collections on physical exam today.  Due to the chronic nature of this and her description of the area filling and draining repeatedly, I have ordered a CT scan of the pelvis to evaluate for a cavity or undrained abscess.  I will follow-up the results of this.  I suspect she will require an examination under anesthesia and possible incision and drainage, but at this time, I do not have enough information about where to place incision and whether or not there may still be an ano-rectal component.  Once the results are available, we  will contact Regina Shea and make further plans.

## 2019-01-01 ENCOUNTER — Telehealth: Payer: Self-pay | Admitting: General Surgery

## 2019-01-01 NOTE — Telephone Encounter (Signed)
With the assistance of telephone interpreter Kittie Plater [phone]), I contacted [name] with the results of her CT scan.  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  I have recommended that she undergo an EUA and fistulotomy/placement of fibrin glue. She is concerned/scared about general anesthesia.  I told her we could arrange a face to face discussion with the anesthesia providers to help assuage her fears.  I will have my surgery schedulers contact her with a date.

## 2019-01-05 ENCOUNTER — Ambulatory Visit: Payer: Self-pay

## 2019-01-05 ENCOUNTER — Other Ambulatory Visit: Payer: Self-pay

## 2019-01-14 ENCOUNTER — Telehealth: Payer: Self-pay | Admitting: *Deleted

## 2019-01-14 NOTE — Telephone Encounter (Signed)
Patient called you in regards to Regina Shea message, patient needs you to call her and to use an interpreter when you contact patient.

## 2019-01-14 NOTE — Telephone Encounter (Signed)
I did call and speak with the patient's husband, Trudee Grip, today with the help of [place] interpreter, Felix Ahmadi [phone].  Patient's husband would like for [place] to go ahead and get surgery scheduled for 01-23-19 at [facility] with Dr. Celine Ahr.   The patient will need to pre-admit and have COVID testing done prior. I will see if both can be scheduled for 01-20-19.  I will contact patient or her husband once [facility] arranges for further instructions.

## 2019-01-15 NOTE — Telephone Encounter (Signed)
Patient was contacted today through [place] with the help of [place] [phone].  The patient was notified that surgery has been scheduled with Dr. Celine Ahr for 01-23-19 at [facility].   Patient will need to go for Pre-admit appointment and COVID testing on 01-20-19 at 1:15 pm. She is aware to check in at the [facility] entrance.   Patient verbalizes understanding of the above.

## 2019-01-16 ENCOUNTER — Other Ambulatory Visit: Payer: Self-pay | Admitting: General Surgery

## 2019-01-16 DIAGNOSIS — L0231 Cutaneous abscess of buttock: Secondary | ICD-10-CM

## 2019-01-16 DIAGNOSIS — K603 Anal fistula: Secondary | ICD-10-CM

## 2019-01-20 ENCOUNTER — Encounter
Admission: RE | Admit: 2019-01-20 | Discharge: 2019-01-20 | Disposition: A | Discharge status: Home / Self Care | Payer: Self-pay | Source: Ambulatory Visit | Attending: General Surgery | Admitting: General Surgery

## 2019-01-20 ENCOUNTER — Other Ambulatory Visit: Payer: Self-pay

## 2019-01-20 DIAGNOSIS — R079 Chest pain, unspecified: Secondary | ICD-10-CM

## 2019-01-20 DIAGNOSIS — I498 Other specified cardiac arrhythmias: Secondary | ICD-10-CM | POA: Insufficient documentation

## 2019-01-20 DIAGNOSIS — Z1159 Encounter for screening for other viral diseases: Secondary | ICD-10-CM | POA: Insufficient documentation

## 2019-01-20 DIAGNOSIS — K603 Anal fistula: Secondary | ICD-10-CM | POA: Insufficient documentation

## 2019-01-20 DIAGNOSIS — Z01818 Encounter for other preprocedural examination: Secondary | ICD-10-CM | POA: Insufficient documentation

## 2019-01-20 HISTORY — DX: Headache, unspecified: R51.9

## 2019-01-20 HISTORY — DX: Chest pain, unspecified: R07.9

## 2019-01-20 LAB — BASIC METABOLIC PANEL
Anion gap: 7 (ref 5–15)
BUN: 15 mg/dL (ref 6–20)
CO2: 24 mmol/L (ref 22–32)
Calcium: 9.4 mg/dL (ref 8.9–10.3)
Chloride: 107 mmol/L (ref 98–111)
Creatinine, Ser: 0.59 mg/dL (ref 0.44–1.00)
GFR calc Af Amer: >60 mL/min (ref >60)
GFR calc non Af Amer: >60 mL/min (ref >60)
Glucose, Bld: 103 mg/dL — ABNORMAL HIGH (ref 70–99)
Potassium: 3.4 mmol/L — ABNORMAL LOW (ref 3.5–5.1)
Sodium: 138 mmol/L (ref 135–145)

## 2019-01-20 LAB — CBC
HCT: 43.3 % (ref 36.0–46.0)
Hemoglobin: 14.3 g/dL (ref 12.0–15.0)
MCH: 27.9 pg (ref 26.0–34.0)
MCHC: 33 g/dL (ref 30.0–36.0)
MCV: 84.4 fL (ref 80.0–100.0)
Platelets: 344 10*3/uL (ref 150–400)
RBC: 5.13 MIL/uL — ABNORMAL HIGH (ref 3.87–5.11)
RDW: 13.2 % (ref 11.5–15.5)
WBC: 11.6 10*3/uL — ABNORMAL HIGH (ref 4.0–10.5)
nRBC: 0 % (ref 0.0–0.2)

## 2019-01-20 NOTE — Patient Instructions (Signed)
Your procedure is scheduled on: 01-23-19 FRIDAY Su procedimiento est programado para: Report to [place] a: To find out your arrival time please call [phone] between 1PM - 3PM on 01-22-19 THURSDAY Para saber su hora de llegada por favor llame al [phone] entre la 1PM - 3PM el da:   Remember: Instructions that are not followed completely may result in serious medical risk, up to and including death,  or upon the discretion of your surgeon and anesthesiologist your surgery may need to be rescheduled.  Recuerde: Las instrucciones que no se siguen completamente Heritage manager en un riesgo de salud grave, incluyendo hasta  la [place] o a discrecin de su cirujano y Environmental health practitioner, su ciruga se puede posponer.   __X_ 1.Do not eat food after midnight the night before your procedure. No    gum chewing or hard candies. You may drink clear liquids up to 2 hours     before you are scheduled to arrive for your surgery- DO not drink clear     Liquids within 2 hours of the start of your surgery.     Clear Liquids include:    water, apple juice without pulp, clear carbohydrate drink such as    Clearfast of Gartorade, Black Coffee or Tea (Do not add anything to coffee or tea).      No coma nada despus de la medianoche de la noche anterior a su    procedimiento. No coma chicles ni caramelos duros. Puede tomar    lquidos claros hasta 2 horas antes de su hora programada de llegada al     hospital para su procedimiento. No tome lquidos claros durante el     transcurso de las 2 horas de su llegada programada al hospital para su     procedimiento, ya que esto puede llevar a que su procedimiento se    retrase o tenga que volver a Health and safety inspector.  Los lquidos claros incluyen:          - Agua o jugo de [place] sin pulpa          - Bebidas claras con carbohidratos como ClearFast o Gatorade          - Caf negro o t claro (sin leche, sin cremas, no agregue  nada al caf ni al t)  No tome nada que no est en esta lista.  Los pacientes con diabetes tipo 1 y tipo 2 solo deben Agricultural engineer.  Llame a la clnica de PreCare o a la unidad de Same Day Surgery si  tiene alguna pregunta sobre estas instrucciones.              ___ 2.Do Not Smoke or use e-cigarettes For 24 Hours Prior to Your Surgery.    Do not use any chewable tobacco products for at least 6   hours prior to surgery.    No fume ni use cigarrillos electrnicos durante las 24 horas previas    a su [place].  No use ningn producto de tabaco masticable durante   al menos 6 horas antes de la [place].     __X_ 3. No alcohol for 24 hours before or after surgery.    No tome alcohol durante las 24 horas antes ni despus de la [place].   ____4. Bring all medications with you on the day of surgery if instructed.    Lleve todos los medicamentos con usted el da de su ciruga si se le  ha indicado as.   _X___ 5. Notify your doctor if there is any change in your medical condition (cold,fever, infections).    Informe a su mdico si hay algn cambio en su condicin mdica  (resfriado, fiebre, infecciones).   Do not wear jewelry, make-up, hairpins, clips or nail polish.  No use joyas, maquillajes, pinzas/ganchos para el cabello ni esmalte de uas.  Do not wear lotions, powders, or perfumes. You may wear deodorant.  No use lociones, polvos o perfumes.  Puede usar desodorante.    Do not shave 48 hours prior to surgery. Men may shave face and neck.  No se afeite 48 horas antes de la [place].  Los hombres pueden [employer] cara  y el cuello.   Do not bring valuables to the hospital.   No lleve objetos [place] is not responsible for any belongings or valuables.  [place] no se hace responsable de ningn tipo de pertenencias u objetos de Geographical information systems officer.               Contacts, dentures or bridgework may not be worn into surgery.  Los lentes de [place], las dentaduras  postizas o puentes no se pueden usar en la [place].   Leave your suitcase in the car. After surgery it may be brought to your room.  Deje su maleta en el auto.  Despus de la ciruga podr traerla a su habitacin.   For patients admitted to the hospital, discharge time is determined by your  treatment team.  Para los pacientes que sean ingresados al hospital, el tiempo en el cual se le  dar de alta es determinado por su equipo de [place].   Patients discharged the day of surgery will not be allowed to drive home. A los pacientes que se les da de alta el mismo da de la ciruga no se les permitir conducir a Holiday representative.   Please read over the following fact sheets that you were given: Por favor [place] informacin que le dieron:     ____ Take these medicines the morning of surgery with A SIP OF WATER:          [employer] medicinas la maana de la ciruga con UN SORBO DE AGUA:  1.NONE  2.   3.   4.       5.  6.  _X___ Fleet Enema (as directed)-DO FLEET ENEMA AT [place] (Thursday NIGHT) AND ANOTHER FLEET ENEMA AT HOME THE MORNING OF SURGERY (Friday) 1 HOUR PRIOR TO ARRIVAL TIME TO HOSPITAL          Enema de Fleet (segn lo indicado)    ____ Use CHG Soap as directed          Utilice el jabn de CHG segn lo indicado  ____ Use inhalers on the day of surgery          Use los inhaladores el da de la ciruga  ____ Stop metformin 2 days prior to surgery          Deje de tomar el metformin 2 das antes de la ciruga    ____ Take 1/2 of usual insulin dose the night before surgery and none on the morning of surgery           Tome la mitad de la dosis habitual de insulina la noche antes de la [place] y no tome nada en la maana de la  ciruga  ____ Stop Coumadin/Plavix/aspirin on          Deje de tomar el Coumadin/Plavix/aspirina el da:  _X___ Stop Anti-inflammatories NOW-DO NOT TAKE ADVIL, IBUPROFEN, ALEVE, NAPROXEN. OK TO TAKE  TYLENOL OR TRAMADOL IF NEEDED          Deje de tomar antiinflamatorios el da:   ____ Stop supplements until after surgery            Deje de tomar suplementos hasta despus de la ciruga  ____ Bring C-Pap to the hospital          [place] al hospital

## 2019-01-21 ENCOUNTER — Telehealth: Payer: Self-pay | Admitting: *Deleted

## 2019-01-21 ENCOUNTER — Other Ambulatory Visit: Payer: Self-pay | Admitting: *Deleted

## 2019-01-21 MED ORDER — POTASSIUM CHLORIDE CRYS ER 20 MEQ PO TBCR: 20.0000 meq | EXTENDED_RELEASE_TABLET | Freq: Two times a day (BID) | ORAL | 0 refills | Status: DC

## 2019-01-21 MED FILL — POTASSIUM CL ER 20 MEQ TAB: 20 | 5 days supply | Qty: 10 | Fill #0

## 2019-01-21 NOTE — Telephone Encounter (Signed)
I did call [facility] and was able to speak with Geryl Rankins, NP regarding the request for medical clearance on this patient.   [name] reports that she has not seen the request for medical clearance yet. I did let her know that the EKG and labs were available in Epic. She did review while I was on the phone. She states that from a cardiac standpoint that the patient is fine-normal EKG. Labs okay except for potassium and WBC's. She states patient does not need to be seen in their office for medical clearance because the labs and EKG were already completed. She said she would make a note in Epic and route to Dr. Celine Ahr.   Dr. Celine Ahr was notified of the above.

## 2019-01-21 NOTE — Telephone Encounter (Signed)
Heather in the Pre-admission Testing Department called the office this morning stating this patient would need medical clearance prior to surgery. She Pre-admitted yesterday and had a normal EKG but complained of recent chest pain and told staff she couldn't walk a mile with without getting short of breath.   Patient sees Geryl Rankins for her primary care. We will try and obtain clearance prior to surgery scheduled for Friday, 01-23-19. If unable to do so, surgery will need to be rescheduled.   Note routed to Dr. Celine Ahr.

## 2019-01-21 NOTE — Pre-Procedure Instructions (Signed)
Messaged Dr Rosey Bath regarding pt having recent cp and sob who cannot walk a mile without getting sob. Dr Rosey Bath wanting medical clearance-Called Selinda Eon at Dr Stacy Gardner office and informed her of this and also faxed clearance request with EKG and labs to Dr Celine Ahr and to Geryl Rankins NP with fax confirmation confirmed

## 2019-01-21 NOTE — Telephone Encounter (Signed)
I called and spoke with Dorothea Ogle at [facility].   He will send a message for someone to call our office back. We need to see if patient will require an office visit for medical clearance since she was just seen in January or if she can be cleared off of last visit.

## 2019-01-21 NOTE — Telephone Encounter (Signed)
Patient contacted today with the assistance of [place] interpreter [phone], [place].   Patient notified of the need for medical clearance and that potassium was low on recent labs. We originally sent in a prescription for potassium chloride 20 meq BID x 5 days #10 no refills to [facility] but patient is requesting this be sent in to [facility] on [address] in [place]. This will be changed accordingly.   Patient is aware that we speak with Geryl Rankins, NP about medical clearance request.   The patient is also aware the hospital will recheck potassium the morning of procedure.

## 2019-01-21 NOTE — Telephone Encounter (Signed)
OK. Thanks.

## 2019-01-21 NOTE — Addendum Note (Signed)
Addended by: Dominga Ferry on: 01/21/2019 04:42 PM   Modules accepted: Orders

## 2019-01-22 ENCOUNTER — Encounter: Payer: Self-pay | Admitting: Anesthesiology

## 2019-01-22 LAB — NOVEL CORONAVIRUS, NAA (HOSP ORDER, SEND-OUT TO REF LAB; TAT 18-24 HRS): SARS-CoV-2, NAA: NOT DETECTED

## 2019-01-22 NOTE — Pre-Procedure Instructions (Signed)
Medical clearance received from Geryl Rankins NP

## 2019-01-23 ENCOUNTER — Ambulatory Visit: Payer: Self-pay | Admitting: Anesthesiology

## 2019-01-23 ENCOUNTER — Other Ambulatory Visit: Payer: Self-pay

## 2019-01-23 ENCOUNTER — Encounter
Admission: RE | Disposition: A | Discharge status: Home / Self Care | Payer: Self-pay | Source: Home / Self Care | Attending: General Surgery

## 2019-01-23 ENCOUNTER — Ambulatory Visit
Admission: RE | Admit: 2019-01-23 | Discharge: 2019-01-23 | Disposition: A | Discharge status: Home / Self Care | Payer: Self-pay | Attending: General Surgery | Admitting: General Surgery

## 2019-01-23 ENCOUNTER — Encounter: Payer: Self-pay | Admitting: *Deleted

## 2019-01-23 DIAGNOSIS — E669 Obesity, unspecified: Secondary | ICD-10-CM | POA: Insufficient documentation

## 2019-01-23 DIAGNOSIS — Z79899 Other long term (current) drug therapy: Secondary | ICD-10-CM | POA: Insufficient documentation

## 2019-01-23 DIAGNOSIS — J45909 Unspecified asthma, uncomplicated: Secondary | ICD-10-CM | POA: Insufficient documentation

## 2019-01-23 DIAGNOSIS — K603 Anal fistula, unspecified: Secondary | ICD-10-CM

## 2019-01-23 DIAGNOSIS — G8929 Other chronic pain: Secondary | ICD-10-CM | POA: Insufficient documentation

## 2019-01-23 DIAGNOSIS — Z809 Family history of malignant neoplasm, unspecified: Secondary | ICD-10-CM | POA: Insufficient documentation

## 2019-01-23 DIAGNOSIS — G43909 Migraine, unspecified, not intractable, without status migrainosus: Secondary | ICD-10-CM | POA: Insufficient documentation

## 2019-01-23 DIAGNOSIS — R079 Chest pain, unspecified: Secondary | ICD-10-CM | POA: Insufficient documentation

## 2019-01-23 DIAGNOSIS — Z6830 Body mass index (BMI) 30.0-30.9, adult: Secondary | ICD-10-CM | POA: Insufficient documentation

## 2019-01-23 DIAGNOSIS — Z8249 Family history of ischemic heart disease and other diseases of the circulatory system: Secondary | ICD-10-CM | POA: Insufficient documentation

## 2019-01-23 HISTORY — PX: RECTAL EXAM UNDER ANESTHESIA: SHX6399

## 2019-01-23 LAB — POCT I-STAT 4, (NA,K, GLUC, HGB,HCT)
Glucose, Bld: 103 mg/dL — ABNORMAL HIGH (ref 70–99)
HCT: 45 % (ref 36.0–46.0)
Hemoglobin: 15.3 g/dL — ABNORMAL HIGH (ref 12.0–15.0)
Potassium: 3.7 mmol/L (ref 3.5–5.1)
Sodium: 142 mmol/L (ref 135–145)

## 2019-01-23 LAB — POCT PREGNANCY, URINE: Preg Test, Ur: NEGATIVE

## 2019-01-23 SURGERY — EXAM UNDER ANESTHESIA, RECTUM
Anesthesia: General

## 2019-01-23 MED ORDER — ROCURONIUM BROMIDE 100 MG/10ML IV SOLN
INTRAVENOUS | Status: DC | PRN
  Administered 2019-01-23: 30 mg via INTRAVENOUS
  Administered 2019-01-23: 5 mg via INTRAVENOUS

## 2019-01-23 MED ORDER — MEPERIDINE HCL 50 MG/ML IJ SOLN: 6.2500 mg | INTRAMUSCULAR | Status: DC | PRN

## 2019-01-23 MED ORDER — CELECOXIB 200 MG PO CAPS
ORAL_CAPSULE | ORAL | Status: AC
  Administered 2019-01-23: 12:00:00 200 mg via ORAL
  Filled 2019-01-23: qty 1

## 2019-01-23 MED ORDER — FLEET ENEMA 7-19 GM/118ML RE ENEM: 1.0000 | ENEMA | Freq: Once | RECTAL | Status: DC

## 2019-01-23 MED ORDER — PROMETHAZINE HCL 25 MG/ML IJ SOLN: 6.2500 mg | INTRAMUSCULAR | Status: DC | PRN

## 2019-01-23 MED ORDER — PROPOFOL 10 MG/ML IV BOLUS
INTRAVENOUS | Status: DC | PRN
  Administered 2019-01-23: 170 mg via INTRAVENOUS

## 2019-01-23 MED ORDER — DEXAMETHASONE SODIUM PHOSPHATE 10 MG/ML IJ SOLN
INTRAMUSCULAR | Status: DC | PRN
  Administered 2019-01-23: 10 mg via INTRAVENOUS

## 2019-01-23 MED ORDER — CHLORHEXIDINE GLUCONATE CLOTH 2 % EX PADS: 6.0000 | MEDICATED_PAD | Freq: Once | CUTANEOUS | Status: DC

## 2019-01-23 MED ORDER — CELECOXIB 200 MG PO CAPS
200.0000 mg | ORAL_CAPSULE | ORAL | Status: AC
  Administered 2019-01-23: 12:00:00 200 mg via ORAL

## 2019-01-23 MED ORDER — OXYCODONE HCL 5 MG PO TABS: 5.0000 mg | ORAL_TABLET | Freq: Four times a day (QID) | ORAL | 0 refills | Status: DC | PRN

## 2019-01-23 MED ORDER — ONDANSETRON HCL 4 MG/2ML IJ SOLN
INTRAMUSCULAR | Status: AC
  Filled 2019-01-23: qty 2

## 2019-01-23 MED ORDER — KETOROLAC TROMETHAMINE 30 MG/ML IJ SOLN
INTRAMUSCULAR | Status: DC | PRN
  Administered 2019-01-23: 30 mg via INTRAVENOUS

## 2019-01-23 MED ORDER — ROCURONIUM BROMIDE 50 MG/5ML IV SOLN
INTRAVENOUS | Status: AC
  Filled 2019-01-23: qty 1

## 2019-01-23 MED ORDER — OXYCODONE HCL 5 MG/5ML PO SOLN: 5.0000 mg | Freq: Once | ORAL | Status: DC | PRN

## 2019-01-23 MED ORDER — FENTANYL CITRATE (PF) 100 MCG/2ML IJ SOLN
INTRAMUSCULAR | Status: AC
  Filled 2019-01-23: qty 2

## 2019-01-23 MED ORDER — KETOROLAC TROMETHAMINE 30 MG/ML IJ SOLN
INTRAMUSCULAR | Status: AC
  Filled 2019-01-23: qty 1

## 2019-01-23 MED ORDER — ACETAMINOPHEN 500 MG PO TABS
1000.0000 mg | ORAL_TABLET | ORAL | Status: AC
  Administered 2019-01-23: 12:00:00 1000 mg via ORAL

## 2019-01-23 MED ORDER — FENTANYL CITRATE (PF) 100 MCG/2ML IJ SOLN
INTRAMUSCULAR | Status: DC | PRN
  Administered 2019-01-23: 50 ug via INTRAVENOUS
  Administered 2019-01-23: 25 ug via INTRAVENOUS
  Administered 2019-01-23: 50 ug via INTRAVENOUS

## 2019-01-23 MED ORDER — LIDOCAINE HCL (PF) 2 % IJ SOLN
INTRAMUSCULAR | Status: AC
  Filled 2019-01-23: qty 10

## 2019-01-23 MED ORDER — FAMOTIDINE 20 MG PO TABS
20.0000 mg | ORAL_TABLET | Freq: Once | ORAL | Status: AC
  Administered 2019-01-23: 20 mg via ORAL

## 2019-01-23 MED ORDER — OXYCODONE HCL 5 MG PO TABS: 5.0000 mg | ORAL_TABLET | Freq: Once | ORAL | Status: DC | PRN

## 2019-01-23 MED ORDER — IBUPROFEN 800 MG PO TABS: 800.0000 mg | ORAL_TABLET | Freq: Three times a day (TID) | ORAL | 0 refills | Status: DC | PRN

## 2019-01-23 MED ORDER — EVICEL 2 ML EX KIT
PACK | CUTANEOUS | Status: DC | PRN
  Administered 2019-01-23: 1

## 2019-01-23 MED ORDER — BUPIVACAINE HCL (PF) 0.25 % IJ SOLN
INTRAMUSCULAR | Status: DC | PRN
  Administered 2019-01-23: 20 mL

## 2019-01-23 MED ORDER — LACTATED RINGERS IV SOLN
INTRAVENOUS | Status: DC
  Administered 2019-01-23: 13:00:00 via INTRAVENOUS

## 2019-01-23 MED ORDER — GELATIN ABSORBABLE 100 CM EX MISC
CUTANEOUS | Status: AC
  Filled 2019-01-23: qty 1

## 2019-01-23 MED ORDER — BUPIVACAINE HCL (PF) 0.25 % IJ SOLN
INTRAMUSCULAR | Status: AC
  Filled 2019-01-23: qty 30

## 2019-01-23 MED ORDER — DEXAMETHASONE SODIUM PHOSPHATE 10 MG/ML IJ SOLN
INTRAMUSCULAR | Status: AC
  Filled 2019-01-23: qty 1

## 2019-01-23 MED ORDER — PROPOFOL 10 MG/ML IV BOLUS
INTRAVENOUS | Status: AC
  Filled 2019-01-23: qty 20

## 2019-01-23 MED ORDER — ACETAMINOPHEN 500 MG PO TABS
ORAL_TABLET | ORAL | Status: AC
  Administered 2019-01-23: 1000 mg via ORAL
  Filled 2019-01-23: qty 2

## 2019-01-23 MED ORDER — MIDAZOLAM HCL 2 MG/2ML IJ SOLN
INTRAMUSCULAR | Status: AC
  Filled 2019-01-23: qty 2

## 2019-01-23 MED ORDER — MIDAZOLAM HCL 2 MG/2ML IJ SOLN
INTRAMUSCULAR | Status: DC | PRN
  Administered 2019-01-23: 2 mg via INTRAVENOUS

## 2019-01-23 MED ORDER — BUPIVACAINE LIPOSOME 1.3 % IJ SUSP
INTRAMUSCULAR | Status: AC
  Filled 2019-01-23: qty 20

## 2019-01-23 MED ORDER — ONDANSETRON HCL 4 MG/2ML IJ SOLN
INTRAMUSCULAR | Status: DC | PRN
  Administered 2019-01-23: 4 mg via INTRAVENOUS

## 2019-01-23 MED ORDER — SUGAMMADEX SODIUM 200 MG/2ML IV SOLN
INTRAVENOUS | Status: DC | PRN
  Administered 2019-01-23: 160 mg via INTRAVENOUS

## 2019-01-23 MED ORDER — FENTANYL CITRATE (PF) 100 MCG/2ML IJ SOLN: 25.0000 ug | INTRAMUSCULAR | Status: DC | PRN

## 2019-01-23 MED ORDER — LIDOCAINE HCL (CARDIAC) PF 100 MG/5ML IV SOSY
PREFILLED_SYRINGE | INTRAVENOUS | Status: DC | PRN
  Administered 2019-01-23: 80 mg via INTRAVENOUS

## 2019-01-23 MED ORDER — SUGAMMADEX SODIUM 200 MG/2ML IV SOLN
INTRAVENOUS | Status: AC
  Filled 2019-01-23: qty 2

## 2019-01-23 MED ORDER — FAMOTIDINE 20 MG PO TABS
ORAL_TABLET | ORAL | Status: AC
  Administered 2019-01-23: 12:00:00 20 mg via ORAL
  Filled 2019-01-23: qty 1

## 2019-01-23 MED ORDER — THROMBIN 5000 UNITS EX SOLR
CUTANEOUS | Status: AC
  Filled 2019-01-23: qty 5000

## 2019-01-23 SURGICAL SUPPLY — 30 items
BLADE SURG 15 STRL LF DISP TIS (BLADE) IMPLANT
BLADE SURG 15 STRL SS (BLADE) ×1
CANISTER SUCT 1200ML W/VALVE (MISCELLANEOUS) ×2 IMPLANT
COVER WAND RF STERILE (DRAPES) ×2 IMPLANT
DRAPE LAPAROTOMY 100X77 ABD (DRAPES) ×2 IMPLANT
DRAPE LEGGINS SURG 28X43 STRL (DRAPES) ×2 IMPLANT
DRAPE UNDER BUTTOCK W/FLU (DRAPES) ×2 IMPLANT
DRSG GAUZE FLUFF 36X18 (GAUZE/BANDAGES/DRESSINGS) ×2 IMPLANT
ELECT CAUTERY BLADE TIP 2.5 (TIP) ×2
ELECT REM PT RETURN 9FT ADLT (ELECTROSURGICAL) ×2
ELECTRODE CAUTERY BLDE TIP 2.5 (TIP) ×1 IMPLANT
ELECTRODE REM PT RTRN 9FT ADLT (ELECTROSURGICAL) ×1 IMPLANT
EVICEL AIRLESS SPRAY ACCES (MISCELLANEOUS) ×1 IMPLANT
GLOVE BIO SURGEON STRL SZ 6.5 (GLOVE) ×2 IMPLANT
GLOVE INDICATOR 7.0 STRL GRN (GLOVE) ×2 IMPLANT
GOWN STRL REUS W/ TWL LRG LVL3 (GOWN DISPOSABLE) ×2 IMPLANT
GOWN STRL REUS W/TWL LRG LVL3 (GOWN DISPOSABLE) ×2
KIT TURNOVER CYSTO (KITS) ×2 IMPLANT
LABEL OR SOLS (LABEL) ×2 IMPLANT
NEEDLE HYPO 22GX1.5 SAFETY (NEEDLE) ×2 IMPLANT
PACK BASIN MINOR ARMC (MISCELLANEOUS) ×2 IMPLANT
PAD PREP 24X41 OB/GYN DISP (PERSONAL CARE ITEMS) ×2 IMPLANT
SOL PREP PVP 2OZ (MISCELLANEOUS) ×2
SOLUTION PREP PVP 2OZ (MISCELLANEOUS) ×1 IMPLANT
SPONGE LAP 18X18 RF (DISPOSABLE) ×2 IMPLANT
SURGILUBE 2OZ TUBE FLIPTOP (MISCELLANEOUS) ×2 IMPLANT
SUT SILK 0 CT 1 30 (SUTURE) ×2 IMPLANT
SUT VIC AB 3-0 SH 27 (SUTURE)
SUT VIC AB 3-0 SH 27X BRD (SUTURE) IMPLANT
SYR 20CC LL (SYRINGE) ×2 IMPLANT

## 2019-01-23 NOTE — Anesthesia Postprocedure Evaluation (Signed)
Anesthesia Post Note  Patient: Sharolyn Sanchez-Herrera  Procedure(s) Performed: RECTAL EXAM UNDER ANESTHESIA, with FISTULOTOMY and  FIBRIN GLUE (N/A )  Patient location during evaluation: PACU Anesthesia Type: General Level of consciousness: awake and alert and oriented Pain management: pain level controlled Vital Signs Assessment: post-procedure vital signs reviewed and stable Respiratory status: spontaneous breathing, nonlabored ventilation and respiratory function stable Cardiovascular status: blood pressure returned to baseline and stable Postop Assessment: no signs of nausea or vomiting Anesthetic complications: no     Last Vitals:  Vitals:   01/23/19 1454 01/23/19 1509  BP: 114/72 105/65  Pulse: 88 82  Resp: 15 15  Temp:  (!) 36.4 C  SpO2: 99% 99%    Last Pain:  Vitals:   01/23/19 1509  TempSrc:   PainSc: 3                  Paizlie Klaus

## 2019-01-23 NOTE — Op Note (Signed)
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  Surgeon: Fredirick Maudlin, MD  Assistant: None  Anesthesia: General endotracheal  Findings: There was a subcutaneous area of prior abscess that was unroofed and probed which led to a fistula with in the anterior aspect of the anal canal.  No frank purulent material be expressed.  Due to the anterior location, I was concerned that this may have resulted from her multiple prior perineal tears during childbirth.  As result, I did not feel comfortable opening the entire fistula along its length.  As result, I elected to curette the tract and apply fibrin glue.  Indications: [name] is a 38 year old woman who has had complaints of perianal pain and purulent drainage from a subcutaneous abscess on her buttock.  Examinations in clinic were unrevealing, as the abscess had typically drained prior to each of her visits.  A CT scan was performed that demonstrated a fistulous tract between the subcu area and the anus.  I recommended that she undergo an examination under anesthesia, with treatment to be determined during the course of the case.  The risks of the procedure, including the high risk of recurrence were discussed with her and she agreed to proceed.  Procedure In Detail: The patient was identified in the preoperative holding area, where all of her questions were answered.  She was then brought to the operating room and intubated on her stretcher.  She was then turned into the prone position.  Care was taken to pad all bony prominences appropriately and support her extremities.  The hip was then flexed into prone jackknife position.  The buttocks were taped laterally to provide exposure.  The area was prepped and draped in standard fashion.  A timeout was performed confirming the patient's identity, the procedure  being performed, her allergies, all necessary equipment was available, and that maintenance anesthesia was adequate.  A rectal exam was performed.  There were no masses or fluctuance appreciated.  A Hill-Ferguson retractor was then introduced into the anal canal.  No obvious fistula opening could be appreciated.  The subcutaneous area of prior abscess was then opened sharply and unroofed.  There was no purulent drainage present at this time.  It was carefully probed and a tract was identified running to the anterior aspect of the anal canal.  As the patient had given a prior history of multiple perineal tears during childbirth, I was concerned that perhaps the fistula was a result of this.  I did not want to open up a rectovaginal fistula and therefore I only partially opened the fistula tract both internally and externally.  A Kevorkian curette was then introduced into the tract.  The tract was curetted and fibrin glue was slowly injected.  Gauze fluffs and mesh underpants were applied.  The patient was then turned supine, awakened, extubated, and taken to the postanesthesia care unit in good condition.   EBL: Less than 5 cc  IVF: See anesthesia record  Specimen(s): None  Complications: none immediately apparent.   Counts: all needles, instruments, and sponges were counted and reported to be correct in number at the end of the case.   I was present for and participated in the entire operation.  Fredirick Maudlin  2:32 PM

## 2019-01-23 NOTE — Anesthesia Post-op Follow-up Note (Signed)
Anesthesia QCDR form completed.        

## 2019-01-23 NOTE — Progress Notes (Signed)
Discharge teaching done with assistance of interpretor

## 2019-01-23 NOTE — Interval H&P Note (Signed)
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  The various methods of treatment have been discussed with the patient and family. After consideration of risks, benefits and other options for treatment, the patient has consented to  Procedure(s): RECTAL EXAM UNDER ANESTHESIA, POSSIBLE FISTULOTOMY, POSSIBLE FIBRIN GLUE (N/A) as a surgical intervention.  The patient's history has been reviewed, patient examined, no change in status, stable for surgery.  I have reviewed the patient's chart and labs.  Questions were answered to the patient's satisfaction.     Update of exam:  Vitals:   01/23/19 1245  BP: 135/78  Pulse: 90  Resp: 20  Temp: 99 F (37.2 C)  SpO2: 100%   NAD RRR Normal WOB on RA  Potassium WNL today.  Fredirick Maudlin

## 2019-01-23 NOTE — Anesthesia Preprocedure Evaluation (Signed)
Anesthesia Evaluation  Patient identified by MRN, date of birth, ID band Patient awake    Reviewed: Allergy & Precautions, NPO status , Patient's Chart, lab work & pertinent test results  History of Anesthesia Complications Negative for: history of anesthetic complications  Airway Mallampati: II  TM Distance: >3 FB Neck ROM: Full    Dental  (+) Poor Dentition   Pulmonary asthma , neg sleep apnea, neg COPD,    breath sounds clear to auscultation- rhonchi (-) wheezing      Cardiovascular Exercise Tolerance: Good (-) hypertension(-) CAD and (-) Past MI  Rhythm:Regular Rate:Normal - Systolic murmurs and - Diastolic murmurs    Neuro/Psych  Headaches, negative psych ROS   GI/Hepatic negative GI ROS, Neg liver ROS,   Endo/Other  negative endocrine ROSneg diabetes  Renal/GU negative Renal ROS     Musculoskeletal negative musculoskeletal ROS (+)   Abdominal (+) + obese,   Peds  Hematology negative hematology ROS (+)   Anesthesia Other Findings Past Medical History: No date: Asthma     Comment:  h/o no inhalers 01/20/2019: Chest pain No date: Headache     Comment:  migraines   Reproductive/Obstetrics                             Anesthesia Physical Anesthesia Plan  ASA: II  Anesthesia Plan: General   Post-op Pain Management:    Induction: Intravenous  PONV Risk Score and Plan: 2 and Ondansetron, Dexamethasone and Midazolam  Airway Management Planned: Oral ETT  Additional Equipment:   Intra-op Plan:   Post-operative Plan: Extubation in OR  Informed Consent: I have reviewed the patients History and Physical, chart, labs and discussed the procedure including the risks, benefits and alternatives for the proposed anesthesia with the patient or authorized representative who has indicated his/her understanding and acceptance.     Dental advisory given  Plan Discussed with: CRNA and  Anesthesiologist  Anesthesia Plan Comments:         Anesthesia Quick Evaluation

## 2019-01-23 NOTE — Discharge Instructions (Signed)
Fistulotoma anal, cuidados posteriores Anal Fistulotomy, Care After Lea esta informacin sobre cmo cuidarse despus del procedimiento. Su mdico tambin podr darle indicaciones ms especficas. Comunquese con su mdico si tiene problemas o preguntas. Qu puedo esperar despus del procedimiento? Despus del procedimiento, es comn [employer] siguientes sntomas:  Un poco de dolor, molestias e hinchazn.  Aumento del dolor al [employer].  Un poco de sangrado que proviene de la herida. Siga estas indicaciones en su casa: Medicamentos  [employer] de venta libre y los recetados solamente como se lo haya indicado el mdico.  Si le recetaron un antibitico, tmelo como se lo haya indicado el mdico. No deje de tomar los antibiticos aunque comience a Sports administrator. Actividad  No conduzca durante 24horas si recibi un medicamento como ayuda para relajarse (sedante) durante el procedimiento.  No conduzca ni use maquinaria pesada mientras toma analgsicos recetados.  No levante ningn objeto que pese ms del lmite de peso que le indique su mdico hasta que l lo autorice.  Haga reposo como se lo haya indicado el mdico. Tambin es importante moverse un poco entre perodos de reposo. Levntese y trate de moverse con tanta frecuencia como le sea posible.  Retome sus actividades normales como se lo haya indicado el mdico. Pregntele al mdico qu actividades son seguras para usted. Estilo de vida  Limite el consumo de alcohol a no ms de 1 medida por da si es mujer y no est Music therapist, y a 2 medidas por da si es hombre. Una medida equivale a 12oz (335ml) de cerveza, 5oz (134ml) de vino o 1oz (68ml) de bebidas alcohlicas de alta graduacin.  No consuma ningn producto que contenga nicotina o tabaco, como cigarrillos y Psychologist, sport and exercise. Si necesita ayuda para dejar de fumar, consulte al [employer]. Cuidados de la incisin   Siga las indicaciones del mdico acerca  del cuidado de la incisin. ? Si le colocaron una gasa (vendaje) sobre la incisin durante la ciruga, le indicarn que usted mismo se la retire despus de cierto perodo de [place]. Lvese las manos con agua y [place] antes y despus de cada cambio de vendaje. Use desinfectante para manos si no dispone de [place] y [place]. ? No retire el vendaje, a menos que el mdico se lo indique. Es posible que le indiquen que deje caer el vendaje la primera vez que defeque despus de la ciruga, en lugar de retirarlo usted mismo. ? No retire los puntos (suturas), la goma para cerrar la piel o las tiras [place]. Es posible que estos cierres cutneos deban quedar puestos en la piel durante 2semanas o ms tiempo. No retire las tiras [facility] por completo a menos que el mdico se lo indique.  Mantenga la incisin limpia y [place].  [place] zona de la incisin para detectar signos de infeccin. Est atento a los siguientes signos: ? Aumento del enrojecimiento, de la hinchazn o del dolor. ? Ms lquido Delorise Shiner. ? Calor. ? Pus o mal olor.  Despus de defecar, limpie el rea de la incisin utilizando alguno de los siguientes mtodos: ? Limpie suavemente con una toallita hmeda. ? Limpie suavemente con agua y [place]. ? [place]chese. ? Tome baos de [place]. Se trata de un bao de agua tibia que se toma mientras se est sentado.  Puede aplicar hielo en la zona de la incisin para reducir Health and safety inspector: ? Ponga el hielo en una bolsa plstica. ? Coloque una [employer] piel y la bolsa de hielo. ?  Coloque el hielo durante 31minutos, 2 o 3veces por Training and development officer. Comida y bebida  Siga las indicaciones del mdico respecto de las restricciones para las comidas o las bebidas.  Beba suficiente lquido para Consulting civil engineer orina clara o de color amarillo plido.  Coma alimentos que contengan [employer]. La fibra ayuda a Engineer, materials las deposiciones y Contractor. Consuma alimentos que contengan gran  cantidad de [place], por ejemplo: ? Lambert Mody. ? Verduras. ? Cereales integrales. Instrucciones generales  No practique natacin ni use el jacuzzi hasta que el mdico la autorice.  Si tiene sangrado que proviene de la herida, use un apsito absorbente y cmbielo con regularidad.  Use las medias de compresin como se lo haya indicado el mdico. Estas medias ayudan a Mining engineer formacin de cogulos sanguneos y a Clinical cytogeneticist hinchazn de las piernas.  Concurra a todas las visitas de control como se lo haya indicado el mdico. Esto es importante. Comunquese con un mdico si:  Le aumenta el enrojecimiento, la hinchazn o el dolor alrededor de la zona de la incisin.  La zona de la incisin est caliente al tacto o firme.  Tiene pus o percibe que sale mal olor de la zona de la incisin.  Tiene fiebre o siente escalofros.  Tiene inflamacin o dolor a la palpacin en la zona de la ingle.  No puede controlar la evacuacin de la materia fecal.  Tiene prdida de heces (incontinencia).  Tiene problemas para orinar.  El dolor no mejora con medicamentos. Solicite ayuda de inmediato si:  Siente un dolor intenso en: ? La zona de la herida. ? El abdomen.  Siente sbitamente un dolor en el pecho.  Se siente dbil o se desvanece (se desmaya).  Le sale ms lquido o sangre de la incisin.  Tiene sangrado en el lugar de la incisin que empapa 2o ms apsitos durante 24horas. Esta informacin no tiene Marine scientist el consejo del mdico. Asegrese de hacerle al mdico cualquier pregunta que tenga. Document Released: 10/12/2016 Document Revised: 10/12/2016 Document Reviewed: 11/08/2015 Elsevier Interactive Patient Education  2019 [place] anal, cuidados posteriores Anal Fistulotomy, Care After Lea esta informacin sobre cmo cuidarse despus del procedimiento. Su mdico tambin podr darle indicaciones ms especficas. Comunquese con su mdico si tiene  problemas o preguntas. Qu puedo esperar despus del procedimiento? Despus del procedimiento, es comn [employer] siguientes sntomas:  Un poco de dolor, molestias e hinchazn.  Aumento del dolor al [employer].  Un poco de sangrado que proviene de la herida. Siga estas indicaciones en su casa: Medicamentos  [employer] de venta libre y los recetados solamente como se lo haya indicado el mdico.  Si le recetaron un antibitico, tmelo como se lo haya indicado el mdico. No deje de tomar los antibiticos aunque comience a Sports administrator. Actividad  No conduzca durante 24horas si recibi un medicamento como ayuda para relajarse (sedante) durante el procedimiento.  No conduzca ni use maquinaria pesada mientras toma analgsicos recetados.  No levante ningn objeto que pese ms del lmite de peso que le indique su mdico hasta que l lo autorice.  Haga reposo como se lo haya indicado el mdico. Tambin es importante moverse un poco entre perodos de reposo. Levntese y trate de moverse con tanta frecuencia como le sea posible.  Retome sus actividades normales como se lo haya indicado el mdico. Pregntele al mdico qu actividades son seguras para usted. Estilo de vida  Limite el consumo de alcohol a no ms  de 1 medida por da si es mujer y no est Music therapist, y a 2 medidas por da si es hombre. Una medida equivale a 12oz (375ml) de cerveza, 5oz (138ml) de vino o 1oz (57ml) de bebidas alcohlicas de alta graduacin.  No consuma ningn producto que contenga nicotina o tabaco, como cigarrillos y Psychologist, sport and exercise. Si necesita ayuda para dejar de fumar, consulte al [employer]. Cuidados de la incisin   Siga las indicaciones del mdico acerca del cuidado de la incisin. ? Si le colocaron una gasa (vendaje) sobre la incisin durante la ciruga, le indicarn que usted mismo se la retire despus de cierto perodo de [place]. Lvese las manos con agua y [place] antes y despus de  cada cambio de vendaje. Use desinfectante para manos si no dispone de [place] y [place]. ? No retire el vendaje, a menos que el mdico se lo indique. Es posible que le indiquen que deje caer el vendaje la primera vez que defeque despus de la ciruga, en lugar de retirarlo usted mismo. ? No retire los puntos (suturas), la goma para cerrar la piel o las tiras [place]. Es posible que estos cierres cutneos deban quedar puestos en la piel durante 2semanas o ms tiempo. No retire las tiras [facility] por completo a menos que el mdico se lo indique.  Mantenga la incisin limpia y [place].  [place] zona de la incisin para detectar signos de infeccin. Est atento a los siguientes signos: ? Aumento del enrojecimiento, de la hinchazn o del dolor. ? Ms lquido Delorise Shiner. ? Calor. ? Pus o mal olor.  Despus de defecar, limpie el rea de la incisin utilizando alguno de los siguientes mtodos: ? Limpie suavemente con una toallita hmeda. ? Limpie suavemente con agua y [place]. ? Dchese. ? Tome baos de [place]. Se trata de un bao de agua tibia que se toma mientras se est sentado.  Puede aplicar hielo en la zona de la incisin para reducir Health and safety inspector: ? Ponga el hielo en una bolsa plstica. ? Coloque una [employer] piel y la bolsa de hielo. ? Coloque el hielo durante 73minutos, 2 o 3veces por da. Comida y bebida  Siga las indicaciones del mdico respecto de las restricciones para las comidas o las bebidas.  Beba suficiente lquido para Consulting civil engineer orina clara o de color amarillo plido.  Coma alimentos que contengan [employer]. La fibra ayuda a Engineer, materials las deposiciones y Contractor. Consuma alimentos que contengan gran cantidad de [place], por ejemplo: ? Lambert Mody. ? Verduras. ? Cereales integrales. Instrucciones generales  No practique natacin ni use el jacuzzi hasta que el mdico la autorice.  Si tiene sangrado que proviene de la herida, use un  apsito absorbente y cmbielo con regularidad.  Use las medias de compresin como se lo haya indicado el mdico. Estas medias ayudan a Mining engineer formacin de cogulos sanguneos y a Clinical cytogeneticist hinchazn de las piernas.  Concurra a todas las visitas de control como se lo haya indicado el mdico. Esto es importante. Comunquese con un mdico si:  Le aumenta el enrojecimiento, la hinchazn o el dolor alrededor de la zona de la incisin.  La zona de la incisin est caliente al tacto o firme.  Tiene pus o percibe que sale mal olor de la zona de la incisin.  Tiene fiebre o siente escalofros.  Tiene inflamacin o dolor a la palpacin en la zona de la ingle.  No puede controlar la evacuacin de la  materia fecal.  Tiene prdida de heces (incontinencia).  Tiene problemas para orinar.  El dolor no mejora con medicamentos. Solicite ayuda de inmediato si:  Siente un dolor intenso en: ? La zona de la herida. ? El abdomen.  Siente sbitamente un dolor en el pecho.  Se siente dbil o se desvanece (se desmaya).  Le sale ms lquido o sangre de la incisin.  Tiene sangrado en el lugar de la incisin que empapa 2o ms apsitos durante 24horas. Esta informacin no tiene Marine scientist el consejo del mdico. Asegrese de hacerle al mdico cualquier pregunta que tenga. Document Released: 10/12/2016 Document Revised: 10/12/2016 Document Reviewed: 11/08/2015 Elsevier Interactive Patient Education  2019 [place]   1) The drugs that you were given will stay in your system until tomorrow so for the next 24 hours you should not:  A) Drive an automobile B) Make any legal decisions C) Drink any alcoholic beverage   2) You may resume regular meals tomorrow.  Today it is better to start with liquids and gradually work up to solid foods.  You may eat anything you prefer, but it is better to start with liquids, then soup and crackers,  and gradually work up to solid foods.   3) Please notify your doctor immediately if you have any unusual bleeding, trouble breathing, redness and pain at the surgery site, drainage, fever, or pain not relieved by medication.    4) Additional Instructions:        Please contact your physician with any problems or Same Day Surgery at [phone], Monday through Friday 6 am to 4 pm, or  at [facility] number at [phone].

## 2019-01-23 NOTE — Transfer of Care (Signed)
Immediate Anesthesia Transfer of Care Note  Patient: Regina Shea  Procedure(s) Performed: RECTAL EXAM UNDER ANESTHESIA, with FISTULOTOMY and  FIBRIN GLUE (N/A )  Patient Location: PACU  Anesthesia Type:General  Level of Consciousness: awake and patient cooperative  Airway & Oxygen Therapy: Patient Spontanous Breathing and Patient connected to face mask oxygen  Post-op Assessment: Report given to RN and Post -op Vital signs reviewed and stable  Post vital signs: Reviewed and stable  Last Vitals:  Vitals Value Taken Time  BP 123/74 01/23/19 1424  Temp 36.3 C 01/23/19 1424  Pulse 93 01/23/19 1429  Resp 17 01/23/19 1429  SpO2 100 % 01/23/19 1429  Vitals shown include unvalidated device data.  Last Pain:  Vitals:   01/23/19 1424  TempSrc:   PainSc: Asleep      Patients Stated Pain Goal: 0 (98/26/41 5830)  Complications: No apparent anesthesia complications

## 2019-01-23 NOTE — Anesthesia Procedure Notes (Signed)
Procedure Name: Intubation Date/Time: 01/23/2019 11:23 AM Performed by: Rudean Hitt, CRNA Pre-anesthesia Checklist: Patient identified, Patient being monitored, Timeout performed, Emergency Drugs available and Suction available Patient Re-evaluated:Patient Re-evaluated prior to induction Oxygen Delivery Method: Circle system utilized Preoxygenation: Pre-oxygenation with 100% oxygen Induction Type: IV induction Ventilation: Mask ventilation without difficulty Laryngoscope Size: Mac and 3 Grade View: Grade I Tube type: Oral Tube size: 7.0 mm Number of attempts: 1 Airway Equipment and Method: Stylet Placement Confirmation: ETT inserted through vocal cords under direct vision,  positive ETCO2 and breath sounds checked- equal and bilateral Secured at: 21 cm Tube secured with: Tape Dental Injury: Teeth and Oropharynx as per pre-operative assessment

## 2019-01-24 ENCOUNTER — Encounter: Payer: Self-pay | Admitting: General Surgery

## 2019-02-02 ENCOUNTER — Other Ambulatory Visit: Payer: Self-pay

## 2019-02-02 ENCOUNTER — Encounter: Payer: Self-pay | Admitting: General Surgery

## 2019-02-02 ENCOUNTER — Ambulatory Visit (INDEPENDENT_AMBULATORY_CARE_PROVIDER_SITE_OTHER): Payer: Self-pay | Admitting: General Surgery

## 2019-02-02 VITALS — BP 125/85 | HR 91 | Temp 97.9°F | Ht 61.0 in | Wt 184.0 lb

## 2019-02-02 DIAGNOSIS — K603 Anal fistula: Secondary | ICD-10-CM

## 2019-02-02 NOTE — Progress Notes (Signed)
Regina Shea is here today for a postoperative visit.  She is a 38 year old woman who underwent an examination under anesthesia, partial fistulotomy, and application of fibrin glue on 23 January 2019.  I elected not to perform a full fistulotomy during the operation, due to concern for potential vaginal violation.  Today's interview was conducted with the assistance of a Spanish language interpreter.  Regina Shea states that overall, she is doing well.  She only experiences intermittent pain.  She rates the pain between a 5 and a 6 when it occurs.  She continues to take Norco for pain control and has good relief with this.  She denies constipation, but says that she has been taking flaxseed to try and prevent this from happening.  She is not currently taking a stool softener.  She is performing sitz baths.  She denies any fevers or chills.  She has not noticed any purulent drainage.  She says that she has some discomfort with sitting for prolonged periods of time, and that there is some burning sensation in the area when she does so.  Vitals:   02/02/19 1000  BP: 125/85  Pulse: 91  Temp: 97.9 F (36.6 C)  SpO2: 98%   Focused examination: The external incision site is healing nicely.  There is no erythema, induration, or drainage.  There is no purulent discharge.  The wound is epithelializing.  Impression and plan: This is a 38 year old woman who had a fistula in ano.  Is doing well from her procedure.  She is aware that there is a higher chance of recurrence and that she may require additional surgery in the future.  I will see her back in 2 to 3 weeks to assess further healing.

## 2019-02-02 NOTE — Patient Instructions (Addendum)
You need to avoid constipation  You may take Colace for a stool softener   Keep your incision clean, wash with soap and water and pat dry.  The burning will get better with time.  Follow up in 2-3 weeks.  Try taking Ibuprofen instead of your Narcotic pain medication

## 2019-02-23 ENCOUNTER — Encounter: Payer: Self-pay | Admitting: General Surgery

## 2019-02-23 ENCOUNTER — Ambulatory Visit (INDEPENDENT_AMBULATORY_CARE_PROVIDER_SITE_OTHER): Payer: Self-pay | Admitting: General Surgery

## 2019-02-23 ENCOUNTER — Other Ambulatory Visit: Payer: Self-pay

## 2019-02-23 VITALS — BP 122/78 | HR 96 | Temp 98.1°F | Ht 63.0 in | Wt 187.0 lb

## 2019-02-23 DIAGNOSIS — K603 Anal fistula: Secondary | ICD-10-CM

## 2019-02-23 NOTE — Progress Notes (Signed)
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  Today, she reports that she is free of pain and feels as though she has been given her life back.  She says the pain had been terrible for over 8 months and now she has complete relief.  She is having normal bowel movements.  She has not had any drainage of any type from the cutaneous site.  Vitals:   02/23/19 0927  BP: 122/78  Pulse: 96  Temp: 98.1 F (36.7 C)  SpO2: 97%   Focused exam: The cutaneous aspect of the fistula has completely scarred in.  There is no erythema, induration, or drainage from the site.  Impression and plan: This is a 38 year old woman who underwent an examination under anesthesia, partial fistulotomy with curettage and application of fibrin glue.  At this time, the fistula seems to be completely closed and she is thrilled with the result.  She is free of pain.  I am hopeful that this will be a durable solution for her, however there is always the risk of recurrence.  I have cautioned her to continue to avoid constipation.  If she has any recurrence, questions, or concerns, I have asked her to contact my office.  For now, we will see her on an as-needed basis.

## 2019-02-23 NOTE — Patient Instructions (Signed)
Follow-up with our office as needed.  Please call and ask to speak with a nurse if you develop questions or concerns.   seguimiento si es necesario

## 2019-06-03 ENCOUNTER — Ambulatory Visit: Payer: Self-pay | Attending: Nurse Practitioner | Admitting: Physician Assistant

## 2019-06-03 ENCOUNTER — Ambulatory Visit: Payer: Self-pay | Attending: Family Medicine | Admitting: Pharmacist

## 2019-06-03 ENCOUNTER — Other Ambulatory Visit: Payer: Self-pay

## 2019-06-03 VITALS — BP 128/85 | Ht 63.0 in | Wt 192.3 lb

## 2019-06-03 DIAGNOSIS — R109 Unspecified abdominal pain: Secondary | ICD-10-CM

## 2019-06-03 DIAGNOSIS — Z23 Encounter for immunization: Secondary | ICD-10-CM

## 2019-06-03 DIAGNOSIS — N926 Irregular menstruation, unspecified: Secondary | ICD-10-CM

## 2019-06-03 DIAGNOSIS — R635 Abnormal weight gain: Secondary | ICD-10-CM

## 2019-06-03 DIAGNOSIS — Z789 Other specified health status: Secondary | ICD-10-CM

## 2019-06-03 LAB — POCT URINALYSIS DIP (CLINITEK)
Bilirubin, UA: NEGATIVE
Glucose, UA: NEGATIVE mg/dL
Ketones, POC UA: NEGATIVE mg/dL
Leukocytes, UA: NEGATIVE
Nitrite, UA: NEGATIVE
POC PROTEIN,UA: NEGATIVE
Spec Grav, UA: >=1.03 — AB (ref 1.010–1.025)
Urobilinogen, UA: 0.2 E.U./dL
pH, UA: 7 (ref 5.0–8.0)

## 2019-06-03 LAB — POCT URINE PREGNANCY: Preg Test, Ur: NEGATIVE

## 2019-06-03 MED ORDER — IBUPROFEN 800 MG PO TABS: 800.0000 mg | ORAL_TABLET | Freq: Three times a day (TID) | ORAL | 1 refills | Status: DC | PRN

## 2019-06-03 MED FILL — IBUPROFEN 800 MG TABLET: 800 | 20 days supply | Qty: 60 | Fill #0

## 2019-06-03 NOTE — Progress Notes (Signed)
Patient ID: Regina Shea, female   DOB: 26-Sep-1980, 38 y.o.   MRN: [id number]   Deane Gettinger, is a 38 y.o. female  [id number]  [id number]  DOB - 1980/10/15  Subjective:  Chief Complaint and HPI: Regina Shea is a 38 y.o. female here today Patient c/o uterine cramping X 1 week.  Describes as uterine contractions that are happening every about every 30 mins and lasts about 5 mins.  She has actually timed these. LMP 05/09/2019.  No birth control.  Took a home pregnancy test and it was negative.  Periods irregular in general.  No vaginal d/c.  No dysuria.    She used to have a Nexplanon in place but had it removed bc of weight gain.  She says she normally weighs about 70kg and now is weighing about 85kg.  No N/V/D.    ROS:   Constitutional:  No f/c, No night sweats, No unexplained weight loss. EENT:  No vision changes, No blurry vision, No hearing changes. No mouth, throat, or ear problems.  Respiratory: No cough, No SOB Cardiac: No CP, no palpitations GI:  No abd pain, No N/V/D. GU: No Urinary s/sx Musculoskeletal: No joint pain Neuro: No headache, no dizziness, no motor weakness.  Skin: No rash Endocrine:  No polydipsia. No polyuria.  Psych: Denies SI/HI  No problems updated.  ALLERGIES: No Known Allergies  PAST MEDICAL HISTORY: Past Medical History:  Diagnosis Date  . Asthma    h/o no inhalers  . Chest pain 01/20/2019  . Headache    migraines    MEDICATIONS AT HOME: Prior to Admission medications   Medication Sig Start Date End Date Taking? Authorizing Provider  ibuprofen (ADVIL) 800 MG tablet Take 1 tablet (800 mg total) by mouth every 8 (eight) hours as needed. 06/03/19   Argentina Donovan, PA-C  traMADol (ULTRAM) 50 MG tablet Take 50 mg by mouth every 6 (six) hours as needed.    [provider]     Objective:  EXAM:   Vitals:   06/03/19 1428  BP: 128/85  SpO2: 99%  Weight: 192 lb 4.8 oz (87.2 kg)  Height: 5\' 3"   (1.6 m)    General appearance : A&OX3. NAD. Non-toxic-appearing HEENT: Atraumatic and Normocephalic.  PERRLA. EOM intact.   Chest/Lungs:  Breathing-non-labored, Good air entry bilaterally, breath sounds normal without rales, rhonchi, or wheezing  CVS: S1 S2 regular, no murmurs, gallops, rubs  Abdomen: Bowel sounds present, Non tender and not distended with no gaurding, rigidity or rebound. Extremities: Bilateral Lower Ext shows no edema, both legs are warm to touch with = pulse throughout Neurology:  CN II-XII grossly intact, Non focal.   Psych:  TP linear. J/I WNL. Normal speech. Appropriate eye contact and affect.  Skin:  No Rash  Data Review Lab Results  Component Value Date   HGBA1C 5.1 01/14/2017   HGBA1C 5.4 02/13/2016     Assessment & Plan   1. Abdominal cramps -doubt contractions-doubt pregnancy due to neg in home and neg in office test.   - POCT urine pregnancy - POCT URINALYSIS DIP (CLINITEK) - hCG, quantitative, pregnancy - Ambulatory referral to Gynecology - Comprehensive metabolic panel - ibuprofen (ADVIL) 800 MG tablet; Take 1 tablet (800 mg total) by mouth every 8 (eight) hours as needed.  Dispense: 60 tablet; Refill: 1  2. Irregular periods - hCG, quantitative, pregnancy - TSH - Vitamin D, 25-hydroxy - Ambulatory referral to Gynecology - Comprehensive metabolic panel  3. Weight gain - TSH - Vitamin  D, 25-hydroxy  4. Language barrier stratus interpreters used and additional time performing visit was required.   Patient have been counseled extensively about nutrition and exercise-spent >25 mins face to face about this and using condoms or seeking other BC if pregnancy is not desired  Return in about 6 months (around 12/01/2019) for PCP appt.  The patient was given clear instructions to go to ER or return to medical center if symptoms don't improve, worsen or new problems develop. The patient verbalized understanding. The patient was told to call to get  lab results if they haven't heard anything in the next week.     Freeman Caldron, PA-C [facility] [place], [place]   06/03/2019, 5:07 PM

## 2019-06-03 NOTE — Patient Instructions (Signed)
Increase water intake

## 2019-06-03 NOTE — Progress Notes (Signed)
Patient presents for vaccination against influenza per orders of Zelda. Consent given. Counseling provided. No contraindications exists. Vaccine administered without incident.   

## 2019-06-04 LAB — COMPREHENSIVE METABOLIC PANEL
ALT: 63 IU/L — ABNORMAL HIGH (ref 0–32)
AST: 30 IU/L (ref 0–40)
Albumin/Globulin Ratio: 1.3 (ref 1.2–2.2)
Albumin: 4.2 g/dL (ref 3.8–4.8)
Alkaline Phosphatase: 123 IU/L — ABNORMAL HIGH (ref 39–117)
BUN/Creatinine Ratio: 20 (ref 9–23)
BUN: 13 mg/dL (ref 6–20)
Bilirubin Total: 0.3 mg/dL (ref 0.0–1.2)
CO2: 24 mmol/L (ref 20–29)
Calcium: 9.5 mg/dL (ref 8.7–10.2)
Chloride: 101 mmol/L (ref 96–106)
Creatinine, Ser: 0.64 mg/dL (ref 0.57–1.00)
GFR calc Af Amer: 131 mL/min/{1.73_m2} (ref >59)
GFR calc non Af Amer: 114 mL/min/{1.73_m2} (ref >59)
Globulin, Total: 3.3 g/dL (ref 1.5–4.5)
Glucose: 88 mg/dL (ref 65–99)
Potassium: 4.1 mmol/L (ref 3.5–5.2)
Sodium: 138 mmol/L (ref 134–144)
Total Protein: 7.5 g/dL (ref 6.0–8.5)

## 2019-06-04 LAB — BETA HCG QUANT (REF LAB): hCG Quant: <1 m[IU]/mL

## 2019-06-04 LAB — TSH: TSH: 2.01 u[IU]/mL (ref 0.450–4.500)

## 2019-06-04 LAB — VITAMIN D 25 HYDROXY (VIT D DEFICIENCY, FRACTURES): Vit D, 25-Hydroxy: 27.2 ng/mL — ABNORMAL LOW (ref 30.0–100.0)

## 2019-07-16 ENCOUNTER — Other Ambulatory Visit: Payer: Self-pay

## 2019-07-16 ENCOUNTER — Encounter: Payer: Self-pay | Admitting: Obstetrics and Gynecology

## 2019-07-16 ENCOUNTER — Ambulatory Visit (INDEPENDENT_AMBULATORY_CARE_PROVIDER_SITE_OTHER): Payer: Self-pay | Admitting: Obstetrics and Gynecology

## 2019-07-16 DIAGNOSIS — N938 Other specified abnormal uterine and vaginal bleeding: Secondary | ICD-10-CM

## 2019-07-16 DIAGNOSIS — Z6839 Body mass index (BMI) 39.0-39.9, adult: Secondary | ICD-10-CM | POA: Insufficient documentation

## 2019-07-16 DIAGNOSIS — O99213 Obesity complicating pregnancy, third trimester: Secondary | ICD-10-CM | POA: Insufficient documentation

## 2019-07-16 DIAGNOSIS — N946 Dysmenorrhea, unspecified: Secondary | ICD-10-CM

## 2019-07-16 NOTE — Patient Instructions (Signed)
Dismenorrea Dysmenorrhea La dismenorrea consiste en tener calambres dolorosos durante la menstruacin (perodo menstrual). Sentir dolor en la zona baja del vientre (abdomen). La causa del dolor son los espasmos (contracciones) de los msculos del tero. El dolor puede ser leve o muy intenso. Con esta afeccin, puede presentar lo siguiente:  Dolor de [place].  Malestar estomacal (nuseas).  Vmitos.  Sentir dolor en la parte inferior de la espalda. Siga estas indicaciones en su casa: Best boy y los calambres   Aplique calor en la parte inferior de la espalda o el vientre cuando tiene dolor o calambres. Use la fuente de calor que el mdico le indique. ? Colquese una [employer] piel y la fuente de Freight forwarder. ? Aplique el calor durante 20 a 70minutos. ? Retire la fuente de calor si la piel se pone de color rojo brillante. Esto es muy importante si no puede Chartered certified accountant, el calor o el fro. ? No duerma con la almohadilla trmica.  Haga ejercicios aerbicos. Estos pueden incluir caminar, nadar o andar en bicicleta. Pueden ayudar a [employer].  Masajee la zona inferior de la espalda o el vientre. Esto puede ayudar a Dietitian. Instrucciones generales  [employer] de venta libre y los recetados solamente como se lo haya indicado el mdico.  No conduzca ni use maquinaria pesada mientras toma analgsicos recetados.  Evite la ingesta de alcohol y cafena inmediatamente antes y durante el perodo menstrual. Esto puede empeorar los calambres.  No consuma ningn producto que contenga nicotina o tabaco. Esto incluye cigarrillos y cigarrillos electrnicos. Si necesita ayuda para dejar de fumar, consulte al mdico.  Concurra a todas las visitas de control como se lo haya indicado el mdico. Esto es importante. Comunquese con un mdico si:  Siente un dolor que empeora.  El dolor no mejora con medicamentos.  Siente dolor durante las [employer].  Siente Higher education careers adviser o tiene vmitos durante el perodo menstrual, que no se van con medicamentos. Solicite ayuda de inmediato si:  Pierde el conocimiento (se desmaya). Resumen  La dismenorrea consiste en tener calambres dolorosos durante la menstruacin (perodo menstrual).  Aplique calor en la parte inferior de la espalda o el vientre cuando tiene dolor o calambres.  Haga ejercicios aerbicos como caminar, nadar o Catering manager.  Comunquese con un mdico si siente dolor durante la relacin sexual. Esta informacin no tiene Marine scientist el consejo del mdico. Asegrese de hacerle al mdico cualquier pregunta que tenga. Document Released: 08/18/2010 Document Revised: 01/17/2017 Document Reviewed: 01/17/2017 Elsevier Patient Education  2020 [place] uterino anormal Abnormal Uterine Bleeding El sangrado uterino anormal sucede cuando el sangrado del tero es ms duradero o ms abundante que lo normal. Esto puede incluir lo siguiente:  Hemorragias entre los perodos [place].  Sangrado luego de [employer].  Sangrado ms abundante que lo normal.  Perodos que duran ms de lo normal.  Sangrado durante la menopausia. Esta afeccin puede ser causada por muchos problemas. Si tiene un sangrado que no considera normal, debe consultar al [employer]. El tratamiento depende de la causa del sangrado. Siga estas indicaciones en su casa:  Controle su afeccin para detectar cualquier cambio.  No use tampones, no se haga duchas vaginales ni mantenga relaciones sexuales si su mdico le dice que no lo haga.  Cambie los apsitos con frecuencia.  Realcese los exmenes de rutina para mujeres. Asegrese de hacerse un examen plvico y de control de cncer de cuello  uterino.  Concurra a todas las visitas de control como se lo haya indicado el mdico. Esto es importante. Comunquese con un mdico si:  El sangrado dura ms de una  semana.  Se siente mareada por momentos.  Siente que va a vomitar (nuseas).  Vomita. Solicite ayuda de inmediato si:  Se desmaya.  Debe cambiarse los apsitos cada hora.  Siente dolor de estmago (abdominal).  Tiene fiebre.  Philbert Riser.  Se debilita.  Elimina cogulos de sangre grandes por la vagina. Resumen  El sangrado uterino anormal sucede cuando el sangrado del tero es ms duradero o ms abundante que lo normal.  Esta afeccin puede ser causada por muchos problemas. Si tiene un sangrado que no considera normal, debe consultar al [employer].  El tratamiento depende de la causa del sangrado. Esta informacin no tiene Marine scientist el consejo del mdico. Asegrese de hacerle al mdico cualquier pregunta que tenga. Document Released: 08/18/2010 Document Revised: 05/21/2017 Document Reviewed: 01/08/2017 Elsevier Patient Education  2020 [employer].

## 2019-07-16 NOTE — Progress Notes (Signed)
Regina Shea presents with c/o irregular cycles. She reports irregular cycles since she started her menses. She will skip months and only have a day of bleeding/spotting. She did have an episode of abd/pelvic cramps last month without cycle. Cramps have since resolved.  She is sexual active without contraception. Has used Depo Provera in the past and most recently Implanon which was removed over a yr ago.  She denies any bowel or bladder dysfunction.  H/O migraine HA, followed by PCP.   TSVD x 4 (largest 61/2 - 7 #)  Has not had a pap smear in several yrs.  PE AF VSS Lungs clear Heart RRR Abd soft +BS Pelvic Nl EGBUS, cervix no lesion uterus @ 10 size, mobile, non tender no adnexal masses  A/P DUB        Dysmenorrhea  Discussed with pt. Will refer to [facility] pap smear. Check GYN U/S. F/U in 4 weeks to discuss results and possible EMBX Live interrupter used during today's visit

## 2019-07-29 ENCOUNTER — Ambulatory Visit (HOSPITAL_COMMUNITY): Payer: Self-pay

## 2019-08-05 ENCOUNTER — Telehealth: Payer: Self-pay | Admitting: Obstetrics and Gynecology

## 2019-08-05 NOTE — Telephone Encounter (Signed)
Called the patient to inform of the upcoming visit with Ultrasound and as well the rescheduled gyn appointment due to the missed ultrasound visit. Received a message the person you are trying to reach has a voicemail box that has not been setup yet. Good-bye

## 2019-08-12 ENCOUNTER — Ambulatory Visit: Payer: Self-pay | Admitting: Obstetrics and Gynecology

## 2019-08-12 ENCOUNTER — Ambulatory Visit (HOSPITAL_COMMUNITY)
Admission: RE | Admit: 2019-08-12 | Discharge: 2019-08-12 | Disposition: A | Discharge status: Home / Self Care | Payer: Self-pay | Source: Ambulatory Visit | Attending: Obstetrics and Gynecology | Admitting: Obstetrics and Gynecology

## 2019-08-12 ENCOUNTER — Other Ambulatory Visit: Payer: Self-pay

## 2019-08-12 DIAGNOSIS — N946 Dysmenorrhea, unspecified: Secondary | ICD-10-CM | POA: Insufficient documentation

## 2019-08-12 DIAGNOSIS — N938 Other specified abnormal uterine and vaginal bleeding: Secondary | ICD-10-CM | POA: Insufficient documentation

## 2019-08-24 ENCOUNTER — Encounter: Payer: Self-pay | Admitting: Obstetrics & Gynecology

## 2019-08-24 ENCOUNTER — Ambulatory Visit: Payer: Self-pay | Admitting: Obstetrics & Gynecology

## 2019-08-25 ENCOUNTER — Telehealth: Payer: Self-pay | Admitting: Nurse Practitioner

## 2019-08-25 NOTE — Telephone Encounter (Signed)
Patient called requesting to speak with Clifton James. Patient states she has all her documentation for her CAFA but has a question regarding her paperwork. Please f/u

## 2019-08-25 NOTE — Telephone Encounter (Signed)
I called back the Pt, unable to reach no VM

## 2019-08-26 ENCOUNTER — Telehealth: Payer: Self-pay | Admitting: Nurse Practitioner

## 2019-08-26 NOTE — Telephone Encounter (Signed)
Pt was called an informed her that her OC and CAFA exp in December, she need to call back on Monday to schedule an appt with financial

## 2019-08-26 NOTE — Telephone Encounter (Signed)
Pt. Returned call. Please f/u °

## 2019-09-09 ENCOUNTER — Other Ambulatory Visit: Payer: Self-pay

## 2019-09-09 ENCOUNTER — Ambulatory Visit: Payer: Self-pay | Attending: Nurse Practitioner | Admitting: Physician Assistant

## 2019-09-09 DIAGNOSIS — H53149 Visual discomfort, unspecified: Secondary | ICD-10-CM

## 2019-09-09 DIAGNOSIS — M62838 Other muscle spasm: Secondary | ICD-10-CM

## 2019-09-09 DIAGNOSIS — G43809 Other migraine, not intractable, without status migrainosus: Secondary | ICD-10-CM

## 2019-09-09 DIAGNOSIS — R110 Nausea: Secondary | ICD-10-CM

## 2019-09-09 DIAGNOSIS — Z789 Other specified health status: Secondary | ICD-10-CM

## 2019-09-09 DIAGNOSIS — Z603 Acculturation difficulty: Secondary | ICD-10-CM

## 2019-09-09 MED ORDER — PROMETHAZINE HCL 25 MG PO TABS: ORAL_TABLET | ORAL | 0 refills | Status: DC

## 2019-09-09 MED ORDER — NAPROXEN 500 MG PO TABS: 500.0000 mg | ORAL_TABLET | Freq: Two times a day (BID) | ORAL | 0 refills | Status: DC

## 2019-09-09 MED ORDER — METHOCARBAMOL 500 MG PO TABS: 1000.0000 mg | ORAL_TABLET | Freq: Three times a day (TID) | ORAL | 0 refills | Status: DC | PRN

## 2019-09-09 MED FILL — PROMETHAZINE 25 MG TABLET: 25 | 5 days supply | Qty: 30 | Fill #0

## 2019-09-09 MED FILL — METHOCARBAMOL 500 MG TABS: 500 | 30 days supply | Qty: 90 | Fill #0

## 2019-09-09 MED FILL — NAPROXEN 500 MG TABLET: 500 | 30 days supply | Qty: 60 | Fill #0

## 2019-09-09 NOTE — Progress Notes (Signed)
Patient ID: Regina Shea, female   DOB: 1981/02/23, 39 y.o.   MRN: [id number] Virtual Visit via Telephone Note  I connected with Regina Shea on 09/09/19 at  3:50 PM EST by telephone and verified that I am speaking with the correct person using two identifiers.   I discussed the limitations, risks, security and privacy concerns of performing an evaluation and management service by telephone and the availability of in person appointments. I also discussed with the patient that there may be a patient responsible charge related to this service. The patient expressed understanding and agreed to proceed.  PATIENT visit by telephone virtually in the context of Covid-19 pandemic. Patient location:  home My Location:  [place] office Persons on the call:  Me, the patient, and interpreter  History of Present Illness: Patient calling complaining of HA "migraines."  Also c/o pain in upper back radiating into the L arm.  Sometimes feels the L side of face is numb.  This has been going on for years.  Pain is severe at times and she gets muscle spasms/"knots" in her neck.  OTC migraine medication does not help.  She has the HA/migraine about twice a week.  +nausea.  +photophobia.  No vomiting.    Previously diagnosed with "emotional migraines" when she was in [place].  She does not know the name of the meds she took there.  Denies neurology w/up in [place].  Labs 05/2019 reviewed and essentially unremarkable.     Observations/Objective: NAD.  A&Ox3   Assessment and Plan: 1. Other migraine without status migrainosus, not intractable To ED if worsens - Ambulatory referral to Neurology - naproxen (NAPROSYN) 500 MG tablet; Take 1 tablet (500 mg total) by mouth 2 (two) times daily with a meal. Prn pain  Dispense: 60 tablet; Refill: 0  2. Photophobia Secondary to #1  3. Nausea - promethazine (PHENERGAN) 25 MG tablet; 1/2-1 tablet as needed for nausea every 4-6 hours  Dispense: 30 tablet;  Refill: 0  4. Language barrier pacific interpreters used and additional time performing visit was required.   5. Muscle spasm - methocarbamol (ROBAXIN) 500 MG tablet; Take 2 tablets (1,000 mg total) by mouth every 8 (eight) hours as needed for muscle spasms.  Dispense: 90 tablet; Refill: 0    Follow Up Instructions: See PCP 3-4 months   I discussed the assessment and treatment plan with the patient. The patient was provided an opportunity to ask questions and all were answered. The patient agreed with the plan and demonstrated an understanding of the instructions.   The patient was advised to call back or seek an in-person evaluation if the symptoms worsen or if the condition fails to improve as anticipated.  I provided 17 minutes of non-face-to-face time during this encounter.   Freeman Caldron, PA-C

## 2019-09-09 NOTE — Progress Notes (Signed)
C /o upper back pain radiatiating to the shoulder, unable to lift above head her L  Arm. Numbness on her Left side of the face when she is stressed  And has headache. Pain 8/10 Stated HX   Of severe migraines headache treated in [place] with a strong medicine/

## 2019-09-14 ENCOUNTER — Ambulatory Visit: Payer: Self-pay | Attending: Internal Medicine

## 2019-09-14 DIAGNOSIS — Z23 Encounter for immunization: Secondary | ICD-10-CM | POA: Insufficient documentation

## 2019-09-14 NOTE — Progress Notes (Signed)
   Covid-19 Vaccination Clinic  Name:  Regina Shea    MRN: [id number] DOB: 11/24/1980  09/14/2019  [name] was observed post Covid-19 immunization for 15 minutes without incidence. She was provided with Vaccine Information Sheet and instruction to access the V-Safe system.   [name] was instructed to call 911 with any severe reactions post vaccine: Marland Kitchen Difficulty breathing  . Swelling of your face and throat  . A fast heartbeat  . A bad rash all over your body  . Dizziness and weakness    Immunizations Administered    Name Date Dose VIS Date Route   Pfizer COVID-19 Vaccine 09/14/2019  5:45 PM 0.3 mL 07/10/2019 Intramuscular   Manufacturer: [place]   Lot: [id number]   [place]: [id number]

## 2019-09-23 ENCOUNTER — Ambulatory Visit: Payer: Self-pay | Attending: Nurse Practitioner

## 2019-09-23 ENCOUNTER — Other Ambulatory Visit: Payer: Self-pay

## 2019-10-06 ENCOUNTER — Ambulatory Visit: Payer: Self-pay

## 2019-10-07 ENCOUNTER — Ambulatory Visit: Payer: Self-pay

## 2019-10-14 ENCOUNTER — Ambulatory Visit: Payer: Self-pay | Attending: Internal Medicine

## 2019-10-14 DIAGNOSIS — Z23 Encounter for immunization: Secondary | ICD-10-CM

## 2019-10-14 NOTE — Progress Notes (Signed)
   Covid-19 Vaccination Clinic  Name:  Regina Shea    MRN: [id number] DOB: 1981/03/31  10/14/2019  [name] was observed post Covid-19 immunization for 15 minutes without incident. She was provided with Vaccine Information Sheet and instruction to access the V-Safe system.   [name] was instructed to call 911 with any severe reactions post vaccine: Marland Kitchen Difficulty breathing  . Swelling of face and throat  . A fast heartbeat  . A bad rash all over body  . Dizziness and weakness   Immunizations Administered    Name Date Dose VIS Date Route   Pfizer COVID-19 Vaccine 10/14/2019 12:04 PM 0.3 mL 07/10/2019 Intramuscular   Manufacturer: [place]   Lot: [id number]   [place]: [id number]

## 2020-02-28 ENCOUNTER — Inpatient Hospital Stay (HOSPITAL_COMMUNITY): Payer: Self-pay

## 2020-02-28 ENCOUNTER — Encounter (HOSPITAL_COMMUNITY): Payer: Self-pay

## 2020-02-28 ENCOUNTER — Inpatient Hospital Stay (HOSPITAL_COMMUNITY)
Admission: AD | Admit: 2020-02-28 | Discharge: 2020-02-29 | Disposition: A | Discharge status: Home / Self Care | Payer: Self-pay | Attending: Obstetrics & Gynecology | Admitting: Obstetrics & Gynecology

## 2020-02-28 DIAGNOSIS — J45909 Unspecified asthma, uncomplicated: Secondary | ICD-10-CM | POA: Insufficient documentation

## 2020-02-28 DIAGNOSIS — O26899 Other specified pregnancy related conditions, unspecified trimester: Secondary | ICD-10-CM | POA: Insufficient documentation

## 2020-02-28 DIAGNOSIS — O99519 Diseases of the respiratory system complicating pregnancy, unspecified trimester: Secondary | ICD-10-CM | POA: Insufficient documentation

## 2020-02-28 DIAGNOSIS — Z3A00 Weeks of gestation of pregnancy not specified: Secondary | ICD-10-CM | POA: Insufficient documentation

## 2020-02-28 DIAGNOSIS — O4690 Antepartum hemorrhage, unspecified, unspecified trimester: Secondary | ICD-10-CM

## 2020-02-28 DIAGNOSIS — O021 Missed abortion: Secondary | ICD-10-CM | POA: Insufficient documentation

## 2020-02-28 DIAGNOSIS — R109 Unspecified abdominal pain: Secondary | ICD-10-CM | POA: Insufficient documentation

## 2020-02-28 DIAGNOSIS — O209 Hemorrhage in early pregnancy, unspecified: Secondary | ICD-10-CM

## 2020-02-28 LAB — URINALYSIS, ROUTINE W REFLEX MICROSCOPIC
Bilirubin Urine: NEGATIVE
Glucose, UA: NEGATIVE mg/dL
Ketones, ur: NEGATIVE mg/dL
Leukocytes,Ua: NEGATIVE
Nitrite: NEGATIVE
Protein, ur: 30 mg/dL — AB
Specific Gravity, Urine: 1.01 (ref 1.005–1.030)
pH: 6 (ref 5.0–8.0)

## 2020-02-28 LAB — COMPREHENSIVE METABOLIC PANEL
ALT: 21 U/L (ref 0–44)
AST: 18 U/L (ref 15–41)
Albumin: 3.9 g/dL (ref 3.5–5.0)
Alkaline Phosphatase: 85 U/L (ref 38–126)
Anion gap: 10 (ref 5–15)
BUN: 10 mg/dL (ref 6–20)
CO2: 24 mmol/L (ref 22–32)
Calcium: 9 mg/dL (ref 8.9–10.3)
Chloride: 105 mmol/L (ref 98–111)
Creatinine, Ser: 0.59 mg/dL (ref 0.44–1.00)
GFR calc Af Amer: >60 mL/min (ref >60)
GFR calc non Af Amer: >60 mL/min (ref >60)
Glucose, Bld: 95 mg/dL (ref 70–99)
Potassium: 3.8 mmol/L (ref 3.5–5.1)
Sodium: 139 mmol/L (ref 135–145)
Total Bilirubin: 0.5 mg/dL (ref 0.3–1.2)
Total Protein: 7.1 g/dL (ref 6.5–8.1)

## 2020-02-28 LAB — CBC
HCT: 44 % (ref 36.0–46.0)
Hemoglobin: 14.2 g/dL (ref 12.0–15.0)
MCH: 28.3 pg (ref 26.0–34.0)
MCHC: 32.3 g/dL (ref 30.0–36.0)
MCV: 87.6 fL (ref 80.0–100.0)
Platelets: 330 10*3/uL (ref 150–400)
RBC: 5.02 MIL/uL (ref 3.87–5.11)
RDW: 13 % (ref 11.5–15.5)
WBC: 11.2 10*3/uL — ABNORMAL HIGH (ref 4.0–10.5)
nRBC: 0 % (ref 0.0–0.2)

## 2020-02-28 LAB — I-STAT BETA HCG BLOOD, ED (MC, WL, AP ONLY): I-stat hCG, quantitative: >2000 m[IU]/mL — ABNORMAL HIGH (ref <5)

## 2020-02-28 LAB — SAMPLE TO BLOOD BANK

## 2020-02-28 LAB — URINALYSIS, MICROSCOPIC (REFLEX)
Bacteria, UA: NONE SEEN
RBC / HPF: >50 RBC/hpf (ref 0–5)
Squamous Epithelial / HPF: NONE SEEN (ref 0–5)
WBC, UA: NONE SEEN WBC/hpf (ref 0–5)

## 2020-02-28 NOTE — MAU Note (Signed)
Pt reports to MAU transferred from the ED. Pt states that yesterday she began spotting and then she felt cramps and believed it was menstrual cramps. Today around 1530 she started bleeding more, went to the restroom and felt something rather large come out and it looked like a "sac". She continued to bleed and went to the ED. Pt estimates she has gone through 10 pads today but with in the last few hours she states the bleeding has significantly slowed down.

## 2020-02-28 NOTE — ED Provider Notes (Signed)
MSE was initiated and I personally evaluated the patient and placed orders (if any) at  10:16 PM on February 28, 2020.  The patient appears stable so that the remainder of the MSE may be completed by another provider.  Regina Shea is a 39 y.o. female G4P4 presents with heavy vaginal bleeding.  Pt with irregular menses and no menses for the last 2 mos (normal for patient).  Pt reports heavy bleeding today with large clots.  Also reports passing a large "ball of tissue" several hours ago. She has moderate to severe lower abd pain.  Pt unaware that she was pregnant.    BP (!) 132/86   Pulse 92   Temp 98.3 F (36.8 C) (Oral)   Resp 16   LMP 12/29/2018   SpO2 100%   Face to face Exam:   General: Awake  HEENT: Atraumatic  Resp: Normal effort  Abd: Nondistended, TTP in the lower quadrants  Neuro:No focal weakness     Results for orders placed or performed during the hospital encounter of 02/28/20  Comprehensive metabolic panel  Result Value Ref Range   Sodium 139 135 - 145 mmol/L   Potassium 3.8 3.5 - 5.1 mmol/L   Chloride 105 98 - 111 mmol/L   CO2 24 22 - 32 mmol/L   Glucose, Bld 95 70 - 99 mg/dL   BUN 10 6 - 20 mg/dL   Creatinine, Ser 0.59 0.44 - 1.00 mg/dL   Calcium 9.0 8.9 - 10.3 mg/dL   Total Protein 7.1 6.5 - 8.1 g/dL   Albumin 3.9 3.5 - 5.0 g/dL   AST 18 15 - 41 U/L   ALT 21 0 - 44 U/L   Alkaline Phosphatase 85 38 - 126 U/L   Total Bilirubin 0.5 0.3 - 1.2 mg/dL   GFR calc non Af Amer >60 >60 mL/min   GFR calc Af Amer >60 >60 mL/min   Anion gap 10 5 - 15  CBC  Result Value Ref Range   WBC 11.2 (H) 4.0 - 10.5 K/uL   RBC 5.02 3.87 - 5.11 MIL/uL   Hemoglobin 14.2 12.0 - 15.0 g/dL   HCT 44.0 36 - 46 %   MCV 87.6 80.0 - 100.0 fL   MCH 28.3 26.0 - 34.0 pg   MCHC 32.3 30.0 - 36.0 g/dL   RDW 13.0 11.5 - 15.5 %   Platelets 330 150 - 400 K/uL   nRBC 0.0 0.0 - 0.2 %  Urinalysis, Routine w reflex microscopic  Result Value Ref Range   Color, Urine RED (A) YELLOW    APPearance HAZY (A) CLEAR   Specific Gravity, Urine 1.010 1.005 - 1.030   pH 6.0 5.0 - 8.0   Glucose, UA NEGATIVE NEGATIVE mg/dL   Hgb urine dipstick LARGE (A) NEGATIVE   Bilirubin Urine NEGATIVE NEGATIVE   Ketones, ur NEGATIVE NEGATIVE mg/dL   Protein, ur 30 (A) NEGATIVE mg/dL   Nitrite NEGATIVE NEGATIVE   Leukocytes,Ua NEGATIVE NEGATIVE  Urinalysis, Microscopic (reflex)  Result Value Ref Range   RBC / HPF >50 0 - 5 RBC/hpf   WBC, UA NONE SEEN 0 - 5 WBC/hpf   Bacteria, UA NONE SEEN NONE SEEN   Squamous Epithelial / LPF NONE SEEN 0 - 5  I-Stat beta hCG blood, ED  Result Value Ref Range   I-stat hCG, quantitative >2,000.0 (H) <5 mIU/mL   Comment 3          Sample to Blood Bank  Result Value Ref Range  Blood Bank Specimen SAMPLE AVAILABLE FOR TESTING    Sample Expiration      02/29/2020,2359 Performed at [facility], [place] [address], [place], [place] [address]     10:29 PM Discussed with MAU APP, Sam, who accepts in transfer.     Vaginal bleeding in pregnancy      [employer] 02/28/20 Kennard, Ankit, MD 02/28/20 2317

## 2020-02-28 NOTE — ED Triage Notes (Signed)
Pt arrives POV for eval of heavy vaginal bleeding. Pt reports irregular periods, states no period for a while and noted heavy bleeding w/ clots this afternoon. Pt reports ongoing heavy vaginal bleeding, large clots and unknown pregnancy status. Reports something "really large" came out of her, but unsure of what it was. Pt reports it was not a clot.

## 2020-02-29 DIAGNOSIS — O021 Missed abortion: Secondary | ICD-10-CM

## 2020-02-29 LAB — TYPE AND SCREEN
ABO/RH(D): A POS
Antibody Screen: NEGATIVE

## 2020-02-29 LAB — CULTURE, OB URINE: Culture: <10000 — AB

## 2020-02-29 LAB — HCG, QUANTITATIVE, PREGNANCY: hCG, Beta Chain, Quant, S: 10012 m[IU]/mL — ABNORMAL HIGH (ref <5)

## 2020-02-29 NOTE — Discharge Instructions (Signed)
Aborto espontneo Miscarriage El aborto espontneo es la prdida de un beb que no ha nacido (feto) antes de la semana20 del embarazo. Siga estas indicaciones en su casa: Medicamentos   Tome los medicamentos de venta libre y los recetados solamente como se lo haya indicado el mdico.  Si le recetaron un antibitico, tmelo como se lo haya indicado el mdico. No deje de tomar los antibiticos aunque comience a sentirse mejor.  No tome antiinflamatorios no esteroideos (AINE), a menos que el mdico le diga que son seguros para usted. Estos incluyen aspirina e ibuprofeno. Estos medicamentos pueden provocarle sangrado. Actividad  Haga reposo segn lo indicado. Pregntele al mdico qu actividades son seguras para usted.  Pida ayuda para realizar las tareas de la casa durante este tiempo. Instrucciones generales  Anote cuntos apsitos usa por da y cun saturados estn.  Observe la cantidad de tejido o grumos de sangre (cogulos de sangre) que expulsa por la vagina. Guarde las cantidades grandes de tejido para llevrselas al mdico.  No use tampones, no se haga duchas vaginales ni tenga relaciones sexuales hasta que el mdico la autorice.  Para que usted y su pareja puedan sobrellevar el proceso de duelo, hable con su mdico o busque apoyo psicolgico.  Cuando est lista, acuda al mdico para hablar sobre los pasos que debe seguir para cuidar su salud. Adems, hable con su mdico sobre las medidas que debe adoptar para tener un embarazo saludable en el futuro.  Concurra a todas las visitas de seguimiento como se lo haya indicado el mdico. Esto es importante. Comunquese con un mdico si:  Tiene fiebre o siente escalofros.  Tiene una secrecin vaginal con mal olor.  Aumenta el sangrado. Solicite ayuda de inmediato si:  Tiene espasmos o dolor muy intensos en el abdomen o en la espalda.  Elimina grumos de sangre por la vagina, que tienen el tamao de una nuez o ms.  Elimina  tejido por la vagina, que tiene el tamao de una nuez o ms.  Empapa ms de un apsito de tamao normal por hora.  Se siente dbil o mareada.  Pierde el conocimiento (se desmaya).  Siente tristeza que no se va o piensa en lastimarse. Resumen  El aborto espontneo es la prdida de un beb que no ha nacido antes de la semana20 del embarazo.  Siga las indicaciones de su mdico para el cuidado en su hogar. Concurra a todas las visitas de control.  Para que usted y su pareja puedan sobrellevar el proceso de duelo, hable con su mdico o busque apoyo psicolgico. Esta informacin no tiene como fin reemplazar el consejo del mdico. Asegrese de hacerle al mdico cualquier pregunta que tenga. Document Revised: 04/22/2017 Document Reviewed: 04/22/2017 Elsevier Patient Education  2020 Elsevier Inc.  

## 2020-02-29 NOTE — MAU Provider Note (Signed)
History     CSN: [id number]  Arrival date and time: 02/28/20 3846   First Provider Initiated Contact with Patient 02/29/20 0039      Chief Complaint  Patient presents with  . Vaginal Bleeding   HPI Regina Shea is a 39 y.o. [id number] at Unknown who presents to MAU from [facility] with chief complaint of heavy vaginal bleeding in setting of positive pregnancy test in [facility]. Patient states she started bleeding 02/27/2020 but the amount worsened significantly throughout the day. She has an extensive history of irregular menstrual cycles and did not know she was pregnant. She endorses seeing a gestational sac 02/28/2020 at 1430 in her commode at home. She also reports mild abdominal cramping "like my period" but denies dysuria, lightheadedness, fever or recent illness.  OB History    Gravida  5   Para  4   Term  4   Preterm  0   AB  0   Living  4     SAB  0   TAB  0   Ectopic  0   Multiple  0   Live Births  4           Past Medical History:  Diagnosis Date  . Asthma    h/o no inhalers  . Chest pain 01/20/2019  . Fistula-in-ano   . Headache    migraines    Past Surgical History:  Procedure Laterality Date  . NO PAST SURGERIES    . RECTAL EXAM UNDER ANESTHESIA N/A 01/23/2019   Procedure: RECTAL EXAM UNDER ANESTHESIA, with FISTULOTOMY and  FIBRIN GLUE;  Surgeon: Fredirick Maudlin, MD;  Location: [facility] ORS;  Service: General;  Laterality: N/A;    Family History  Problem Relation Age of Onset  . Hypertension Mother   . Hypertension Father   . Cancer Maternal Uncle     Social History   Tobacco Use  . Smoking status: Never Smoker  . Smokeless tobacco: Never Used  Vaping Use  . Vaping Use: Never used  Substance Use Topics  . Alcohol use: No  . Drug use: No    Allergies: No Known Allergies  Medications Prior to Admission  Medication Sig Dispense Refill Last Dose  . methocarbamol (ROBAXIN) 500 MG tablet Take 2 tablets (1,000 mg total) by mouth  every 8 (eight) hours as needed for muscle spasms. 90 tablet 0   . naproxen (NAPROSYN) 500 MG tablet Take 1 tablet (500 mg total) by mouth 2 (two) times daily with a meal. Prn pain 60 tablet 0   . promethazine (PHENERGAN) 25 MG tablet 1/2-1 tablet as needed for nausea every 4-6 hours 30 tablet 0   . traMADol (ULTRAM) 50 MG tablet Take 50 mg by mouth every 6 (six) hours as needed.       Review of Systems  Gastrointestinal: Positive for abdominal pain.  Genitourinary: Positive for vaginal bleeding.  All other systems reviewed and are negative.  Physical Exam   Blood pressure 119/72, pulse 74, temperature 98.2 F (36.8 C), temperature source Oral, resp. rate 16, SpO2 100 %.  Physical Exam Vitals and nursing note reviewed. Exam conducted with a chaperone present.  Constitutional:      General: She is not in acute distress.    Appearance: Normal appearance. She is not ill-appearing, toxic-appearing or diaphoretic.  Cardiovascular:     Rate and Rhythm: Normal rate.     Pulses: Normal pulses.     Heart sounds: Normal heart sounds.  Pulmonary:  Effort: Pulmonary effort is normal.     Breath sounds: Normal breath sounds.  Abdominal:     General: Abdomen is flat.  Genitourinary:    Comments: Pelvic exam: External genitalia normal, vaginal walls pink and well rugated, cervix visually closed, no lesions noted. Large clot within vaginal vault, scant blood at cervical os. Removed with faux swabs x 4.  Skin:    General: Skin is warm.     Capillary Refill: Capillary refill takes less than 2 seconds.  Neurological:     General: No focal deficit present.     Mental Status: She is alert.     MAU Course  Procedures: speculum exam, formal TVUS  Patient Vitals for the past 24 hrs:  BP Temp Temp src Pulse Resp SpO2  02/29/20 0136 121/72 -- -- 75 -- --  02/28/20 2325 119/72 -- -- 74 -- 100 %  02/28/20 2323 -- 98.2 F (36.8 C) Oral -- 16 100 %  02/28/20 1941 (!) 132/86 98.3 F (36.8 C)  Oral 92 16 100 %  02/28/20 1859 (!) 122/86 98.2 F (36.8 C) Oral 87 20 99 %   Results for orders placed or performed during the hospital encounter of 02/28/20 (from the past 24 hour(s))  Urinalysis, Routine w reflex microscopic     Status: Abnormal   Collection Time: 02/28/20  8:38 PM  Result Value Ref Range   Color, Urine RED (A) YELLOW   APPearance HAZY (A) CLEAR   Specific Gravity, Urine 1.010 1.005 - 1.030   pH 6.0 5.0 - 8.0   Glucose, UA NEGATIVE NEGATIVE mg/dL   Hgb urine dipstick LARGE (A) NEGATIVE   Bilirubin Urine NEGATIVE NEGATIVE   Ketones, ur NEGATIVE NEGATIVE mg/dL   Protein, ur 30 (A) NEGATIVE mg/dL   Nitrite NEGATIVE NEGATIVE   Leukocytes,Ua NEGATIVE NEGATIVE  Urinalysis, Microscopic (reflex)     Status: None   Collection Time: 02/28/20  8:38 PM  Result Value Ref Range   RBC / HPF >50 0 - 5 RBC/hpf   WBC, UA NONE SEEN 0 - 5 WBC/hpf   Bacteria, UA NONE SEEN NONE SEEN   Squamous Epithelial / LPF NONE SEEN 0 - 5  Sample to Blood Bank     Status: None   Collection Time: 02/28/20  8:45 PM  Result Value Ref Range   Blood Bank Specimen SAMPLE AVAILABLE FOR TESTING    Sample Expiration      02/29/2020,2359 Performed at [facility], [address], [place], [place] [address]   Type and screen     Status: None   Collection Time: 02/28/20  8:45 PM  Result Value Ref Range   ABO/RH(D) A POS    Antibody Screen NEG    Sample Expiration      03/02/2020,2359 Performed at [facility], [place] [address] [place], [place] [address]   Comprehensive metabolic panel     Status: None   Collection Time: 02/28/20  8:54 PM  Result Value Ref Range   Sodium 139 135 - 145 mmol/L   Potassium 3.8 3.5 - 5.1 mmol/L   Chloride 105 98 - 111 mmol/L   CO2 24 22 - 32 mmol/L   Glucose, Bld 95 70 - 99 mg/dL   BUN 10 6 - 20 mg/dL   Creatinine, Ser 0.59 0.44 - 1.00 mg/dL   Calcium 9.0 8.9 - 10.3 mg/dL   Total Protein 7.1 6.5 - 8.1 g/dL   Albumin 3.9 3.5 - 5.0 g/dL  AST 18 15 -  41 U/L   ALT 21 0 - 44 U/L   Alkaline Phosphatase 85 38 - 126 U/L   Total Bilirubin 0.5 0.3 - 1.2 mg/dL   GFR calc non Af Amer >60 >60 mL/min   GFR calc Af Amer >60 >60 mL/min   Anion gap 10 5 - 15  CBC     Status: Abnormal   Collection Time: 02/28/20  8:54 PM  Result Value Ref Range   WBC 11.2 (H) 4.0 - 10.5 K/uL   RBC 5.02 3.87 - 5.11 MIL/uL   Hemoglobin 14.2 12.0 - 15.0 g/dL   HCT 44.0 36 - 46 %   MCV 87.6 80.0 - 100.0 fL   MCH 28.3 26.0 - 34.0 pg   MCHC 32.3 30.0 - 36.0 g/dL   RDW 13.0 11.5 - 15.5 %   Platelets 330 150 - 400 K/uL   nRBC 0.0 0.0 - 0.2 %  I-Stat beta hCG blood, ED     Status: Abnormal   Collection Time: 02/28/20  9:43 PM  Result Value Ref Range   I-stat hCG, quantitative >2,000.0 (H) <5 mIU/mL   Comment 3          hCG, quantitative, pregnancy     Status: Abnormal   Collection Time: 02/29/20  1:03 AM  Result Value Ref Range   hCG, Beta Chain, Quant, S 10,012 (H) <5 mIU/mL   US OB LESS THAN 14 WEEKS WITH OB TRANSVAGINAL  Result Date: 02/29/2020 CLINICAL DATA:  Initial evaluation for acute pelvic pain, vaginal bleeding. EXAM: OBSTETRIC <14 WK [place] AND TRANSVAGINAL OB US TECHNIQUE: Both transabdominal and transvaginal ultrasound examinations were performed for complete evaluation of the gestation as well as the maternal uterus, adnexal regions, and pelvic cul-de-sac. Transvaginal technique was performed to assess early pregnancy. COMPARISON:  None available. FINDINGS: Intrauterine gestational sac: Single. Yolk sac:  Negative. Embryo:  Present Cardiac Activity: Negative. Heart Rate: Negative. CRL: 7.1 mm   6 w   4 d                  [place] EDC: N/A Subchorionic hemorrhage:  None visualized. Maternal uterus/adnexae: Ovaries within normal limits bilaterally. No adnexal mass or free fluid. Hypoechoic material within the endometrial cavity at the lower uterine segment/endocervical canal most likely reflects blood products. IMPRESSION: 1. Single intrauterine gestational sac with  internal embryo, but no detectable cardiac activity. Crown-rump length measures 7.1 mm. Findings meet definitive criteria for failed pregnancy. This follows SRU consensus guidelines: Diagnostic Criteria for Nonviable Pregnancy Early in the First Trimester. Alison Stalling J Med [phone]. 2. No other acute maternal uterine or adnexal abnormality identified. Electronically Signed   By: Jeannine Boga M.D.   On: 02/29/2020 00:35   Assessment and Plan  --39 y.o.  [id number] with missed ab --Blood type A POS --Patient prefers expectant management --Language barrier: iPad interpreter utilized for all patient interaction --Discharge home in stable condition  F/U: --Non-Stat Quant hCG at [facility] in one week --Patient may reconsider Cytotec at that time PRN  Darlina Rumpf, CNM 02/29/2020, 4:56 AM

## 2020-03-04 ENCOUNTER — Encounter (HOSPITAL_COMMUNITY): Payer: Self-pay | Admitting: Family Medicine

## 2020-03-04 ENCOUNTER — Inpatient Hospital Stay (HOSPITAL_COMMUNITY): Payer: Self-pay

## 2020-03-04 ENCOUNTER — Other Ambulatory Visit: Payer: Self-pay

## 2020-03-04 ENCOUNTER — Inpatient Hospital Stay (HOSPITAL_COMMUNITY)
Admission: AD | Admit: 2020-03-04 | Discharge: 2020-03-04 | Disposition: A | Discharge status: Home / Self Care | Payer: Self-pay | Attending: Family Medicine | Admitting: Family Medicine

## 2020-03-04 DIAGNOSIS — O26891 Other specified pregnancy related conditions, first trimester: Secondary | ICD-10-CM | POA: Insufficient documentation

## 2020-03-04 DIAGNOSIS — Z79899 Other long term (current) drug therapy: Secondary | ICD-10-CM | POA: Insufficient documentation

## 2020-03-04 DIAGNOSIS — J45909 Unspecified asthma, uncomplicated: Secondary | ICD-10-CM | POA: Insufficient documentation

## 2020-03-04 DIAGNOSIS — R109 Unspecified abdominal pain: Secondary | ICD-10-CM | POA: Insufficient documentation

## 2020-03-04 DIAGNOSIS — Z8249 Family history of ischemic heart disease and other diseases of the circulatory system: Secondary | ICD-10-CM | POA: Insufficient documentation

## 2020-03-04 DIAGNOSIS — O99511 Diseases of the respiratory system complicating pregnancy, first trimester: Secondary | ICD-10-CM | POA: Insufficient documentation

## 2020-03-04 DIAGNOSIS — O039 Complete or unspecified spontaneous abortion without complication: Secondary | ICD-10-CM | POA: Insufficient documentation

## 2020-03-04 DIAGNOSIS — Z3A01 Less than 8 weeks gestation of pregnancy: Secondary | ICD-10-CM | POA: Insufficient documentation

## 2020-03-04 NOTE — Discharge Instructions (Signed)
Como sobrellevar la prdida de un embarazo Managing Pregnancy Loss La prdida del embarazo puede ocurrir en cualquier momento durante un embarazo. Generalmente no se conoce la causa. Rara vez se debe a algo que usted hizo. La prdida del embarazo a comienzos del embarazo (durante el primer trimestre) se denomina aborto espontneo. Este es el tipo de prdida de un embarazo ms frecuente. La prdida del embarazo que ocurre despus de las 20 semanas de embarazo se denomina muerte fetal si el corazn del beb deja de latir antes del nacimiento. La muerte fetal es mucho menos frecuente. Algunas mujeres experimentan trabajo de parto espontneo poco despus de la muerte fetal, lo que ocasiona el nacimiento del beb muerto. Cualquier prdida de un embarazo puede ser devastadora. Tendr que recuperarse fsica y emocionalmente. La mayora de las mujeres son capaces de quedar embarazadas nuevamente despus de la prdida de un embarazo y dar a luz un beb sano. Cmo manejar la recuperacin emocional  La prdida de un embarazo es muy difcil emocionalmente. Es posible que sienta muchas emociones distintas mientras hace el duelo. Puede sentirse triste y enojada. Tambin puede sentir culpa. Es normal tener perodos de llanto. La recuperacin emocional puede llevar ms tiempo que la recuperacin fsica. Es diferente para cada persona. Estas medidas pueden ayudarla a sobrellevar esta prdida:  Recuerde que es poco probable que haya hecho algo para causar la prdida del embarazo.  Comparta sus pensamientos y sentimientos con su pareja, familiares y amigos. Recuerde que su pareja tambin se est recuperando emocionalmente.  Asegrese de tener un buen sistema de apoyo. No pase demasiado tiempo sola.  Renase con un consejero sobre prdida de embarazos o nase a un grupo de apoyo para prdida de embarazos.  Duerma lo suficiente y siga una dieta sana. Regrese a la actividad fsica con regularidad cuando se haya  recuperado fsicamente.  No consuma drogas ni alcohol para controlar sus emociones.  Considere consultar a un profesional de salud mental para que la ayude a recuperarse emocionalmente.  Pdale a un amigo o un ser querido que la ayude a decidir qu hacer con la ropa y los artculos infantiles que recibi para el beb. En el caso de muerte fetal, muchas mujeres se benefician al tomar medidas adicionales en el proceso del duelo. Es recomendable que:  Sostenga al beb despus del nacimiento.  Le ponga un nombre al beb.  Solicite un certificado de nacimiento.  Cree un recuerdo, como las huellas de las manos o de los pies.  Vista al beb y pida que le tomen una fotografa.  Haga arreglos para un funeral.  Solicite un bautismo o una bendicin. Los hospitales tienen personal que puede ayudarla a organizar todo esto. Cmo reconocer el estrs emocional Es normal tener estrs emocional despus de perder un embarazo. Pero el estrs emocional que dura mucho tiempo o se vuelve grave requiere tratamiento. Tenga cuidado con estos signos de estrs emocional grave:  Tristeza, ira o culpa, que no desaparecen y estn interfiriendo en sus actividades normales.  Problemas de relacin que hayan surgido o empeorado desde la prdida del embarazo.  Signos de depresin que duran ms de 2 semanas. Pueden incluir: ? Tristeza. ? Ansiedad. ? Desesperanza. ? Falta de inters en las actividades que disfruta. ? Incapacidad para concentrarse. ? Dificultad para dormir o dormir demasiado. ? Prdida del apetito o comer en exceso. ? Pensamientos sobre la muerte o de hacerse dao a s misma. Siga estas instrucciones en su casa:  Use los medicamentos de venta libre   y los recetados solamente como se lo haya indicado el mdico.  Haga reposo en su casa hasta que su nivel de energa regrese. Reanude sus actividades normales segn lo indicado por el mdico. Pregntele al mdico qu actividades son seguras para  usted.  Cuando est lista, visite a su mdico para hablar sobre las medidas que debe tomar para futuros embarazos.  Concurra a todas las visitas de seguimiento como se lo haya indicado el mdico. Esto es importante. Dnde encontrar apoyo  Para que usted y su pareja puedan sobrellevar el proceso del duelo, hable con su mdico o busque apoyo psicolgico.  Considere la posibilidad de reunirse con otras mujeres que hayan sufrido la prdida de un embarazo. Consulte al mdico sobre distintos grupos de apoyo y recursos. Dnde buscar ms informacin  U.S. Department of Health and Human Services, Office on Women's Health (Departamento de Salud y Servicios Sociales de los Estados Unidos, Oficina de Salud de la Mujer): www.womenshealth.gov  American Pregnancy Association (Asociacin Americana del Embarazo): www.americanpregnancy.org. Comunquese con un mdico si:  Contina sintiendo pena, tristeza o falta de motivacin para realizar las actividades diarias, y estos sentimientos no mejoran con el tiempo.  Tiene problemas para recuperarse emocionalmente, en especial si est consumiendo alcohol o sustancias para ayudar. Solicite ayuda inmediatamente si:  Piensa en lastimarse a usted misma o a otras personas. Si alguna vez siente que puede lastimarse a usted misma o lastimar a otras personas, o tiene pensamientos de poner fin a su vida, busque ayuda de inmediato. Puede dirigirse al servicio de emergencias ms cercano o comunicarse con:  El servicio de emergencias de su localidad (911 en [place]  Una lnea de asistencia al suicida y atencin en crisis, como National Suicide Prevention Lifeline (Lnea Nacional de Prevencin del Suicidio), al 1-800-273-8255. Est disponible las 24 horas del da. Resumen  Cualquier prdida de un embarazo puede ser difcil fsica y emocionalmente.  Es posible que tenga muchas emociones distintas mientras hace el duelo. La recuperacin emocional puede durar ms tiempo  que la recuperacin fsica.  Es normal tener estrs emocional despus de perder un embarazo. Pero el estrs emocional que dura mucho tiempo o se vuelve grave requiere tratamiento.  Consulte a su mdico si tiene problemas emocionales despus de la prdida de un embarazo. Esta informacin no tiene como fin reemplazar el consejo del mdico. Asegrese de hacerle al mdico cualquier pregunta que tenga. Document Revised: 11/10/2018 Document Reviewed: 10/26/2017 Elsevier Patient Education  2020 Elsevier Inc.  Aborto espontneo Miscarriage El aborto espontneo es la prdida de un beb que no ha nacido (feto) antes de la semana20 del embarazo. La mayor parte de los abortos espontneos ocurre durante los primeros 3meses de embarazo. A veces, un aborto ocurre antes de que la mujer sepa que est embarazada. El aborto espontneo puede ser una experiencia que afecte emocionalmente a la persona. Si ha sufrido un aborto espontneo, hable con su mdico y hgale las preguntas que tenga sobre el aborto espontneo, el proceso de duelo y los planes futuros de embarazo. Cules son las causas? Entre las causas de un aborto espontneo se incluyen las siguientes:  Problemas genticos o cromosmicos del feto. Estos problemas impiden que el beb se desarrolle con normalidad. En general, son el resultado de errores fortuitos que ocurren en la etapa temprana del desarrollo y que no se transmiten de padres a hijos (no se heredan).  Infeccin en el cuello del tero.  Trastornos que afectan el equilibrio hormonal del organismo.  Problemas en el cuello   del tero, como su adelgazamiento y apertura antes de que el embarazo llegue a trmino (insuficiencia del cuello de tero).  Problemas en el tero. Estos pueden incluir, entre otros, los siguientes: ? Forma anormal del tero. ? Fibromas en el tero. ? Anormalidades congnitas. Estos son problemas que ya estaban presentes en el nacimiento.  Ciertas enfermedades  crnicas.  Fumar, beber alcohol o usar drogas.  Lesiones (traumatismos). En muchos de los casos, se desconoce la causa de los abortos espontneos. Cules son los signos o los sntomas? Los sntomas de esta afeccin incluyen los siguientes:  Sangrado o manchado vaginal, con o sin clicos o dolor.  Dolor o clicos en el abdomen o en la parte inferior de la espalda.  Eliminacin de lquido, tejidos o cogulos sanguneos por la vagina. Cmo se diagnostica? Esta afeccin se puede diagnosticar en funcin de lo siguiente:  Examen fsico.  Ecografa.  Anlisis de sangre.  Anlisis de orina. Cmo se trata? En algunos casos, el tratamiento de un aborto espontneo no es necesario si se eliminan de forma natural todos los tejidos que se encontraban en el tero. Si fuera necesario realizar un tratamiento por esta afeccin, este puede incluir lo siguiente:  Dilatacin y curetaje (D&C). Mediante este procedimiento, se expande el cuello del tero y se raspan las paredes (endometrio). Esto se realiza solamente si queda tejido del feto o la placenta dentro del cuerpo (aborto espontneo incompleto).  Medicamentos, por ejemplo: ? Antibiticos para tratar una infeccin. ? Medicamentos para ayudar al cuerpo a eliminar los restos de tejido. ? Medicamentos para reducir (contraer) el tamao del tero. Estos medicamentos se pueden administrar si tiene un sangrado abundante. Si su factor sanguneo es Rhnegativo y el de su beb es Rhpositivo, usted necesitar una inyeccin del medicamento llamado inmunoglobulinaRhpara proteger a los bebs futuros de tener problemas con el factorsanguneoRh. Los trminos "Rhnegativo" y "Rhpositivo" hacen referencia a la presencia o no en la sangre de una protena especfica que se encuentra en la superficie de los glbulos rojos (factor Rh). Siga estas indicaciones en su casa: Medicamentos   Tome los medicamentos de venta libre y los recetados solamente como se lo  haya indicado el mdico.  Si le recetaron antibiticos, tmelos como se lo haya indicado el mdico. No deje de tomar los antibiticos aunque comience a sentirse mejor.  No tome antiinflamatorios no esteroideos (AINE), tales como aspirina e ibuprofeno, a menos que se lo indique el mdico. Estos medicamentos pueden provocarle sangrado. Actividad  Haga reposo segn lo indicado. Pregntele al mdico qu actividades son seguras para usted.  Pdale a alguien que la ayude con las responsabilidades familiares y del hogar durante este tiempo. Instrucciones generales  Lleve un registro de la cantidad y la saturacin de las toallas higinicas que utiliza cada da. Anote esta informacin.  Anote la cantidad de tejido o cogulos sanguneos que expulsa por la vagina. Guarde las cantidades grandes de tejidos para que el mdico los examine.  No use tampones, no se haga duchas vaginales ni tenga relaciones sexuales hasta que el mdico la autorice.  Para que usted y su pareja puedan sobrellevar el proceso del duelo, hable con su mdico o busque apoyo psicolgico.  Cuando est lista, visite a su mdico para hablar sobre los pasos importantes que deber seguir en relacin con su salud. Tambin hable sobre las medidas que deber tomar para tener un embarazo saludable en el futuro.  Concurra a todas las visitas de seguimiento como se lo haya indicado el mdico. Esto es   importante. Dnde encontrar ms informacin  Colegio Estadounidense de Obstetras y Gineclogos (American College of Obstetricians and Gynecologists): www.acog.org  Departamento de Salud y Servicios Humanos de los Estados Unidos, Oficina de Salud de la Mujer (U.S. Department of Health and Human Services, Office on Women's Health): www.womenshealth.gov Comunquese con un mdico si:  Tiene fiebre o siente escalofros.  Tiene una secrecin vaginal con mal olor.  El sangrado aumenta en vez de disminuir. Solicite ayuda de inmediato  si:  Siente calambres intensos o dolor en la espalda o en el abdomen.  Elimina cogulos de sangre o tejido por la vagina del tamao de una nuez o ms grandes.  Necesita ms de una toalla higinica de tamao regular por hora.  Se siente mareada o dbil.  Se desmaya.  Siente una tristeza que la invade o piensa en lastimarse. Resumen  La mayor parte de los abortos espontneos ocurre durante los primeros 3meses de embarazo. En algunos casos, el aborto espontneo ocurre antes de que la mujer sepa que est embarazada.  Siga las indicaciones del mdico para el cuidado en el hogar. Concurra a todas las visitas de control.  Para que usted y su pareja puedan sobrellevar el proceso del duelo, hable con su mdico o busque apoyo psicolgico. Esta informacin no tiene como fin reemplazar el consejo del mdico. Asegrese de hacerle al mdico cualquier pregunta que tenga. Document Revised: 04/22/2017 Document Reviewed: 04/22/2017 Elsevier Patient Education  2020 Elsevier Inc.  

## 2020-03-04 NOTE — MAU Note (Signed)
Sunday started Bleeding, was dx with miscarriage (no FH). Passed everything on the 3rd, was told to come back today, to make sure complete. Having a lot of pain and spasms in her abd.  Bleeding less today, changing every 3 hrs.  Since miscarriage, no clots.  No fever or diarrhea since passed miscarriage

## 2020-03-04 NOTE — MAU Provider Note (Signed)
History   [id number]  *Spanish interpreter used for this encounter*  Chief Complaint  Patient presents with  . Follow-up    HPI Regina Shea is a 39 y.o. female  [phone] here with for follow-up ultrasound.  Upon review of the records patient was first seen on 8/1 for vaginal bleeding. Had ultrasound that day that confirmed miscarriage; IUP [redacted]w[redacted]d with no cardiac activity.  Patient states a few days ago she had a heavy bleed & passed tissue that she thinks is the baby. Continues to have some cramping & bleeding but is better than the other day. Very upset & concerned that she passed the baby. Has not heard from the office yet about her follow up appointment.   No LMP recorded (lmp unknown). Patient is pregnant.  OB History  Gravida Para Term Preterm AB Living  5 4 4  0 0 4  SAB TAB Ectopic Multiple Live Births  0 0 0 0 4    # Outcome Date GA Lbr Len/2nd Weight Sex Delivery Anes PTL Lv  5 Gravida           4 Term 02/21/16 [redacted]w[redacted]d 09:25 / 00:09 260[address] g [address] EPI  LIV  3 Term 05/05/13 [redacted]w[redacted]d 06:01 / 00:29 3550 g M Vag-Spont EPI  LIV  2 Term 09/25/06 [redacted]w[redacted]d  2722 g M Vag-Spont   LIV  1 Term 01/15/05 [redacted]w[redacted]d  2722 g M Vag-Spont None  LIV     Birth Comments: cord wrapped around neck    Past Medical History:  Diagnosis Date  . Asthma    h/o no inhalers  . Chest pain 01/20/2019  . Fistula-in-ano   . Headache    migraines    Family History  Problem Relation Age of Onset  . Hypertension Mother   . Hypertension Father   . Cancer Maternal Uncle     Social History   Socioeconomic History  . Marital status: Married    Spouse name: Not on file  . Number of children: Not on file  . Years of education: Not on file  . Highest education level: Not on file  Occupational History  . Not on file  Tobacco Use  . Smoking status: Never Smoker  . Smokeless tobacco: Never Used  Vaping Use  . Vaping Use: Never used  Substance and Sexual Activity  . Alcohol use: No  . Drug  use: No  . Sexual activity: Yes    Birth control/protection: None  Other Topics Concern  . Not on file  Social History Narrative  . Not on file   Social Determinants of Health   Financial Resource Strain:   . Difficulty of Paying Living Expenses:   Food Insecurity:   . Worried About Charity fundraiser in the Last Year:   . Arboriculturist in the Last Year:   Transportation Needs:   . Film/video editor (Medical):   Marland Kitchen Lack of Transportation (Non-Medical):   Physical Activity:   . Days of Exercise per Week:   . Minutes of Exercise per Session:   Stress:   . Feeling of Stress :   Social Connections:   . Frequency of Communication with Friends and Family:   . Frequency of Social Gatherings with Friends and Family:   . Attends Religious Services:   . Active Member of Clubs or Organizations:   . Attends Archivist Meetings:   Marland Kitchen Marital Status:     No Known Allergies  No current facility-administered medications  on file prior to encounter.   Current Outpatient Medications on File Prior to Encounter  Medication Sig Dispense Refill  . methocarbamol (ROBAXIN) 500 MG tablet Take 2 tablets (1,000 mg total) by mouth every 8 (eight) hours as needed for muscle spasms. 90 tablet 0  . naproxen (NAPROSYN) 500 MG tablet Take 1 tablet (500 mg total) by mouth 2 (two) times daily with a meal. Prn pain 60 tablet 0  . promethazine (PHENERGAN) 25 MG tablet 1/2-1 tablet as needed for nausea every 4-6 hours 30 tablet 0  . traMADol (ULTRAM) 50 MG tablet Take 50 mg by mouth every 6 (six) hours as needed.       Physical Exam   Vitals:   03/04/20 1352  BP: 121/75  Pulse: 95  Resp: 18  Temp: 98.5 F (36.9 C)  TempSrc: Oral  SpO2: 100%  Weight: 82.1 kg    Physical Exam Vitals and nursing note reviewed.  Constitutional:      Appearance: Normal appearance. She is not diaphoretic.  HENT:     Head: Normocephalic and atraumatic.  Pulmonary:     Effort: Pulmonary effort is  normal. No respiratory distress.  Neurological:     Mental Status: She is alert.  Psychiatric:        Mood and Affect: Mood normal.        Behavior: Behavior normal.        Thought Content: Thought content normal.     MAU Course  Procedures US OB Transvaginal  Result Date: 03/04/2020 CLINICAL DATA:  Recent spontaneous abortion EXAM: TRANSVAGINAL OB ULTRASOUND TECHNIQUE: Transvaginal ultrasound was performed for complete evaluation of the gestation as well as the maternal uterus, adnexal regions, and pelvic cul-de-sac. COMPARISON:  February 28, 2020. FINDINGS: Intrauterine gestational sac: Not visualized. Previously seen intrauterine pregnancy is no longer visualized. Subchorionic hemorrhage: None Right ovary: Unremarkable.  It measures 2.1 x 1.1 by 1.1 cm. Left ovary: Unremarkable. It measures 2.1 x 2.2 x 1.6 cm. Endometrium:The endometrium measures 9 mm in thickness. It is decreased in comparison to prior. Echogenic material within the cervix likely reflecting blood products. Free fluid:  None IMPRESSION: Previously seen intrauterine pregnancy is no longer visualized. Decrease in size of the endometrium. No definitive retained products of conception. Electronically Signed   By: Valentino Saxon MD   On: 03/04/2020 15:11    MDM RH positive  Ultrasound performed today & confirms that patient has passed the pregnancy. No evidence of retained products at this time.   Reviewed results with patient who is appropriately upset. Will send message to the office for SAB f/u in 2 weeks. Reviewed bleeding precautions with patient. Pelvic rest.   Assessment and Plan   1. Miscarriage   2. Abdominal pain during pregnancy in first trimester    P: Discharge in stable condition Bleeding precautions Tylenol or ibuprofen for pain Pelvic rest Msg to [facility] for f/u appt in 2 weeks   Jorje Guild, NP 03/04/2020 3:40 PM

## 2020-03-23 ENCOUNTER — Ambulatory Visit: Payer: Self-pay | Admitting: Obstetrics & Gynecology

## 2020-03-24 ENCOUNTER — Other Ambulatory Visit: Payer: Self-pay

## 2020-03-24 ENCOUNTER — Ambulatory Visit: Payer: Self-pay

## 2020-04-18 ENCOUNTER — Inpatient Hospital Stay (HOSPITAL_COMMUNITY): Payer: Self-pay

## 2020-04-18 ENCOUNTER — Inpatient Hospital Stay (HOSPITAL_COMMUNITY)
Admission: AD | Admit: 2020-04-18 | Discharge: 2020-04-18 | Disposition: A | Discharge status: Home / Self Care | Payer: Self-pay | Attending: Obstetrics & Gynecology | Admitting: Obstetrics & Gynecology

## 2020-04-18 ENCOUNTER — Encounter (HOSPITAL_COMMUNITY): Payer: Self-pay | Admitting: Obstetrics & Gynecology

## 2020-04-18 ENCOUNTER — Other Ambulatory Visit: Payer: Self-pay

## 2020-04-18 ENCOUNTER — Other Ambulatory Visit (HOSPITAL_COMMUNITY)
Admission: RE | Admit: 2020-04-18 | Discharge: 2020-04-18 | Disposition: A | Discharge status: Home / Self Care | Payer: Self-pay | Source: Ambulatory Visit | Attending: Obstetrics and Gynecology | Admitting: Obstetrics and Gynecology

## 2020-04-18 ENCOUNTER — Ambulatory Visit (INDEPENDENT_AMBULATORY_CARE_PROVIDER_SITE_OTHER): Payer: Self-pay | Admitting: *Deleted

## 2020-04-18 VITALS — BP 154/71 | HR 82 | Temp 98.5°F | Ht 61.02 in | Wt 184.2 lb

## 2020-04-18 DIAGNOSIS — Z3A01 Less than 8 weeks gestation of pregnancy: Secondary | ICD-10-CM

## 2020-04-18 DIAGNOSIS — O4691 Antepartum hemorrhage, unspecified, first trimester: Secondary | ICD-10-CM

## 2020-04-18 DIAGNOSIS — O26891 Other specified pregnancy related conditions, first trimester: Secondary | ICD-10-CM

## 2020-04-18 DIAGNOSIS — O209 Hemorrhage in early pregnancy, unspecified: Secondary | ICD-10-CM | POA: Insufficient documentation

## 2020-04-18 DIAGNOSIS — N898 Other specified noninflammatory disorders of vagina: Secondary | ICD-10-CM | POA: Insufficient documentation

## 2020-04-18 DIAGNOSIS — N949 Unspecified condition associated with female genital organs and menstrual cycle: Secondary | ICD-10-CM | POA: Insufficient documentation

## 2020-04-18 DIAGNOSIS — Z3200 Encounter for pregnancy test, result unknown: Secondary | ICD-10-CM

## 2020-04-18 DIAGNOSIS — Z6790 Unspecified blood type, Rh positive: Secondary | ICD-10-CM

## 2020-04-18 DIAGNOSIS — R109 Unspecified abdominal pain: Secondary | ICD-10-CM

## 2020-04-18 DIAGNOSIS — O99511 Diseases of the respiratory system complicating pregnancy, first trimester: Secondary | ICD-10-CM | POA: Insufficient documentation

## 2020-04-18 DIAGNOSIS — O3680X0 Pregnancy with inconclusive fetal viability, not applicable or unspecified: Secondary | ICD-10-CM

## 2020-04-18 DIAGNOSIS — J45909 Unspecified asthma, uncomplicated: Secondary | ICD-10-CM | POA: Insufficient documentation

## 2020-04-18 LAB — COMPREHENSIVE METABOLIC PANEL
ALT: 26 U/L (ref 0–44)
AST: 20 U/L (ref 15–41)
Albumin: 3.5 g/dL (ref 3.5–5.0)
Alkaline Phosphatase: 76 U/L (ref 38–126)
Anion gap: 10 (ref 5–15)
BUN: 10 mg/dL (ref 6–20)
CO2: 23 mmol/L (ref 22–32)
Calcium: 8.8 mg/dL — ABNORMAL LOW (ref 8.9–10.3)
Chloride: 102 mmol/L (ref 98–111)
Creatinine, Ser: 0.65 mg/dL (ref 0.44–1.00)
GFR calc Af Amer: >60 mL/min (ref >60)
GFR calc non Af Amer: >60 mL/min (ref >60)
Glucose, Bld: 111 mg/dL — ABNORMAL HIGH (ref 70–99)
Potassium: 3.7 mmol/L (ref 3.5–5.1)
Sodium: 135 mmol/L (ref 135–145)
Total Bilirubin: 0.3 mg/dL (ref 0.3–1.2)
Total Protein: 7.1 g/dL (ref 6.5–8.1)

## 2020-04-18 LAB — POCT URINALYSIS DIPSTICK OB
Bilirubin, UA: NEGATIVE
Glucose, UA: NEGATIVE
Ketones, UA: NEGATIVE
Leukocytes, UA: NEGATIVE
Nitrite, UA: NEGATIVE
POC,PROTEIN,UA: NEGATIVE
Spec Grav, UA: >=1.03 — AB (ref 1.010–1.025)
Urobilinogen, UA: 0.2 E.U./dL
pH, UA: 5.5 (ref 5.0–8.0)

## 2020-04-18 LAB — URINALYSIS, ROUTINE W REFLEX MICROSCOPIC
Bacteria, UA: NONE SEEN
Bilirubin Urine: NEGATIVE
Glucose, UA: NEGATIVE mg/dL
Ketones, ur: NEGATIVE mg/dL
Leukocytes,Ua: NEGATIVE
Nitrite: NEGATIVE
Protein, ur: NEGATIVE mg/dL
Specific Gravity, Urine: 1.01 (ref 1.005–1.030)
pH: 7 (ref 5.0–8.0)

## 2020-04-18 LAB — CBC
HCT: 40.2 % (ref 36.0–46.0)
Hemoglobin: 13.1 g/dL (ref 12.0–15.0)
MCH: 28 pg (ref 26.0–34.0)
MCHC: 32.6 g/dL (ref 30.0–36.0)
MCV: 85.9 fL (ref 80.0–100.0)
Platelets: 322 10*3/uL (ref 150–400)
RBC: 4.68 MIL/uL (ref 3.87–5.11)
RDW: 13.1 % (ref 11.5–15.5)
WBC: 11.4 10*3/uL — ABNORMAL HIGH (ref 4.0–10.5)
nRBC: 0 % (ref 0.0–0.2)

## 2020-04-18 LAB — HCG, QUANTITATIVE, PREGNANCY: hCG, Beta Chain, Quant, S: 9147 m[IU]/mL — ABNORMAL HIGH (ref <5)

## 2020-04-18 LAB — POCT URINE PREGNANCY: Preg Test, Ur: POSITIVE — AB

## 2020-04-18 NOTE — Discharge Instructions (Signed)
Hemorragia vaginal durante el embarazo (primer trimestre) Vaginal Bleeding During Pregnancy, First Trimester  Durante los primeros meses del [place], es comn tener una pequea hemorragia vaginal [place]). A veces, la hemorragia es normal y no causa problemas. En otras ocasiones, sin embargo, la hemorragia puede ser una seal de algo grave. Informe a su mdico de inmediato si tiene algn tipo de hemorragia vaginal. Siga estas indicaciones en su casa: Actividad  Siga las indicaciones de su mdico con respecto al grado de actividad que puede Optometrist.  De ser necesario, organcese para que alguien la ayude con las actividades habituales.  No tenga relaciones sexuales ni orgasmos hasta que el mdico le diga que es seguro. Instrucciones generales  [employer] de venta libre y los recetados solamente como se lo haya indicado el mdico.  Controle su afeccin para Actuary cambio.  Escriba los siguientes datos: ? La cantidad de toallas higinicas que [place] cada da. ? La frecuencia con la que se cambia las toallas higinicas. ? Qu tan empapadas (saturadas) estn.  No use tampones.  No se haga duchas vaginales.  Si elimina tejido por la vagina, gurdelo para mostrrselo al [employer].  Concurra a todas las visitas de seguimiento como se lo haya indicado el mdico. Esto es importante. Comunquese con un mdico si:  Tiene una hemorragia vaginal durante el embarazo.  Tiene clicos.  Tiene fiebre. Solicite ayuda de inmediato si:  Siente clicos muy intensos en la espalda o en el vientre (abdomen).  Elimina cogulos grandes o mucho tejido por la vagina.  La hemorragia empeora.  Siente que va a desvanecerse.  Se siente dbil.  Pierde el conocimiento (se desmaya).  Tiene escalofros.  Tiene una prdida de lquido por la vagina.  Tiene una prdida de lquido abundante por la vagina. Resumen  A veces, la hemorragia vaginal durante el embarazo es normal y no  causa problemas. En otras ocasiones, la hemorragia puede ser [employer] de algo grave.  Informe a su mdico de inmediato si tiene algn tipo de hemorragia vaginal.  Siga las indicaciones de su mdico con respecto al grado de actividad que puede Optometrist. Quiz necesite que alguien la ayude con las actividades habituales. Esta informacin no tiene Marine scientist el consejo del mdico. Asegrese de hacerle al mdico cualquier pregunta que tenga. Document Revised: 04/11/2017 Document Reviewed: 04/11/2017 Elsevier Patient Education  [place].

## 2020-04-18 NOTE — MAU Provider Note (Addendum)
History     CSN: [id number]  Arrival date and time: 04/18/20 7893   First Provider Initiated Contact with Patient 04/18/20 2039      Chief Complaint  Patient presents with  . Possible Pregnancy  . Vaginal Bleeding   HPI Regina Shea is a 39 y.o. [id number] at Unknown who presents to MAU with chief complaint of heavy vaginal bleeding. This is a new problem, onset yesterday 04/17/2020. She also c/o lower abdominal pain, onset coinciding with onset of bleeding. She rates her lower abdominal pain as 5/10, non-radiating. She denies aggravating or alleviating factors. She has not taken medication or tried other treatments for these complaints.  Patient is s/p miscarriage in August 2021. She endorses negative home pregnancy test the third week of August followed by a few days of spotting then a positive home pregnancy test on 08/30 or 08/31.   She denies abdominal tenderness, dysuria, fever or recent illness. She is remote from sexual intercourse.  OB History    Gravida  5   Para  4   Term  4   Preterm  0   AB  0   Living  4     SAB  0   TAB  0   Ectopic  0   Multiple  0   Live Births  4           Past Medical History:  Diagnosis Date  . Asthma    h/o no inhalers  . Chest pain 01/20/2019  . Fistula-in-ano   . Headache    migraines    Past Surgical History:  Procedure Laterality Date  . NO PAST SURGERIES    . RECTAL EXAM UNDER ANESTHESIA N/A 01/23/2019   Procedure: RECTAL EXAM UNDER ANESTHESIA, with FISTULOTOMY and  FIBRIN GLUE;  Surgeon: Fredirick Maudlin, MD;  Location: [facility] ORS;  Service: General;  Laterality: N/A;    Family History  Problem Relation Age of Onset  . Hypertension Mother   . Hypertension Father   . Cancer Maternal Uncle     Social History   Tobacco Use  . Smoking status: Never Smoker  . Smokeless tobacco: Never Used  Vaping Use  . Vaping Use: Never used  Substance Use Topics  . Alcohol use: No  . Drug use: No     Allergies: No Known Allergies  No medications prior to admission.    Review of Systems  Gastrointestinal: Positive for abdominal pain.  Genitourinary: Positive for vaginal bleeding.  All other systems reviewed and are negative.  Physical Exam   Blood pressure 128/72, pulse 98, temperature 98 F (36.7 C), temperature source Oral, resp. rate 17, last menstrual period 03/28/2020, SpO2 100 %.  Physical Exam Vitals and nursing note reviewed. Exam conducted with a chaperone present.  Constitutional:      Appearance: Normal appearance.  Cardiovascular:     Rate and Rhythm: Normal rate.  Pulmonary:     Effort: Pulmonary effort is normal.     Breath sounds: Normal breath sounds.  Abdominal:     General: Abdomen is flat.     Tenderness: There is no right CVA tenderness or left CVA tenderness.  Genitourinary:    Comments: External: no lesions or erythema Vagina: rugated, pink, moist, scant brown discharge Uterus: non enlarged, anteverted, non tender, no CMT Adnexae: no masses, no tenderness left, no tenderness right Cervix closed/long  Musculoskeletal:        General: Normal range of motion.  Skin:    General: Skin is  warm and dry.     Capillary Refill: Capillary refill takes less than 2 seconds.  Neurological:     General: No focal deficit present.     Mental Status: She is alert.  Psychiatric:        Mood and Affect: Mood normal.     MAU Course  Procedures  --Language barrier: remote Spanish language interpreter utilized for all patient interaction  Orders Placed This Encounter  Procedures  . US OB Transvaginal  . hCG, quantitative, pregnancy  . CBC  . Comprehensive metabolic panel  . Urinalysis, Routine w reflex microscopic Urine, Clean Catch   Patient Vitals for the past 24 hrs:  BP Temp Temp src Pulse Resp SpO2  04/18/20 2022 128/72 98 F (36.7 C) Oral 98 17 100 %   Report given to M. Drake Leach, CNM who assumes care of patient at this time.  Mallie Snooks, MSN, CNM Certified Nurse Midwife, [facility] for [employer], [facility] [facility] 04/18/20 10:06 PM   Results for orders placed or performed during the hospital encounter of 04/18/20 (from the past 24 hour(s))  Urinalysis, Routine w reflex microscopic Urine, Clean Catch     Status: Abnormal   Collection Time: 04/18/20  8:40 PM  Result Value Ref Range   Color, Urine YELLOW YELLOW   APPearance CLEAR CLEAR   Specific Gravity, Urine 1.010 1.005 - 1.030   pH 7.0 5.0 - 8.0   Glucose, UA NEGATIVE NEGATIVE mg/dL   Hgb urine dipstick SMALL (A) NEGATIVE   Bilirubin Urine NEGATIVE NEGATIVE   Ketones, ur NEGATIVE NEGATIVE mg/dL   Protein, ur NEGATIVE NEGATIVE mg/dL   Nitrite NEGATIVE NEGATIVE   Leukocytes,Ua NEGATIVE NEGATIVE   RBC / HPF 0-5 0 - 5 RBC/hpf   WBC, UA 0-5 0 - 5 WBC/hpf   Bacteria, UA NONE SEEN NONE SEEN   Squamous Epithelial / LPF 0-5 0 - 5   Mucus PRESENT   hCG, quantitative, pregnancy     Status: Abnormal   Collection Time: 04/18/20  8:58 PM  Result Value Ref Range   hCG, Beta Chain, Quant, S 9,147 (H) <5 mIU/mL  CBC     Status: Abnormal   Collection Time: 04/18/20  8:58 PM  Result Value Ref Range   WBC 11.4 (H) 4.0 - 10.5 K/uL   RBC 4.68 3.87 - 5.11 MIL/uL   Hemoglobin 13.1 12.0 - 15.0 g/dL   HCT 40.2 36 - 46 %   MCV 85.9 80.0 - 100.0 fL   MCH 28.0 26.0 - 34.0 pg   MCHC 32.6 30.0 - 36.0 g/dL   RDW 13.1 11.5 - 15.5 %   Platelets 322 150 - 400 K/uL   nRBC 0.0 0.0 - 0.2 %  Comprehensive metabolic panel     Status: Abnormal   Collection Time: 04/18/20  8:58 PM  Result Value Ref Range   Sodium 135 135 - 145 mmol/L   Potassium 3.7 3.5 - 5.1 mmol/L   Chloride 102 98 - 111 mmol/L   CO2 23 22 - 32 mmol/L   Glucose, Bld 111 (H) 70 - 99 mg/dL   BUN 10 6 - 20 mg/dL   Creatinine, Ser 0.65 0.44 - 1.00 mg/dL   Calcium 8.8 (L) 8.9 - 10.3 mg/dL   Total Protein 7.1 6.5 - 8.1 g/dL   Albumin 3.5 3.5 - 5.0 g/dL   AST 20 15 - 41 U/L   ALT 26  0 - 44 U/L   Alkaline Phosphatase 76  38 - 126 U/L   Total Bilirubin 0.3 0.3 - 1.2 mg/dL   GFR calc non Af Amer >60 >60 mL/min   GFR calc Af Amer >60 >60 mL/min   Anion gap 10 5 - 15   US OB Transvaginal  Result Date: 04/18/2020 CLINICAL DATA:  History of prior spontaneous abortion with pelvic pain EXAM: TRANSVAGINAL OB ULTRASOUND TECHNIQUE: Transvaginal ultrasound was performed for complete evaluation of the gestation as well as the maternal uterus, adnexal regions, and pelvic cul-de-sac. COMPARISON:  03/04/2020, 02/29/2020 FINDINGS: Intrauterine gestational sac: Small gestational sac is noted. Yolk sac:  Absent Embryo:  Absent MSD: 9.7 mm mm   5 weeks 5 days Subchorionic hemorrhage:  None visualized. Maternal uterus/adnexae: Ovaries are within normal limits. No free fluid is seen. IMPRESSION: Probable early intrauterine gestational sac, but no yolk sac, fetal pole, or cardiac activity yet visualized. Recommend follow-up quantitative B-HCG levels and follow-up US in 14 days to assess viability. This recommendation follows SRU consensus guidelines: Diagnostic Criteria for Nonviable Pregnancy Early in the First Trimester. [place] Med 2013; 321:2248-25. Electronically Signed   By: Inez Catalina M.D.   On: 04/18/2020 22:13   MDM: IUGS but no YS or FP seen on [place], findings could indicate early pregnancy, ectopic pregnancy, or failed pregnancy, discussed with pt. Will follow quant in 48 hrs. GC/CT collected at office today. Stable for discharge home.   Assessment and Plan   1. Pregnancy, location unknown   2. Vaginal bleeding in pregnancy, first trimester   3. Abdominal cramping affecting pregnancy   4. Blood type, Rh positive    Discharge home Follow up at [place] on 9/23 @0830  Ectopic/SAB precautions Pelvic rest  Allergies as of 04/18/2020   No Known Allergies     Medication List    You have not been prescribed any medications.    Live interpreter present for all interactions  Julianne Handler, [place]  04/18/2020 11:32 PM

## 2020-04-18 NOTE — MAU Note (Signed)
Pt reports had a miscarriage on 08/03, last week she had 3 positive home preg tests. Yesterday she began having bleeding and pain. [facility] and [facility] did some bloodwork, urine and told her that her b/p was elevated.

## 2020-04-18 NOTE — Progress Notes (Signed)
    SUBJECTIVE: Regina Shea 39 y.o. presents today for UPT. She complains of vaginal bleeding (spotting) x 2-3 days and clear and ichy vaginal discharge for 1 month(s). Denies significant pelvic pain or fever. Denies history of known exposure to STD.  She had recent miscarriage August 2021. Patient did not follow up for any lab work following miscarriage.  No LMP recorded (lmp unknown).  OBJECTIVE:  She appears well, afebrile. Urine dipstick: positive for RBC's. Home UPT Result: Positive pregnancy test x 3 In-Office UPT result: Positive  OB History    Gravida  5   Para  4   Term  4   Preterm  0   AB  0   Living  4     SAB  0   TAB  0   Ectopic  0   Multiple  0   Live Births  4          ASSESSMENT: Positive UPT  Vaginal Discharge  Vaginal Itching/Burning   PLAN:  GC, chlamydia, trichomonas, BVAG, CVAG probe sent to lab. Treatment: To be determined once lab results are received ROV prn if symptoms persist or worsen. Beta Hcg Urine culture  Derl Barrow, RN

## 2020-04-19 ENCOUNTER — Ambulatory Visit: Payer: Self-pay | Admitting: Family

## 2020-04-19 LAB — CERVICOVAGINAL ANCILLARY ONLY
Bacterial Vaginitis (gardnerella): POSITIVE — AB
Candida Glabrata: NEGATIVE
Candida Vaginitis: POSITIVE — AB
Chlamydia: NEGATIVE
Comment: NEGATIVE
Comment: NEGATIVE
Comment: NEGATIVE
Comment: NEGATIVE
Comment: NEGATIVE
Comment: NORMAL
Neisseria Gonorrhea: NEGATIVE
Trichomonas: NEGATIVE

## 2020-04-19 LAB — BETA HCG QUANT (REF LAB): hCG Quant: 5162 m[IU]/mL

## 2020-04-20 LAB — URINE CULTURE, OB REFLEX: Organism ID, Bacteria: NO GROWTH

## 2020-04-20 LAB — CULTURE, OB URINE

## 2020-04-21 ENCOUNTER — Ambulatory Visit (INDEPENDENT_AMBULATORY_CARE_PROVIDER_SITE_OTHER): Payer: Self-pay | Admitting: *Deleted

## 2020-04-21 ENCOUNTER — Ambulatory Visit: Payer: Self-pay | Attending: Family | Admitting: Family

## 2020-04-21 ENCOUNTER — Other Ambulatory Visit: Payer: Self-pay

## 2020-04-21 DIAGNOSIS — Z789 Other specified health status: Secondary | ICD-10-CM

## 2020-04-21 DIAGNOSIS — L989 Disorder of the skin and subcutaneous tissue, unspecified: Secondary | ICD-10-CM

## 2020-04-21 DIAGNOSIS — O3680X0 Pregnancy with inconclusive fetal viability, not applicable or unspecified: Secondary | ICD-10-CM

## 2020-04-21 DIAGNOSIS — O209 Hemorrhage in early pregnancy, unspecified: Secondary | ICD-10-CM

## 2020-04-21 LAB — BETA HCG QUANT (REF LAB): hCG Quant: 12099 m[IU]/mL

## 2020-04-21 NOTE — Patient Instructions (Signed)
Referral to Dermatology for skin bumps. Report back to emergency department for vaginal bleeding.   Remisin a Dermatologa para bultos en la piel. Informe al departamento de emergencias por sangrado vaginal.  Hemorragia vaginal durante el embarazo (primer trimestre) Vaginal Bleeding During Pregnancy, First Trimester  Durante los primeros meses del [place], es comn tener una pequea hemorragia vaginal [place]). A veces, la hemorragia es normal y no causa problemas. En otras ocasiones, sin embargo, la hemorragia puede ser una seal de algo grave. Informe a su mdico de inmediato si tiene algn tipo de hemorragia vaginal. Siga estas indicaciones en su casa: Actividad  Siga las indicaciones de su mdico con respecto al grado de actividad que puede Optometrist.  De ser necesario, organcese para que alguien la ayude con las actividades habituales.  No tenga relaciones sexuales ni orgasmos hasta que el mdico le diga que es seguro. Instrucciones generales  [employer] de venta libre y los recetados solamente como se lo haya indicado el mdico.  Controle su afeccin para Actuary cambio.  Escriba los siguientes datos: ? La cantidad de toallas higinicas que [place] cada da. ? La frecuencia con la que se cambia las toallas higinicas. ? Qu tan empapadas (saturadas) estn.  No use tampones.  No se haga duchas vaginales.  Si elimina tejido por la vagina, gurdelo para mostrrselo al [employer].  Concurra a todas las visitas de seguimiento como se lo haya indicado el mdico. Esto es importante. Comunquese con un mdico si:  Tiene una hemorragia vaginal durante el embarazo.  Tiene clicos.  Tiene fiebre. Solicite ayuda de inmediato si:  Siente clicos muy intensos en la espalda o en el vientre (abdomen).  Elimina cogulos grandes o mucho tejido por la vagina.  La hemorragia empeora.  Siente que va a desvanecerse.  Se siente dbil.  Pierde el conocimiento (se  desmaya).  Tiene escalofros.  Tiene una prdida de lquido por la vagina.  Tiene una prdida de lquido abundante por la vagina. Resumen  A veces, la hemorragia vaginal durante el embarazo es normal y no causa problemas. En otras ocasiones, la hemorragia puede ser [employer] de algo grave.  Informe a su mdico de inmediato si tiene algn tipo de hemorragia vaginal.  Siga las indicaciones de su mdico con respecto al grado de actividad que puede Optometrist. Quiz necesite que alguien la ayude con las actividades habituales. Esta informacin no tiene Marine scientist el consejo del mdico. Asegrese de hacerle al mdico cualquier pregunta que tenga. Document Revised: 04/11/2017 Document Reviewed: 04/11/2017 Elsevier Patient Education  [place].

## 2020-04-21 NOTE — Progress Notes (Signed)
Virtual Visit via Telephone Note  I connected with Regina Shea, on 04/21/2020 at 3:24 PM by telephone due to the COVID-19 pandemic and verified that I am speaking with the correct person using two identifiers.  Due to current restrictions/limitations of in-office visits due to the COVID-19 pandemic, this scheduled clinical appointment was converted to a telehealth visit.   Consent: I discussed the limitations, risks, security and privacy concerns of performing an evaluation and management service by telephone and the availability of in person appointments. I also discussed with the patient that there may be a patient responsible charge related to this service. The patient expressed understanding and agreed to proceed.  Location of Patient: Home  Location of Provider: [place]  Persons participating in Telemedicine visit: [place], NP Orlan Leavens, [place] Interpreter Name: Carron Curie WY#:[id number]  History of Present Illness: Regina Shea is a 39 year old female with history of dysmenorrhea and dysfunctional uterine bleeding who presents for skin bumps and vaginal bleeding.  1. BUMPS: Duration:  chronic  Location: legs and arms Itching: no Burning: no Redness: no Oozing: no Scaling: no Blisters: no Painful: yes Reports she had some of the bumps, which she describes as balls of grease, removed around 5 years ago by a doctor in her home country but the bumps have continued to get worse.  2. VAGINAL BLEEDING: Patient reports she was seen at the [facility] emergency department 3 days ago for vaginal bleeding. Reports at that time she was told that she was pregnant and the baby was too small to be seen. Reports she was told to return on October 9th for an ultrasound. Reports after being discharged from the hospital she did not have any vaginal bleeding for 2 days. Today reports vaginal bleeding has resumed. Described  bleeding as light flow and present with wiping only. Reports she is concerned because she had a miscarriage at [redacted] weeks pregnant in August 2021 and concerned that this may be happening again.   Past Medical History:  Diagnosis Date  . Asthma    h/o no inhalers  . Chest pain 01/20/2019  . Fistula-in-ano   . Headache    migraines   No Known Allergies  No current outpatient medications on file prior to visit.   No current facility-administered medications on file prior to visit.    Observations/Objective: Alert and oriented x 3. Not in acute distress. Physical examination not completed as this is a telemedicine visit.  Assessment and Plan: 1. Bumps on skin: - Chronic skin bumps on bilateral legs and arms. - Referral to Dermatology for further evaluation and management. - Follow-up with primary provider as needed. - Ambulatory referral to Dermatology  2. Vaginal bleeding in pregnancy, first trimester: - Counseled patient to return to the [facility] emergency department or [facility] emergency department for further evaluation and management in order to assess potential serious complications. Patient verbalized understanding.  3. Language barrier: - [employer] participated during today's visit. Interpreter Name: Carron Curie IF#:[id number]   Follow Up Instructions: Follow-up with primary provider as needed.  Patient was given clear instructions to go to Emergency Department or return to medical center if symptoms don't improve, worsen, or new problems develop.The patient verbalized understanding.  I discussed the assessment and treatment plan with the patient. The patient was provided an opportunity to ask questions and all were answered. The patient agreed with the plan and demonstrated an understanding of the instructions.  The patient was advised to call back or seek an in-person evaluation if the symptoms worsen or if the condition fails  to improve as anticipated.    I provided 26 minutes total of non-face-to-face time during this encounter including median intraservice time, reviewing previous notes, labs, imaging, medications, management and patient verbalized understanding.    Camillia Herter, NP  [facility] and [facility] [place], [place]   04/21/2020, 8:21 AM

## 2020-04-21 NOTE — Progress Notes (Signed)
   [name] presents to [facility] for follow-up quant hCG blood draw today. She was seen in [facility]U for vaginal bleeding on 04/18/20. Patient denies/endorses abdominal pain or bleeding today. Discussed with patient, we are following hCG levels today. Results will be back in approximately 2-4 hours. Valid contact number for patient confirmed. I will call the patient with results.   Results and patient history reviewed with Laury Deep, CNM. Patient called and informed of plan for follow-up. Ultrasound <14 week transvaginal to be repeated. Once ultrasound completed patient to return to [facility] to start care.   Derl Barrow 04/21/2020 8:43 AM

## 2020-05-02 ENCOUNTER — Inpatient Hospital Stay: Admission: RE | Admit: 2020-05-02 | Payer: Self-pay | Source: Ambulatory Visit

## 2020-05-18 ENCOUNTER — Ambulatory Visit
Admission: RE | Admit: 2020-05-18 | Discharge: 2020-05-18 | Disposition: A | Discharge status: Home / Self Care | Payer: Self-pay | Source: Ambulatory Visit | Attending: Obstetrics and Gynecology | Admitting: Obstetrics and Gynecology

## 2020-05-18 ENCOUNTER — Other Ambulatory Visit: Payer: Self-pay

## 2020-05-18 DIAGNOSIS — O3680X0 Pregnancy with inconclusive fetal viability, not applicable or unspecified: Secondary | ICD-10-CM | POA: Insufficient documentation

## 2020-06-09 ENCOUNTER — Encounter: Payer: Self-pay | Admitting: *Deleted

## 2020-06-09 ENCOUNTER — Ambulatory Visit (INDEPENDENT_AMBULATORY_CARE_PROVIDER_SITE_OTHER): Payer: Self-pay | Admitting: *Deleted

## 2020-06-09 ENCOUNTER — Other Ambulatory Visit: Payer: Self-pay

## 2020-06-09 ENCOUNTER — Telehealth: Payer: Self-pay | Admitting: Nurse Practitioner

## 2020-06-09 DIAGNOSIS — O0990 Supervision of high risk pregnancy, unspecified, unspecified trimester: Secondary | ICD-10-CM | POA: Insufficient documentation

## 2020-06-09 DIAGNOSIS — Z789 Other specified health status: Secondary | ICD-10-CM

## 2020-06-09 DIAGNOSIS — O09529 Supervision of elderly multigravida, unspecified trimester: Secondary | ICD-10-CM | POA: Insufficient documentation

## 2020-06-09 NOTE — Patient Instructions (Addendum)
Transportation Resources [place] ([place]) [place], [place], [place] [address] https://www.[place].gov/departments/transportation/gdot-divisions/[place]-transit-agency-public-transportation-division     . Fixed-route bus services, including regional fare cards for PART, [place], [place], and WSTA buses.  . Reduced fare bus ID's available for Medicaid, Medicare, and "orange card" recipients.  Mar[place] Kitchen SCAT offers curb-to-curb and door-to-door bus services for people with disabilities who are unable to use a fixed-bus route; also offers a shared-ride program.   Helpful tips:  -Routes available online and physical maps available at the main bus hub lobby (each for a specific route) -Smartphone directions often include bus routes (see the "bus" icon, next to the "car" and "walk" icons) -Routes differ on weekends, evenings and holidays, so plan ahead!  -If you have Medicaid, Medicare, or orange card, plan to obtain a reduced-fare ID to save 50% on rides. Check days and times to obtain an ID, and bring all necessary documents.   [employer] [place]) [place], [place]. [address], [place], [place] [address] [phone] http://www.smith-bell.org/ **Fixed-bus route services, and demand response bus service for older adults  Department of [facility] [address] [place], [place] [address] [phone] www.ProfileWatcher.fi **Medicaid transportation is available to [facility] recipients who need assistance getting to [facility] medical appointments and providers  [facility] [address] [place] [address], [place], [place] [address] www.cjmedicaltransportation.com  ** Offers non-emergency transportation for medical appointments  Wheels [place], [place], [place] [address] [phone] www.wheels4hope.org **REFERRAL NEEDED by specific agencies (see website), after meeting specified criteria only  [employer] for [employer]) [address] [place], [place] [address] [phone]  BikerFestival.is  *Regional fixed-bus routes between counties (example: [place] to [place]) and [employer] of [place], [place], [place] [address] [phone] Http://senior-resources-[place]il[place].org  Production assistant, radio available (call or see website for details)  [place] [address] [place], [place] [address] [phone] www.senioradults.org  **[place], Disability and [place] [address] [place], [place], [place] [address] [phone] www.adtsrc.[place] Department of Health and [facility] [place], [place], [place] [address] [phone]  http://goodwin-walker.biz/  **Transportation expense assistance  Department of [place] [address] [place], [place],  [address] [phone] www.co.[place]..us/pview.aspx?id=[email or web address]&catid=407  **Medicaid transportation for recipients who need assistance getting to [facility] medical appointments and providers    At [facility], we work as an integrated team, providing care to address both physical and emotional health. Your medical provider may refer you to see our [facility]) on the same day you see your medical provider, as availability permits.  Our [facility] is available to all patients, visits generally last between 20-30 minutes, but can be longer or shorter, depending on patient need. The [facility] offers help with stress management, coping with symptoms of depression and anxiety, major life changes , sleep  issues, changing risky behavior, grief and loss, life stress, working on personal life goals, and  behavioral health issues, as these all affect your overall health and wellness.  The [facility] is NOT available for the following: FMLA paperwork, court-ordered evaluations, specialty assessments (custody or disability), letters to employers, or obtaining certification for an emotional support animal. The [facility] does not provide long-term therapy. You have the right to refuse integrated behavioral health services, or to  reschedule to see the [facility] at a later date.  Exception: If you are having thoughts of suicide, we require that you either see the [facility] for further assessment, or contract for safety with your medical provider. Confidentiality exception: If it is suspected that a child or disabled adult is being abused or neglected, we are required by law to report that to either Child Protective Services or Adult Scientist, forensic.  If you have a diagnosis of Bipolar affective disorder, Schizophrenia, or recurrent Major depressive disorder, we will recommend that you establish care with a psychiatrist, as these are lifelong, chronic conditions, and we want your overall emotional health and medications to be more closely monitored. If you anticipate needing extended maternity leave due to mental illness, it it recommended you inform your medical provider, so we can put in a referral to a  psychiatrist as soon as possible. The [facility] is unable to recommend an extended maternity leave for mental health issues. Your medical provider or [facility] may refer you to a therapist for ongoing, traditional therapy, or to a psychiatrist, for medication management, if it would benefit your overall health. Depending on your insurance, you may have a copay to see the [facility]. If you are uninsured, it is recommended that you apply for financial assistance. (Forms may be requested at the front desk for in-person visits, via MyChart, or request a form  during a virtual visit).  If you see the [facility] more than 6 times, you will have to complete a comprehensive clinical assessment interview with the [facility] to resume integrated services.  For virtual visits with the [facility], you must be physically in the state of [place] at the time of the visit. For example, if you live in [place], you will have to do an in-person visit with the [facility]. If you are going out of the state or country for any reason, the [facility] may see you virtually when you return to [place], but not while you are physically outside of [place].     If you are in need of transportation to get to and from your appointments in our office.  You can reach Transportation Services by calling [phone] Monday - Friday  7am-6pm.

## 2020-06-09 NOTE — Progress Notes (Signed)
New OB Intake Patient seen in the office today for visit.   I explained I am completing New OB Intake today. We discussed her EDD of 12/15/20 that is based on US done 05/18/20  Due to recent miscarriage 02/2020. She states she had spotting  of 2 days 03/18/20 and  heavier bleeding 04/17/20 but only 1 day.- seen [facility] then [facility] on 04/18/20. Marland Kitchen Pt is [id number]/F1102. I reviewed her allergies, medications, Medical/Surgical/OB history, and appropriate screenings. I informed her of [facility] services. Based on history, this is a/an uncomplicated pregnancy.  Concerns addressed today  MyChart/Babyscripts N/A since Spanish speaking   Blood Pressure Cuff N/A since will have all visits in office.    Anatomy US Explained first scheduled [place] will be around 19 weeks. Anatomy US scheduled for 07/21/20 at 0830. Pt notified to arrive at [place].  Labs Discussed Johnsie Cancel genetic screening with patient. Would like both Panorama and Horizon drawn today with  Routine prenatal labs.   Education: Given new Patient packet.   Medications: Only taking Folic acid. Advised to also take prenatal vitamins.   COVID vaccine: Has has x2.   Social Determinants Does have transportation issues. Signed release today. Referral sent. Given phone number and other resources in AVS. Denies food issues .   WIC:   Offered referral which she accepted. Referral sent.   First visit review I reviewed new OB appt with pt. I explained she will have a pelvic exam, ob bloodwork with genetic screening, and PAP smear. Explained pt will be seen by Ardean Larsen, CNM at first visit; encounter routed to appropriate provider.  Arietta Eisenstein,RN 06/09/2020  8:26 AM

## 2020-06-09 NOTE — Telephone Encounter (Signed)
   Regina Shea DOB: [birth year] MRN: [id number]   RIDER WAIVER AND RELEASE OF LIABILITY  For purposes of improving physical access to our facilities, [place] is pleased to partner with third parties to provide [place] patients or other authorized individuals the option of convenient, on-demand ground transportation services (the [place]") through use of the technology service that enables users to request on-demand ground transportation from independent third-party providers.  By opting to use and accept these [employer], I, the undersigned, hereby agree on behalf of myself, and on behalf of any minor child using the [employer] for whom I am the parent or legal guardian, as follows:  1. Government social research officer provided to me are provided by independent third-party transportation providers who are not [employer] or employees and who are unaffiliated with [employer]. 2. [place] is neither a transportation carrier nor a common or public carrier. 3. [place] has no control over the quality or safety of the transportation that occurs as a result of the [employer]. 4. [place] cannot guarantee that any third-party transportation provider will complete any arranged transportation service. 5. [place] makes no representation, warranty, or guarantee regarding the reliability, timeliness, quality, safety, suitability, or availability of any of the Transport Services or that they will be error free. 6. I fully understand that traveling by vehicle involves risks and dangers of serious bodily injury, including permanent disability, paralysis, and death. I agree, on behalf of myself and on behalf of any minor child using the Transport Services for whom I am the parent or legal guardian, that the entire risk arising out of my use of the [employer] remains solely with me, to the maximum extent permitted under applicable law. 7. The  [employer] are provided "as is" and "as available." [place] disclaims all representations and warranties, express, implied or statutory, not expressly set out in these terms, including the implied warranties of merchantability and fitness for a particular purpose. 8. I hereby waive and release [place], its agents, employees, officers, directors, representatives, insurers, attorneys, assigns, successors, subsidiaries, and affiliates from any and all past, present, or future claims, demands, liabilities, actions, causes of action, or suits of any kind directly or indirectly arising from acceptance and use of the [employer]. 9. I further waive and release [place] and its affiliates from all present and future liability and responsibility for any injury or death to persons or damages to property caused by or related to the use of the [employer]. 10. I have read this Waiver and Release of Liability, and I understand the terms used in it and their legal significance. This Waiver is freely and voluntarily given with the understanding that my right (as well as the right of any minor child for whom I am the parent or legal guardian using the [employer]) to legal recourse against [place] in connection with the [employer] is knowingly surrendered in return for use of these services.   I attest that I read the consent document to Regina Shea, gave [name] the opportunity to ask questions and answered the questions asked (if any). I affirm that Regina Shea then provided consent for she's participation in this program.     Legrand Pitts, read to patient in [place]

## 2020-06-10 LAB — CBC/D/PLT+RPR+RH+ABO+RUB AB...
Antibody Screen: NEGATIVE
Basophils Absolute: 0 10*3/uL (ref 0.0–0.2)
Basos: 0 %
EOS (ABSOLUTE): 0.1 10*3/uL (ref 0.0–0.4)
Eos: 1 %
HCV Ab: <0.1 s/co ratio (ref 0.0–0.9)
HIV Screen 4th Generation wRfx: NONREACTIVE
Hematocrit: 42.1 % (ref 34.0–46.6)
Hemoglobin: 13.8 g/dL (ref 11.1–15.9)
Hepatitis B Surface Ag: NEGATIVE
Immature Grans (Abs): 0 10*3/uL (ref 0.0–0.1)
Immature Granulocytes: 0 %
Lymphocytes Absolute: 2.6 10*3/uL (ref 0.7–3.1)
Lymphs: 27 %
MCH: 28.3 pg (ref 26.6–33.0)
MCHC: 32.8 g/dL (ref 31.5–35.7)
MCV: 86 fL (ref 79–97)
Monocytes Absolute: 0.6 10*3/uL (ref 0.1–0.9)
Monocytes: 6 %
Neutrophils Absolute: 6.3 10*3/uL (ref 1.4–7.0)
Neutrophils: 66 %
Platelets: 289 10*3/uL (ref 150–450)
RBC: 4.88 x10E6/uL (ref 3.77–5.28)
RDW: 12.8 % (ref 11.7–15.4)
RPR Ser Ql: NONREACTIVE
Rh Factor: POSITIVE
Rubella Antibodies, IGG: 3.37 index (ref >0.99)
WBC: 9.6 10*3/uL (ref 3.4–10.8)

## 2020-06-10 LAB — HCV INTERPRETATION

## 2020-06-10 LAB — HEMOGLOBIN A1C
Est. average glucose Bld gHb Est-mCnc: 114 mg/dL
Hgb A1c MFr Bld: 5.6 % (ref 4.8–5.6)

## 2020-06-15 ENCOUNTER — Ambulatory Visit (INDEPENDENT_AMBULATORY_CARE_PROVIDER_SITE_OTHER): Payer: Self-pay | Admitting: Student

## 2020-06-15 ENCOUNTER — Other Ambulatory Visit: Payer: Self-pay

## 2020-06-15 ENCOUNTER — Encounter: Payer: Self-pay | Admitting: Student

## 2020-06-15 VITALS — BP 122/79 | HR 101 | Wt 185.7 lb

## 2020-06-15 DIAGNOSIS — O0990 Supervision of high risk pregnancy, unspecified, unspecified trimester: Secondary | ICD-10-CM

## 2020-06-15 DIAGNOSIS — Z23 Encounter for immunization: Secondary | ICD-10-CM

## 2020-06-15 DIAGNOSIS — Z3A13 13 weeks gestation of pregnancy: Secondary | ICD-10-CM

## 2020-06-15 NOTE — Progress Notes (Signed)
Chart reviewed for nurse visit. Agree with plan of care.   Regina Shea, [place] 06/15/2020 5:37 PM

## 2020-06-15 NOTE — Progress Notes (Signed)
  Subjective:    Regina Shea is being seen today for her first obstetrical visit.  This is a planned pregnancy. She is at [redacted]w[redacted]d gestation. Her obstetrical history is significant for nothing. . Relationship with FOB: spouse, living together. Patient does intend to breast feed. Pregnancy history fully reviewed. All term deliveries, no C/sections, no history of   Patient reports headache that comes and goes. it is related to her stress. She never had migraines during pregnancy but she feels like she is getting them more now. she has not tried anything for them. .  Review of Systems:   Review of Systems  Constitutional: Negative.   HENT: Negative.   Respiratory: Negative.   Musculoskeletal: Negative.   Neurological: Negative.     Objective:     BP 122/79   Pulse (!) 101   Wt 185 lb 11.2 oz (84.2 kg)   LMP 04/17/2020   BMI 35.06 kg/m  Physical Exam Constitutional:      Appearance: Normal appearance.  HENT:     Head: Normocephalic.  Musculoskeletal:        General: Normal range of motion.  Skin:    General: Skin is warm.  Neurological:     General: No focal deficit present.     Mental Status: She is alert.  Psychiatric:        Mood and Affect: Mood normal.     Exam    Assessment:    Pregnancy: [id number] Patient Active Problem List   Diagnosis Date Noted  . Supervision of high risk pregnancy, antepartum 06/09/2020  . AMA (advanced maternal age) multigravida 35+ 06/09/2020  . Language barrier 06/09/2020  . Dysmenorrhea 07/16/2019  . DUB (dysfunctional uterine bleeding) 07/16/2019       Plan:     Initial labs drawn. Prenatal vitamins. Problem list reviewed and updated. AFP3 discussed: too early. Role of ultrasound in pregnancy discussed; fetal survey: ordered and scheduled. Amniocentesis discussed: not indicated. Follow up in 4 weeks. 75% of 30 min visit spent on counseling and coordination of care.  -Message sent to [facility] to schedule patient to see  provider to discuss headache options, recommended ibuprofen, tylenol.  -Genetic testing performed; results pending -All questions answered, welcomed patient to practice and explained role of learners, etc.  Mervyn Skeeters [facility] 06/15/2020

## 2020-06-24 LAB — AFP, SERUM, OPEN SPINA BIFIDA
AFP Value: 18.4 ng/mL
Gest. Age on Collection Date: 13.6 weeks
Maternal Age At EDD: 39.7 yr
Weight: 186 [lb_av]

## 2020-06-30 ENCOUNTER — Encounter: Payer: Self-pay | Admitting: *Deleted

## 2020-07-13 ENCOUNTER — Other Ambulatory Visit: Payer: Self-pay | Admitting: Student

## 2020-07-13 ENCOUNTER — Ambulatory Visit (INDEPENDENT_AMBULATORY_CARE_PROVIDER_SITE_OTHER): Payer: Self-pay | Admitting: Student

## 2020-07-13 ENCOUNTER — Other Ambulatory Visit: Payer: Self-pay

## 2020-07-13 ENCOUNTER — Other Ambulatory Visit (HOSPITAL_COMMUNITY)
Admission: RE | Admit: 2020-07-13 | Discharge: 2020-07-13 | Disposition: A | Discharge status: Home / Self Care | Payer: Self-pay | Source: Ambulatory Visit | Attending: Student | Admitting: Student

## 2020-07-13 VITALS — BP 118/69 | HR 79 | Wt 189.0 lb

## 2020-07-13 DIAGNOSIS — N898 Other specified noninflammatory disorders of vagina: Secondary | ICD-10-CM

## 2020-07-13 DIAGNOSIS — O26892 Other specified pregnancy related conditions, second trimester: Secondary | ICD-10-CM

## 2020-07-13 DIAGNOSIS — Z3A18 18 weeks gestation of pregnancy: Secondary | ICD-10-CM

## 2020-07-13 DIAGNOSIS — O0990 Supervision of high risk pregnancy, unspecified, unspecified trimester: Secondary | ICD-10-CM

## 2020-07-13 MED ORDER — TERCONAZOLE 0.4 % VA CREA: 1.0000 | TOPICAL_CREAM | Freq: Every day | VAGINAL | 0 refills | Status: DC

## 2020-07-13 MED FILL — TERCONAZOLE 0.4% VAG CREAM: 0.4 | 7 days supply | Qty: 45 | Fill #0

## 2020-07-13 NOTE — Progress Notes (Signed)
° °  PRENATAL VISIT NOTE  Subjective:  Regina Shea is a 39 y.o. [id number] at [redacted]w[redacted]d being seen today for ongoing prenatal care.  She is currently monitored for the following issues for this low-risk pregnancy and has Dysmenorrhea; DUB (dysfunctional uterine bleeding); Supervision of high risk pregnancy, antepartum; AMA (advanced maternal age) multigravida 35+; and Language barrier on their problem list.  Patient reports vaginal irritation and some itching. She reports some clear discharge.  Contractions: Not present. Vag. Bleeding: None.   . Denies leaking of fluid.   The following portions of the patient's history were reviewed and updated as appropriate: allergies, current medications, past family history, past medical history, past social history, past surgical history and problem list.   Objective:   Vitals:   07/13/20 0841  BP: 118/69  Pulse: 79  Weight: 189 lb (85.7 kg)    Fetal Status: Fetal Heart Rate (bpm): 146         General:  Alert, oriented and cooperative. Patient is in no acute distress.  Skin: Skin is warm and dry. No rash noted.   Cardiovascular: Normal heart rate noted  Respiratory: Normal respiratory effort, no problems with respiration noted  Abdomen: Soft, gravid, appropriate for gestational age.  Pain/Pressure: Absent     Pelvic: Cervical exam deferred      White, clumpy discharge in the vagina with reddened vaginal walls  Extremities: Normal range of motion.  Edema: None  Mental Status: Normal mood and affect. Normal behavior. Normal judgment and thought content.   Assessment and Plan:  Pregnancy: [id number] at [redacted]w[redacted]d 1. Vaginal discharge during pregnancy in second trimester - Cervicovaginal ancillary only( [place]) -Rx for [employer] given 2. Supervision of high risk pregnancy, antepartum  - AFP, Serum, Open Spina Bifida  Preterm labor symptoms and general obstetric precautions including but not limited to vaginal bleeding, contractions, leaking of  fluid and fetal movement were reviewed in detail with the patient. Please refer to After Visit Summary for other counseling recommendations.   Return in about 4 weeks (around 08/10/2020), or LROB with [place].  Future Appointments  Date Time Provider [place]  07/21/2020  8:15 AM [facility] NURSE [facility]  07/21/2020  8:30 AM [facility]    [place], CNM

## 2020-07-14 LAB — CERVICOVAGINAL ANCILLARY ONLY
Bacterial Vaginitis (gardnerella): POSITIVE — AB
Candida Glabrata: NEGATIVE
Candida Vaginitis: POSITIVE — AB
Chlamydia: NEGATIVE
Comment: NEGATIVE
Comment: NEGATIVE
Comment: NEGATIVE
Comment: NEGATIVE
Comment: NEGATIVE
Comment: NORMAL
Neisseria Gonorrhea: NEGATIVE
Trichomonas: NEGATIVE

## 2020-07-15 LAB — AFP, SERUM, OPEN SPINA BIFIDA
AFP MoM: 1.27
AFP Value: 47.5 ng/mL
Gest. Age on Collection Date: 17.9 weeks
Maternal Age At EDD: 39.7 yr
OSBR Risk 1 IN: 5251
Test Results:: NEGATIVE
Weight: 189 [lb_av]

## 2020-07-21 ENCOUNTER — Other Ambulatory Visit: Payer: Self-pay | Admitting: *Deleted

## 2020-07-21 ENCOUNTER — Ambulatory Visit: Payer: Self-pay | Admitting: *Deleted

## 2020-07-21 ENCOUNTER — Other Ambulatory Visit: Payer: Self-pay

## 2020-07-21 ENCOUNTER — Encounter: Payer: Self-pay | Admitting: *Deleted

## 2020-07-21 ENCOUNTER — Ambulatory Visit: Payer: Self-pay | Attending: Student

## 2020-07-21 DIAGNOSIS — R6252 Short stature (child): Secondary | ICD-10-CM

## 2020-07-21 DIAGNOSIS — O0990 Supervision of high risk pregnancy, unspecified, unspecified trimester: Secondary | ICD-10-CM

## 2020-07-21 DIAGNOSIS — Z758 Other problems related to medical facilities and other health care: Secondary | ICD-10-CM

## 2020-07-21 DIAGNOSIS — Z789 Other specified health status: Secondary | ICD-10-CM

## 2020-07-21 DIAGNOSIS — O09529 Supervision of elderly multigravida, unspecified trimester: Secondary | ICD-10-CM

## 2020-07-30 DIAGNOSIS — D179 Benign lipomatous neoplasm, unspecified: Secondary | ICD-10-CM

## 2020-07-30 HISTORY — DX: Benign lipomatous neoplasm, unspecified: D17.9

## 2020-07-30 NOTE — L&D Delivery Note (Signed)
Delivery Note At 5:07 PM a viable and healthy female was delivered via Vaginal, Spontaneous (Presentation: Left Occiput Anterior).  APGAR: 8, 9; weight pending.  Shoulders delivered easily. Baby placed skin-to-skin w/ Mom. Delayed cord clamping. Cord clamped x 2  And cut by FOB.  Placenta status: Spontaneous, Intact, accessory lobe.  Cord: 3 vessels with the following complications: None.  Cord pH: NA  Anesthesia: Epidural Episiotomy: None Lacerations: None Suture Repair: NA Est. Blood Loss (mL): 50  Mom to postpartum.  Baby to Couplet care / Skin to Skin. Placenta to: LD Feeding: Br/Bo Circ: No Contraception: Depo->Vasectomy.   [name] 12/11/2020, 5:40 PM

## 2020-08-10 ENCOUNTER — Encounter: Payer: Self-pay | Admitting: Advanced Practice Midwife

## 2020-08-10 ENCOUNTER — Other Ambulatory Visit: Payer: Self-pay

## 2020-08-10 ENCOUNTER — Ambulatory Visit (INDEPENDENT_AMBULATORY_CARE_PROVIDER_SITE_OTHER): Payer: Self-pay | Admitting: Advanced Practice Midwife

## 2020-08-10 VITALS — BP 127/81 | HR 86 | Wt 197.5 lb

## 2020-08-10 DIAGNOSIS — Z3A21 21 weeks gestation of pregnancy: Secondary | ICD-10-CM

## 2020-08-10 DIAGNOSIS — O0990 Supervision of high risk pregnancy, unspecified, unspecified trimester: Secondary | ICD-10-CM

## 2020-08-10 NOTE — Progress Notes (Signed)
   PRENATAL VISIT NOTE  Subjective:  Regina Shea is a 40 y.o. [id number] at [redacted]w[redacted]d being seen today for ongoing prenatal care.  She is currently monitored for the following issues for this high-risk pregnancy and has Dysmenorrhea; DUB (dysfunctional uterine bleeding); Supervision of high risk pregnancy, antepartum; AMA (advanced maternal age) multigravida 35+; and Language barrier on their problem list.  Patient reports round ligament pain .  Contractions: Irritability. Vag. Bleeding: None.  Movement: Present. Denies leaking of fluid.   The following portions of the patient's history were reviewed and updated as appropriate: allergies, current medications, past family history, past medical history, past social history, past surgical history and problem list.   Objective:   Vitals:   08/10/20 0829  BP: 127/81  Pulse: 86  Weight: 197 lb 8 oz (89.6 kg)    Fetal Status: Fetal Heart Rate (bpm): 140   Movement: Present     General:  Alert, oriented and cooperative. Patient is in no acute distress.  Skin: Skin is warm and dry. No rash noted.   Cardiovascular: Normal heart rate noted  Respiratory: Normal respiratory effort, no problems with respiration noted  Abdomen: Soft, gravid, appropriate for gestational age.  Pain/Pressure: Present     Pelvic: Cervical exam deferred        Extremities: Normal range of motion.  Edema: None  Mental Status: Normal mood and affect. Normal behavior. Normal judgment and thought content.   Assessment and Plan:  Pregnancy: [id number] at [redacted]w[redacted]d 1. Supervision of high risk pregnancy, antepartum - AMA, not > 13 years old   2. [redacted] weeks gestation of pregnancy - Round ligament pain. Comfort measures reviewed   Preterm labor symptoms and general obstetric precautions including but not limited to vaginal bleeding, contractions, leaking of fluid and fetal movement were reviewed in detail with the patient. Please refer to After Visit Summary for other  counseling recommendations.   Return in about 4 weeks (around 09/07/2020) for virtual visit .  Future Appointments  Date Time Provider [place]  09/01/2020  7:45 AM [facility] NURSE [facility]  09/01/2020  8:00 AM [facility] US1 [facility] [place] DNP, CNM  08/10/20  8:37 AM

## 2020-09-01 ENCOUNTER — Ambulatory Visit: Payer: Self-pay | Admitting: *Deleted

## 2020-09-01 ENCOUNTER — Other Ambulatory Visit: Payer: Self-pay | Admitting: *Deleted

## 2020-09-01 ENCOUNTER — Other Ambulatory Visit: Payer: Self-pay

## 2020-09-01 ENCOUNTER — Ambulatory Visit: Payer: Self-pay | Attending: Obstetrics and Gynecology

## 2020-09-01 ENCOUNTER — Encounter: Payer: Self-pay | Admitting: *Deleted

## 2020-09-01 DIAGNOSIS — Z3A25 25 weeks gestation of pregnancy: Secondary | ICD-10-CM

## 2020-09-01 DIAGNOSIS — O09522 Supervision of elderly multigravida, second trimester: Secondary | ICD-10-CM

## 2020-09-01 DIAGNOSIS — O0990 Supervision of high risk pregnancy, unspecified, unspecified trimester: Secondary | ICD-10-CM | POA: Insufficient documentation

## 2020-09-01 DIAGNOSIS — Z789 Other specified health status: Secondary | ICD-10-CM | POA: Insufficient documentation

## 2020-09-01 DIAGNOSIS — R6252 Short stature (child): Secondary | ICD-10-CM | POA: Insufficient documentation

## 2020-09-07 ENCOUNTER — Telehealth (INDEPENDENT_AMBULATORY_CARE_PROVIDER_SITE_OTHER): Payer: Self-pay | Admitting: Advanced Practice Midwife

## 2020-09-07 DIAGNOSIS — Z91199 Patient's noncompliance with other medical treatment and regimen due to unspecified reason: Secondary | ICD-10-CM

## 2020-09-07 DIAGNOSIS — Z5329 Procedure and treatment not carried out because of patient's decision for other reasons: Secondary | ICD-10-CM

## 2020-09-07 NOTE — Progress Notes (Signed)
Called pt at 1119 with interpreter [name]. Explained to pt we will need to reschedule appt today due to her being unavailable at appt time. Pt agreeable; does not have any questions or concerns at this time. Pt requests to be rescheduled with Maye Hides, CNM. Pt states she tested positive for COVID on 1/13/22l; reports continuing to improve.

## 2020-09-07 NOTE — Progress Notes (Signed)
Patient did not answer calls until well after appt time. Will reschedule appt.   Marcille Buffy DNP, CNM  09/07/20  11:24 AM

## 2020-09-07 NOTE — Progress Notes (Signed)
1100-- attempted to call patient with Regina Shea for interpreter no answer- unable to leave message as voicemail was not set up. Called patient's husband and he states he is almost home and will make sure the patient answers her phone

## 2020-09-27 ENCOUNTER — Encounter: Payer: Self-pay | Admitting: Obstetrics and Gynecology

## 2020-10-06 ENCOUNTER — Ambulatory Visit: Payer: Self-pay | Attending: Nurse Practitioner

## 2020-10-20 ENCOUNTER — Other Ambulatory Visit: Payer: Self-pay

## 2020-10-20 ENCOUNTER — Ambulatory Visit (INDEPENDENT_AMBULATORY_CARE_PROVIDER_SITE_OTHER): Payer: Self-pay | Admitting: Family Medicine

## 2020-10-20 DIAGNOSIS — D179 Benign lipomatous neoplasm, unspecified: Secondary | ICD-10-CM | POA: Insufficient documentation

## 2020-10-20 DIAGNOSIS — D1722 Benign lipomatous neoplasm of skin and subcutaneous tissue of left arm: Secondary | ICD-10-CM

## 2020-10-20 NOTE — Patient Instructions (Signed)
It was great seeing you today!  Today we discussed the lumps that you have noticed all over your body, we believe this is a lipoma which are completely benign and unfortunately there is no means to prevent them. These also can be hereditary. If you would like, we can remove some of these that are irritating you after you deliver. For now, I am sorry that they can be painful.   Please follow up at your next scheduled appointment, if anything arises between now and then, please don't hesitate to contact our office.   Thank you for allowing [place] to be a part of your medical care!  Thank you, Dr. Larae Grooms    Lipoma  Un lipoma es un tumor no canceroso (benigno) formado por clulas de grasa. Es un tipo muy frecuente de [employer] tejidos blandos. Por lo general, los lipomas se encuentran debajo de la piel (subcutneos). Pueden aparecer en cualquier tejido del cuerpo que contenga grasa. Las zonas en las que los lipomas aparecen con mayor frecuencia incluyen la espalda, los [place], los hombros, las nalgas y los muslos. Los lipomas crecen lentamente y, en general, son indoloros. La mayora de los lipomas no causan problemas y no requieren Clinical research associate. Cules son las causas? Se desconoce la causa de esta afeccin. Qu incrementa el riesgo? Es ms probable que sufra esta afeccin si:  Regina Shea 19 y 54aos de edad.  Tiene antecedentes familiares de lipomas. Cules son los signos o sntomas? Por lo general, el lipoma aparece como una pequea protuberancia redonda debajo de la piel. En la [employer], el bulto tiene las siguientes caractersticas:  Se siente suave o elstico.  No causa dolor ni otros sntomas. Sin embargo, si el lipoma se encuentra en un rea en la que hace presin [employer] nervios, puede causar dolor u otros sntomas. Cmo se diagnostica? Por lo general, el lipoma puede diagnosticarse con un examen fsico. Tambin pueden hacerle estudios para confirmar el  diagnstico y Paramedic otras afecciones. Las pruebas pueden incluir las siguientes:  Pruebas de diagnstico por imgenes, como una exploracin por tomografa computarizada (TC) o una resonancia magntica (RM).  Extraccin de [place] de tejido para analizar con un microscopio (biopsia). Cmo se trata? El tratamiento de esta afeccin depende del tamao del lipoma y si causa algn sntoma.  Los lipomas pequeos que no causan problemas no requieren tratamiento.  Si un lipoma se agranda o causa problemas, puede realizarse [place] para extirpar el lipoma. Los lipomas tambin pueden extirparse para mejorar el aspecto. En la [employer], [place] procedimiento se realiza despus de aplicar un medicamento que adormece el rea (anestesia local).  Pueden realizarse una liposuccin para reducir el tamao del lipoma antes de que se lo extraigan quirrgicamente, o bien se la puede realizar para extirpar el lipoma. Los lipomas se extirpan con este mtodo para limitar el tamao de la incisin y las cicatrices. Se introduce un tubo de liposuccin a travs de una pequea incisin en el lipoma y el contenido del lipoma se extrae a travs del tubo mediante succin. Siga estas indicaciones en casa:  Controle el lipoma para detectar cambios.  Concurra a todas las visitas de seguimiento como se lo haya indicado el mdico. Esto es importante. Comunquese con un mdico si:  El lipoma se agranda o se endurece.  El lipoma comienza a causarle dolor, se enrojece o se hincha cada vez ms. Estos podran ser signos de infeccin o de una afeccin ms grave.  Solicite ayuda de inmediato si:  Siente hormigueo o adormecimiento en el rea cerca del lipoma. Esto podra indicar que el lipoma es lo que causa dao nervioso. Resumen  Un lipoma es un tumor no canceroso formado por clulas de grasa.  La mayora de los lipomas no causan problemas y no requieren Clinical research associate.  Si un lipoma se agranda o causa problemas,  puede realizarse [place] para extirpar el lipoma.  Comunquese con un mdico si el lipoma se agranda o se endurece, o si empieza a dolor, se pone de color rojo o se hincha cada vez ms. El dolor, el enrojecimiento y la hinchazn podran ser signos de infeccin o de una afeccin ms grave. Esta informacin no tiene Marine scientist el consejo del mdico. Asegrese de hacerle al mdico cualquier pregunta que tenga. Document Revised: 05/12/2019 Document Reviewed: 05/12/2019 Elsevier Patient Education  [place].

## 2020-10-20 NOTE — Assessment & Plan Note (Signed)
-  likely lipoma, explained to patient that this is benign in nature. Likely not associated to pregnancy, explained that there is limited that can be done to prevent the formation of potential new lipomas and that this condition does have a genetic component -recommended to have most irritating areas removed after delivery, patient voices understand and is in agreement with current plan -f/u after delivery for possible excision

## 2020-10-20 NOTE — Progress Notes (Addendum)
    SUBJECTIVE:   CHIEF COMPLAINT / HPI:   Multiple lumps Patient endorsing multiple "lumps of fat" all over her body. She first noticed them 6 years ago after she gave birth to her first child. First noticed one along the left inguinal region and have now progressively spread to other parts of her body including thighs bilaterally, elbows bilaterally and abdomen. Noted that once they form, they never completely resolve. Endorses that she was previously seen by a physician in [place] who removed her largest one via surgical procedure and ended up removing what she was told "a ball of fat." Shares that her sister has a similar condition that also started out in a similar fashion and has spread to other parts of her body. Denies observing associated rapid growth but does endorse growth that is slowly progressive from the time the lump develops. States that she has these lumps both during pregnancy and at other times. Denies pruritus or drainage. Denies pain but endorses tenderness only upon palpation. Denies any other associated symptoms.  OBJECTIVE:   BP 118/70   Pulse 97   Ht 5\' 2"  (1.575 m)   Wt 204 lb 3.2 oz (92.6 kg)   LMP 04/17/2020   SpO2 98%   BMI 37.35 kg/m   General: Patient well-appearing, in no acute distress. Derm: skin warm and dry to touch, multiple, circumscribed, subcutaneous, well-defined areas ranging between 1-3 cm located along abdomen, extensor surfaces bilaterally and lower extremities bilaterally, no noted drainage or bleeding, no associated rash or erythematous base noted, please see picture below Neuro: normal gait Psych: mood appropriate      ASSESSMENT/PLAN:   Lipoma -likely lipoma, although dercums disease a possible differential, explained to patient that this is benign in nature. Likely not associated to pregnancy, explained that there is limited that can be done to prevent the formation of potential new lipomas and that this condition does have a genetic  component -recommended to have most irritating areas removed after delivery, patient voices understand and is in agreement with current plan -f/u after delivery for possible excision     In-person Spanish interpretation utilized throughout the entirety of this encounter.   Donney Dice, [place]

## 2020-10-23 IMAGING — US US OB TRANSVAGINAL
1 series · 15 of 28 positions shown · non-contrast
Comparison: April 18, 2020.

CLINICAL DATA: Fetal viability.

EXAM:
TRANSVAGINAL OB ULTRASOUND
TECHNIQUE: Transvaginal ultrasound was performed for complete evaluation of the
gestation as well as the maternal uterus, adnexal regions, and
pelvic cul-de-sac.

[Series 1: us ob transvaginal · 48 acquisitions, 15 frames shown]
[im 1/48]
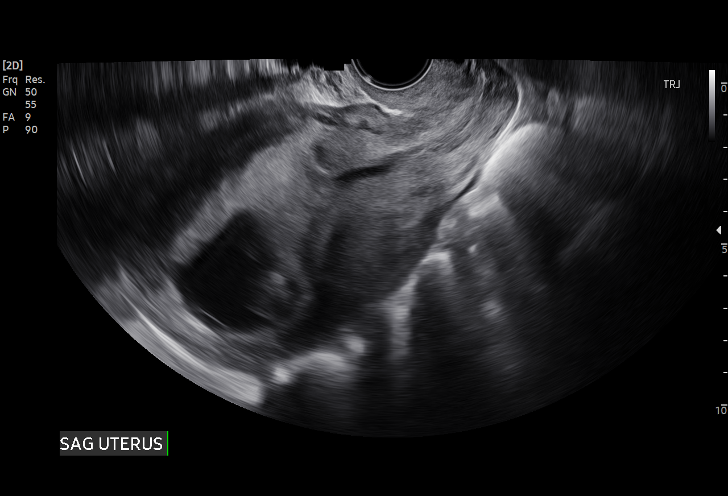
[im 4/48]
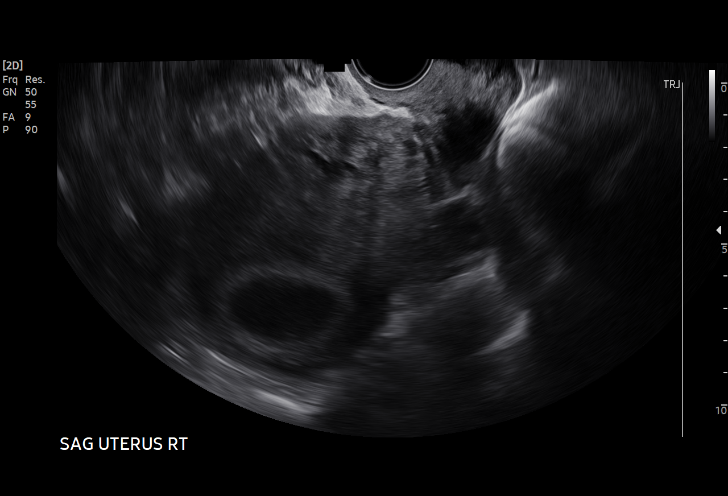
[im 7/48]
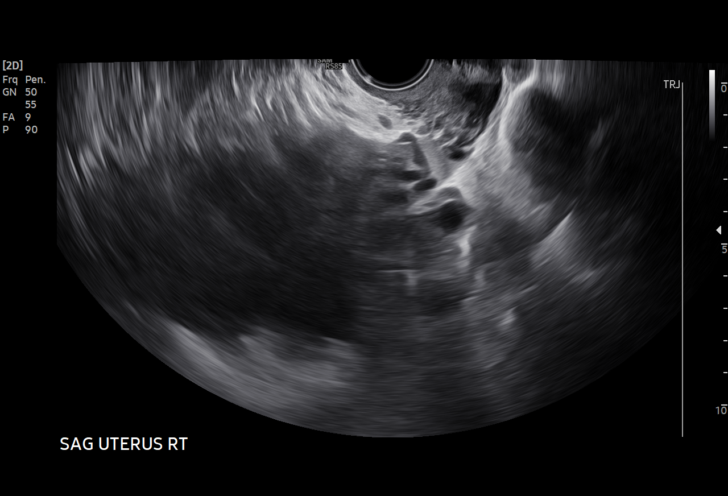
[im 11/48]
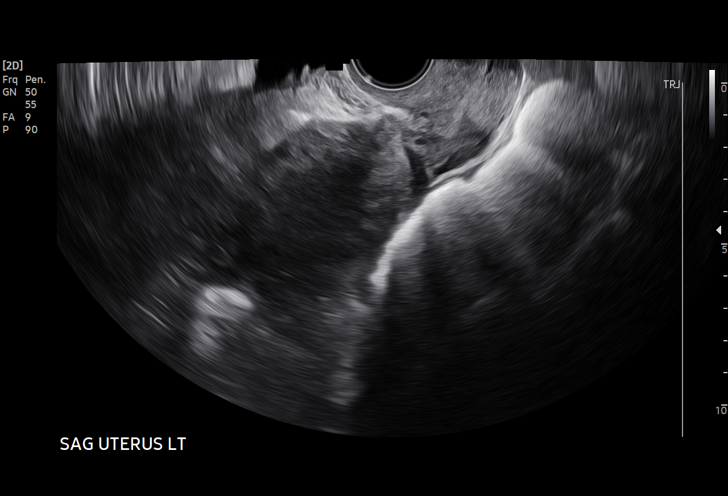
[im 14/48]
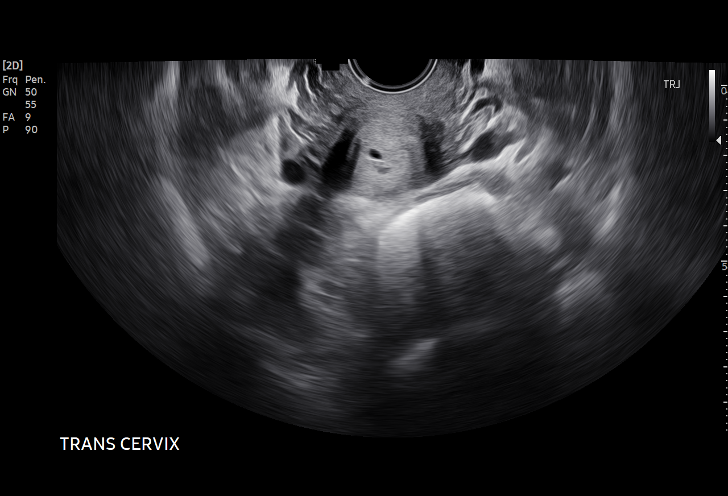
[im 18/48]
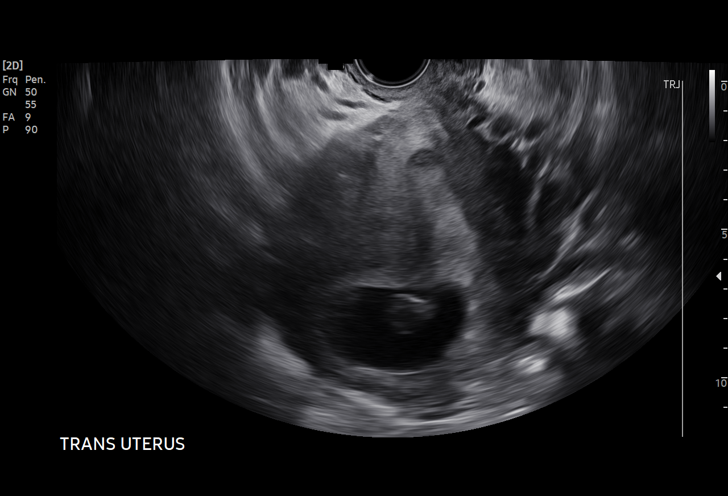
[im 21/48]
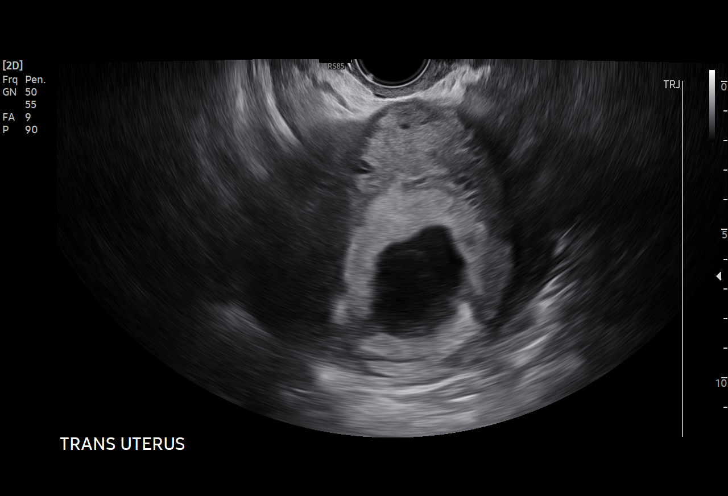
[im 25/48]
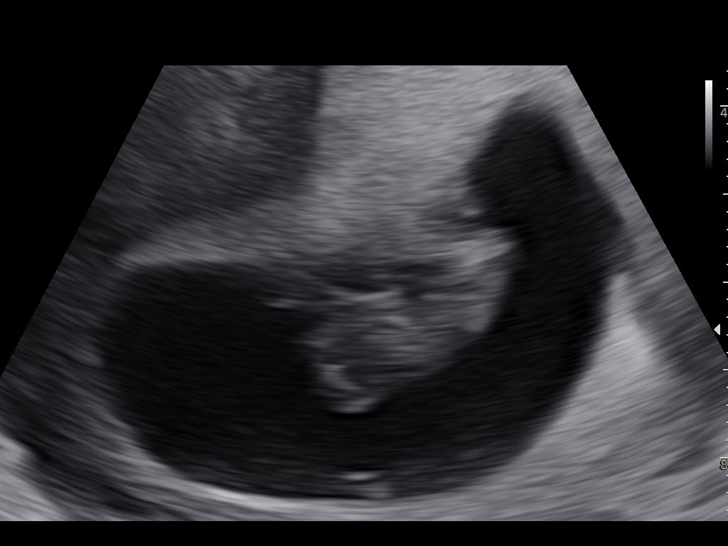
[im 27/48]
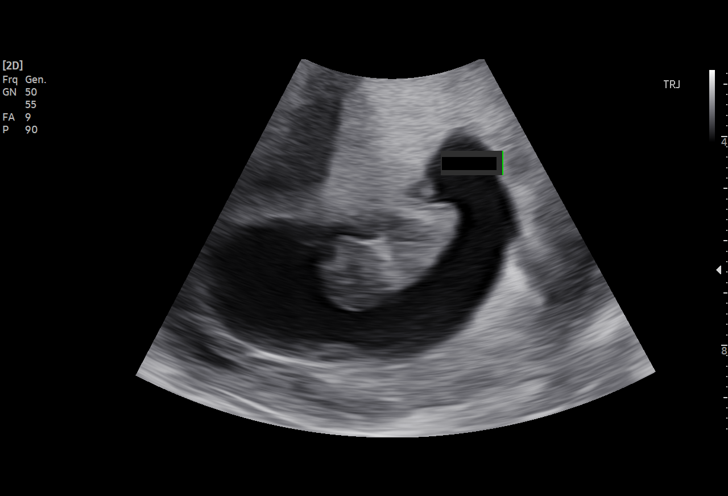
[im 30/48]
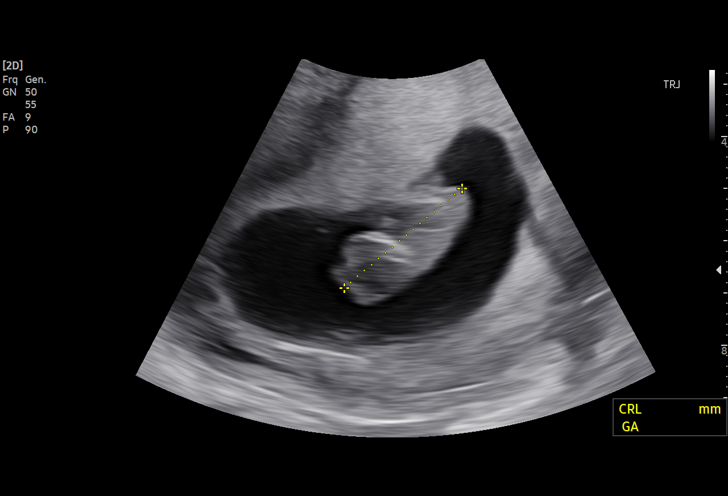
[im 34/48]
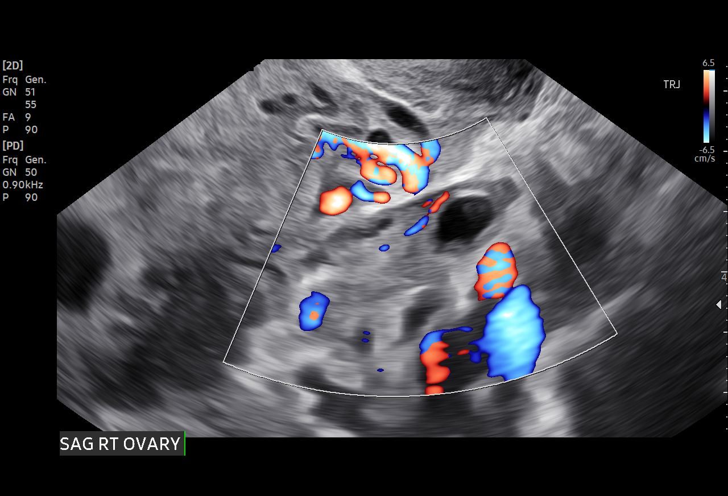
[im 37/48]
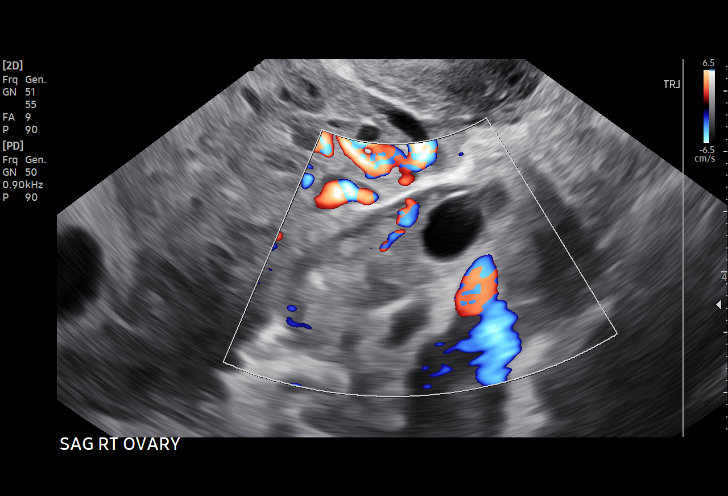
[im 41/48]
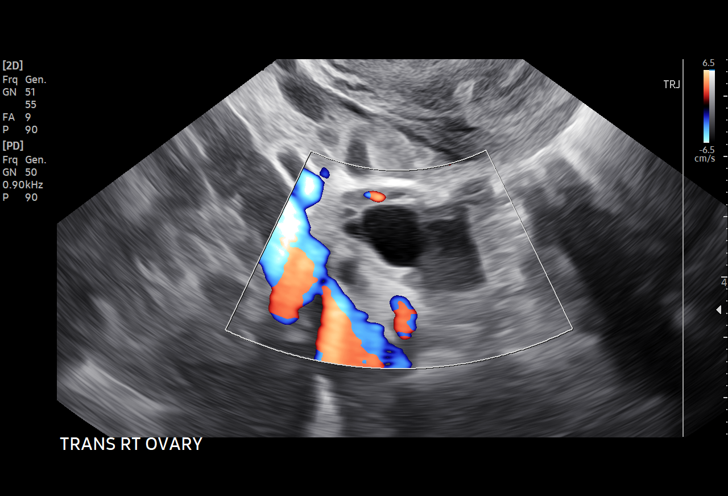
[im 44/48]
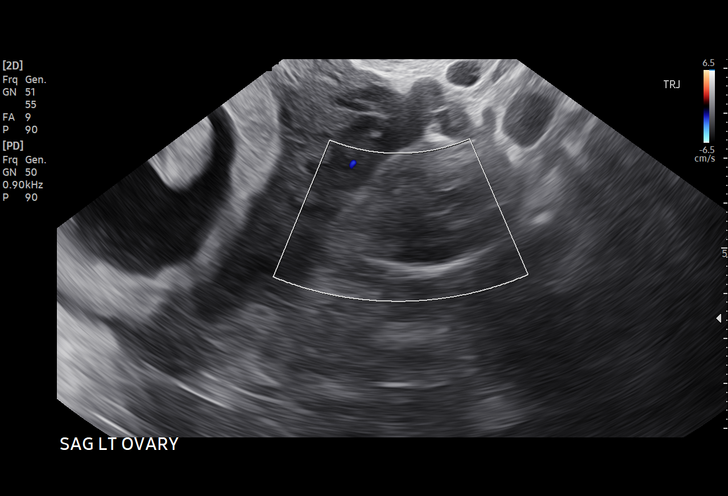
[im 48/48]
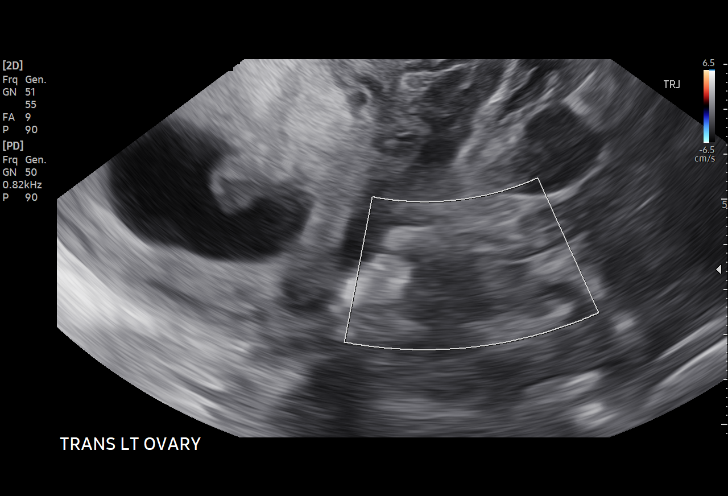

[15 of 28 positions shown; findings below may reference images not displayed]

FINDINGS: Intrauterine gestational sac: Single

Yolk sac:  Visualized.

Embryo:  Visualized.

Cardiac Activity: Visualized.

Heart Rate: 164 bpm

CRL:   29.5 mm   9 w 6 d                  US EDC: December 15, 2020.

Subchorionic hemorrhage:  None visualized.

Maternal uterus/adnexae: Probable corpus luteum cyst seen in right
ovary. Left ovary is unremarkable. No free fluid is noted.
IMPRESSION: Single live intrauterine gestation of 9 weeks 6 days.

## 2020-11-11 ENCOUNTER — Ambulatory Visit: Payer: Self-pay

## 2020-11-14 ENCOUNTER — Other Ambulatory Visit: Payer: Self-pay

## 2020-11-14 ENCOUNTER — Ambulatory Visit: Payer: Self-pay | Attending: Maternal & Fetal Medicine

## 2020-11-14 DIAGNOSIS — Z3A35 35 weeks gestation of pregnancy: Secondary | ICD-10-CM

## 2020-11-14 DIAGNOSIS — O09522 Supervision of elderly multigravida, second trimester: Secondary | ICD-10-CM | POA: Insufficient documentation

## 2020-11-14 DIAGNOSIS — O09523 Supervision of elderly multigravida, third trimester: Secondary | ICD-10-CM

## 2020-11-25 ENCOUNTER — Ambulatory Visit (INDEPENDENT_AMBULATORY_CARE_PROVIDER_SITE_OTHER): Payer: Self-pay | Admitting: Obstetrics and Gynecology

## 2020-11-25 ENCOUNTER — Other Ambulatory Visit (HOSPITAL_COMMUNITY)
Admission: RE | Admit: 2020-11-25 | Discharge: 2020-11-25 | Disposition: A | Discharge status: Home / Self Care | Payer: Self-pay | Source: Ambulatory Visit | Attending: Obstetrics and Gynecology | Admitting: Obstetrics and Gynecology

## 2020-11-25 VITALS — BP 115/87 | HR 98 | Wt 209.6 lb

## 2020-11-25 DIAGNOSIS — O0990 Supervision of high risk pregnancy, unspecified, unspecified trimester: Secondary | ICD-10-CM | POA: Insufficient documentation

## 2020-11-25 DIAGNOSIS — Z23 Encounter for immunization: Secondary | ICD-10-CM

## 2020-11-25 DIAGNOSIS — Z3A37 37 weeks gestation of pregnancy: Secondary | ICD-10-CM

## 2020-11-25 DIAGNOSIS — O0933 Supervision of pregnancy with insufficient antenatal care, third trimester: Secondary | ICD-10-CM

## 2020-11-25 DIAGNOSIS — Z6838 Body mass index (BMI) 38.0-38.9, adult: Secondary | ICD-10-CM

## 2020-11-25 DIAGNOSIS — Z789 Other specified health status: Secondary | ICD-10-CM

## 2020-11-25 NOTE — Patient Instructions (Signed)
Signos y sntomas del trabajo de parto Signs and Symptoms of Labor El [place] de [place] es el proceso natural del cuerpo para sacar al beb y a la placenta del tero. Por lo general, el proceso del trabajo de parto comienza cuando el embarazo ha llegado a su trmino, entre las semanas 33 y 34 del Media planner. Signos y sntomas de que est cerca de empezar el [place] de parto A medida que el cuerpo se prepara para el [place] de parto y el nacimiento del beb, puede notar los siguientes sntomas en las semanas y [place] anteriores al trabajo de parto propiamente dicho:  Eliminacin de una pequea cantidad de mucosidad espesa y sanguinolenta por la vagina. A esto se lo llama aparicin normal de sangre o prdida del tapn mucoso. Esto puede suceder ms de una semana antes de que comience el [place] de [place], o justo antes de que comience el [place] de parto a medida que el cuello uterino comienza a ensancharse (dilatarse). En algunas mujeres, el tapn Walt Disney entero de una sola vez. En otras, pueden salir partes del tapn mucoso de forma gradual [employer].  El beb se mueve (desciende) a la parte inferior de la pelvis para ponerse en posicin para el nacimiento (aligeramiento). Cuando esto sucede, puede sentir ms presin en la vejiga y el hueso plvico, y menos presin en las costillas. Esto facilitar la respiracin. Tambin puede hacer que necesite orinar con ms frecuencia y que tenga problemas para Public house manager.  Tener "contracciones de prctica", tambin llamadas contracciones de [place], o [place] de [place]. Estas se producen a intervalos irregulares (espaciadas de modo desigual) con una diferencia de ms de 10 minutos. Las contracciones de [place] de parto falso son frecuentes despus del ejercicio o la actividad sexual. Se detendrn si cambia de posicin, descansa o bebe lquidos. Estas contracciones son generalmente leves y no se tornan ms fuertes con el tiempo. Pueden  sentirse como lo siguiente: ? Un dolor de espalda. ? Calambres leves, similares a los [employer]. ? Tirantez o presin en el abdomen. Otros sntomas tempranos pueden ser los siguientes:  Nuseas o prdida del apetito.  Diarrea.  Una repentina explosin de energa o sentirse muy cansada.  Cambios en el humor.  Problemas para dormir.   Signos y sntomas de que ha comenzado el trabajo de parto [place] signos de que est en trabajo de parto pueden incluir los siguientes:  Contracciones a intervalos regulares (espaciadas de modo regular) que se incrementan en intensidad. Esto puede sentirse como presin o estrechamiento intenso en el abdomen, que se desplaza hacia la espalda. ? Las contracciones pueden sentirse tambin como dolor rtmico en la parte superior de los muslos y la espalda que va y viene a intervalos regulares. ? Para las [employer] primerizas, este cambio en la intensidad de las contracciones ocurre generalmente a un ritmo ms gradual. ? Las mujeres que ya han dado a luz pueden notar una progresin ms rpida de los cambios de las contracciones.  Sensacin de presin en el rea vaginal.  Ruptura de la bolsa (ruptura de las [place]). Es Cabin crew en que el saco de lquido que rodea al beb se rompe. La prdida de lquido de la vagina puede ser transparente o estar teida de sangre. Generalmente el trabajo de parto comienza 24 horas despus de la ruptura de [place], [place] puede tomar ms Oceanographer. ? Algunas mujeres pueden sentir una prdida repentina de lquido. ? Otras notan que la ropa interior se moja  repetidas veces. Siga estas instrucciones en su casa:  Cuando comience el trabajo de parto o si rompe bolsa, llame al mdico o a la lnea de atencin de enfermera. Ellos determinarn, en funcin de su situacin, cundo debe ir a Geophysical data processor.  Durante el trabajo de parto temprano, es posible que pueda descansar y [employer] sntomas en su casa. Algunas  estrategias para probar en su casa incluyen: ? Tcnicas de respiracin y relajacin. ? Tomar una ducha o un bao de inmersin tibios. ? Conservation officer, nature. ? Usar una almohadilla trmica en la espalda para Best boy. Si se le indica que use calor:  Coloque una toalla entre la piel y la fuente de Freight forwarder.  Aplique calor durante 20 a 24minutos.  Retire la fuente de calor si la piel se pone de color rojo brillante. Esto es especialmente importante si no puede sentir dolor, calor o fro. Puede correr un mayor riesgo de sufrir quemaduras.   Comunquese con un mdico si:  Comenz el trabajo de [place].  Rompe la bolsa. Solicite ayuda de inmediato si:  Tiene contracciones dolorosas y regulares cada 5 minutos o menos.  El trabajo de parto comienza antes de que se cumplan las 58 semanas de [place].  Tiene fiebre.  Elimina cogulos de sangre de color rojo brillante por la vagina.  No siente que el beb se mueva.  Tiene dolor de cabeza intenso con o sin problemas de visin.  Tiene nuseas intensas, vmitos o diarrea.  Siente falta de aire o Tourist information centre manager. Estos sntomas pueden representar un problema grave que constituye Engineer, maintenance (IT). No espere a ver si los sntomas desaparecen. Solicite atencin mdica de inmediato. Comunquese con el servicio de emergencias de su localidad (911 en los [place]). No conduzca por sus propios medios [employer] Financial. Resumen  El trabajo de parto es el proceso natural del cuerpo por el cual se saca al beb y la placenta del tero.  Por lo general, el proceso del trabajo de parto comienza cuando el embarazo ha llegado a su trmino, entre las semanas 25 y 43 de Media planner.  Cuando comience el trabajo de parto o si rompe bolsa, llame al mdico o a la lnea de atencin de enfermera. Ellos determinarn, en funcin de su situacin, cundo debe ir a Geophysical data processor. Esta informacin no tiene Marine scientist el consejo del mdico. Asegrese de  hacerle al mdico cualquier pregunta que tenga. Document Revised: 05/27/2020 Document Reviewed: 05/27/2020 Elsevier Patient Education  2021 [employer].

## 2020-11-25 NOTE — Progress Notes (Signed)
   PRENATAL VISIT NOTE  Subjective:  Regina Shea is a 40 y.o. [id number] at [redacted]w[redacted]d being seen today for ongoing prenatal care.  She is currently monitored for the following issues for this high-risk pregnancy and has Dysmenorrhea; DUB (dysfunctional uterine bleeding); Supervision of high risk pregnancy, antepartum; AMA (advanced maternal age) multigravida 32+; Language barrier; and Lipoma on their problem list.  Patient reports no complaints.  Contractions: Irregular. Vag. Bleeding: None.  Movement: Present. Denies leaking of fluid.   Hasn't been seen since [redacted] weeks gestation. She has been going to her [place] appointments though. She reports some intermittent contractions but nothing that has been consistent.   The following portions of the patient's history were reviewed and updated as appropriate: allergies, current medications, past family history, past medical history, past social history, past surgical history and problem list.   Objective:   Vitals:   11/25/20 1051  BP: 115/87  Pulse: 98  Weight: 209 lb 9.6 oz (95.1 kg)    Fetal Status: Fetal Heart Rate (bpm): 147 Fundal Height: 37 cm Movement: Present  Presentation: Vertex  General:  Alert, oriented and cooperative. Patient is in no acute distress.  Skin: Skin is warm and dry. No rash noted.   Cardiovascular: Normal heart rate noted  Respiratory: Normal respiratory effort, no problems with respiration noted  Abdomen: Soft, gravid, appropriate for gestational age.  Pain/Pressure: Present     Pelvic: Cervical exam performed in the presence of a chaperone Dilation: 1 Effacement (%): Thick Station: -3  Extremities: Normal range of motion.  Edema: Trace  Mental Status: Normal mood and affect. Normal behavior. Normal judgment and thought content.   Assessment and Plan:  Pregnancy: [id number] at [redacted]w[redacted]d 1. Supervision of high risk pregnancy, antepartum  2. Language barrier -in person interpreter used   3. [redacted] weeks gestation of  pregnancy  4. Limited prenatal care in third trimester -will try to catch up on labs today. Swabs obtained. Did not get 2 hr, will obtain a1c today, TDAP given.   5. BMI 30-38  Term labor symptoms and general obstetric precautions including but not limited to vaginal bleeding, contractions, leaking of fluid and fetal movement were reviewed in detail with the patient. Please refer to After Visit Summary for other counseling recommendations.   Return in about 1 week (around 12/02/2020) for OB, any provider.  No future appointments.  Janet Berlin, MD

## 2020-11-25 NOTE — Progress Notes (Signed)
Patient complains of pressure in pelvic along with pain that feels like "pulling" in the vagina. This started 2-3 days ago

## 2020-11-26 LAB — CBC
Hematocrit: 41.3 % (ref 34.0–46.6)
Hemoglobin: 13.7 g/dL (ref 11.1–15.9)
MCH: 28.4 pg (ref 26.6–33.0)
MCHC: 33.2 g/dL (ref 31.5–35.7)
MCV: 86 fL (ref 79–97)
Platelets: 190 10*3/uL (ref 150–450)
RBC: 4.83 x10E6/uL (ref 3.77–5.28)
RDW: 13.4 % (ref 11.7–15.4)
WBC: 8.3 10*3/uL (ref 3.4–10.8)

## 2020-11-26 LAB — HEMOGLOBIN A1C
Est. average glucose Bld gHb Est-mCnc: 134 mg/dL
Hgb A1c MFr Bld: 6.3 % — ABNORMAL HIGH (ref 4.8–5.6)

## 2020-11-26 LAB — HIV ANTIBODY (ROUTINE TESTING W REFLEX): HIV Screen 4th Generation wRfx: NONREACTIVE

## 2020-11-26 LAB — RPR: RPR Ser Ql: NONREACTIVE

## 2020-11-28 LAB — CERVICOVAGINAL ANCILLARY ONLY
Bacterial Vaginitis (gardnerella): POSITIVE — AB
Candida Glabrata: NEGATIVE
Candida Vaginitis: NEGATIVE
Chlamydia: NEGATIVE
Comment: NEGATIVE
Comment: NEGATIVE
Comment: NEGATIVE
Comment: NEGATIVE
Comment: NEGATIVE
Comment: NORMAL
Neisseria Gonorrhea: NEGATIVE
Trichomonas: NEGATIVE

## 2020-11-29 LAB — CULTURE, BETA STREP (GROUP B ONLY): Strep Gp B Culture: POSITIVE — AB

## 2020-11-30 ENCOUNTER — Telehealth: Payer: Self-pay | Admitting: Lactation Services

## 2020-11-30 ENCOUNTER — Other Ambulatory Visit: Payer: Self-pay

## 2020-11-30 DIAGNOSIS — O99820 Streptococcus B carrier state complicating pregnancy: Secondary | ICD-10-CM | POA: Insufficient documentation

## 2020-11-30 MED ORDER — METRONIDAZOLE 500 MG PO TABS
500.0000 mg | ORAL_TABLET | Freq: Two times a day (BID) | ORAL | 0 refills | Status: DC
  Filled 2020-11-30: qty 14, 7d supply, fill #0

## 2020-11-30 NOTE — Telephone Encounter (Signed)
Called patient with assistance of [name] Royal. Administrator, sports.   She was informed that her vaginal swab shows she is + for BV. Informed that ATB will be sent to Pharmacy and can be picked up today. Advised to take all 7 days and to take with food and let us know if she has any questions or concerns.   Patient asked about GBS, she was informed she is + and will receive ATB during labor for treatment, patient voiced understanding.

## 2020-12-02 ENCOUNTER — Other Ambulatory Visit: Payer: Self-pay

## 2020-12-06 ENCOUNTER — Other Ambulatory Visit: Payer: Self-pay

## 2020-12-06 ENCOUNTER — Encounter (HOSPITAL_COMMUNITY): Payer: Self-pay | Admitting: *Deleted

## 2020-12-06 ENCOUNTER — Other Ambulatory Visit: Payer: Self-pay | Admitting: Obstetrics and Gynecology

## 2020-12-06 ENCOUNTER — Other Ambulatory Visit: Payer: Self-pay | Admitting: Advanced Practice Midwife

## 2020-12-06 ENCOUNTER — Ambulatory Visit (INDEPENDENT_AMBULATORY_CARE_PROVIDER_SITE_OTHER): Payer: Self-pay | Admitting: Obstetrics and Gynecology

## 2020-12-06 ENCOUNTER — Telehealth (HOSPITAL_COMMUNITY): Payer: Self-pay | Admitting: *Deleted

## 2020-12-06 VITALS — BP 130/85 | HR 94 | Wt 207.8 lb

## 2020-12-06 DIAGNOSIS — Z789 Other specified health status: Secondary | ICD-10-CM

## 2020-12-06 DIAGNOSIS — Z6838 Body mass index (BMI) 38.0-38.9, adult: Secondary | ICD-10-CM

## 2020-12-06 DIAGNOSIS — O163 Unspecified maternal hypertension, third trimester: Secondary | ICD-10-CM

## 2020-12-06 DIAGNOSIS — Z3A38 38 weeks gestation of pregnancy: Secondary | ICD-10-CM

## 2020-12-06 DIAGNOSIS — Z5941 Food insecurity: Secondary | ICD-10-CM

## 2020-12-06 DIAGNOSIS — O0933 Supervision of pregnancy with insufficient antenatal care, third trimester: Secondary | ICD-10-CM

## 2020-12-06 DIAGNOSIS — O0990 Supervision of high risk pregnancy, unspecified, unspecified trimester: Secondary | ICD-10-CM

## 2020-12-06 DIAGNOSIS — O99820 Streptococcus B carrier state complicating pregnancy: Secondary | ICD-10-CM

## 2020-12-06 LAB — POCT URINALYSIS DIP (DEVICE)
Bilirubin Urine: NEGATIVE
Glucose, UA: NEGATIVE mg/dL
Hgb urine dipstick: NEGATIVE
Ketones, ur: NEGATIVE mg/dL
Leukocytes,Ua: NEGATIVE
Nitrite: NEGATIVE
Protein, ur: NEGATIVE mg/dL
Specific Gravity, Urine: >=1.03 (ref 1.005–1.030)
Urobilinogen, UA: 0.2 mg/dL (ref 0.0–1.0)
pH: 5.5 (ref 5.0–8.0)

## 2020-12-06 NOTE — Telephone Encounter (Signed)
Preadmission screen  

## 2020-12-06 NOTE — Progress Notes (Signed)
   PRENATAL VISIT NOTE  Subjective:  Regina Shea is a 40 y.o. [id number] at [redacted]w[redacted]d being seen today for ongoing prenatal care.  She is currently monitored for the following issues for this high-risk pregnancy and has Dysmenorrhea; DUB (dysfunctional uterine bleeding); Supervision of high risk pregnancy, antepartum; AMA (advanced maternal age) multigravida 78+; Language barrier; Lipoma; and GBS (group B Streptococcus carrier), +RV culture, currently pregnant on their problem list.  Patient reports contractions and anxiety.  Contractions: Irregular. Vag. Bleeding: None.  Movement: Present. Denies leaking of fluid. Feeling very anxious about this baby, unsure why.   The following portions of the patient's history were reviewed and updated as appropriate: allergies, current medications, past family history, past medical history, past social history, past surgical history and problem list.   Objective:   Vitals:   12/06/20 0829 12/06/20 0854  BP: (!) 144/79 130/85  Pulse: 94 94  Weight: 207 lb 12.8 oz (94.3 kg)     Fetal Status: Fetal Heart Rate (bpm): 142 Fundal Height: 38 cm Movement: Present  Presentation: Vertex  General:  Alert, oriented and cooperative. Patient is in no acute distress.  Skin: Skin is warm and dry. No rash noted.   Cardiovascular: Normal heart rate noted  Respiratory: Normal respiratory effort, no problems with respiration noted  Abdomen: Soft, gravid, appropriate for gestational age.  Pain/Pressure: Present     Pelvic: Cervical exam performed in the presence of a chaperone Dilation: 1.5 Effacement (%): 50 Station: -3  Extremities: Normal range of motion.  Edema: Trace  Mental Status: Normal mood and affect. Normal behavior. Normal judgment and thought content.   Assessment and Plan:  Pregnancy: [id number] at [redacted]w[redacted]d 1. Supervision of high risk pregnancy, antepartum -elevated BP, on recheck is normal. Will draw [place] labs. No signs or symptoms of PEC.  -patient  desires elective IOL in setting of childcare issues. Reviewed induction calendar and scheduled for 5/15 at midnight.   2. Language barrier -in person interpreter used  3. GBS (group B Streptococcus carrier), +RV culture, currently pregnant -ppx in labor  4. Limited prenatal care in third trimester   5. BMI 38.0-38.9,adult    6. [redacted] weeks gestation of pregnancy   Term labor symptoms and general obstetric precautions including but not limited to vaginal bleeding, contractions, leaking of fluid and fetal movement were reviewed in detail with the patient. Please refer to After Visit Summary for other counseling recommendations.   No follow-ups on file.  Future Appointments  Date Time Provider [place]  12/14/2020  9:55 AM Helaine Chess [facility]  12/21/2020  8:15 AM Gabriel Carina, CNM [facility]    Janet Berlin, MD

## 2020-12-06 NOTE — Addendum Note (Signed)
Addended by: Mena Goes on: 12/06/2020 09:51 AM   Modules accepted: Orders

## 2020-12-06 NOTE — Progress Notes (Signed)
Patient states that she has been having contractions that started 2 weeks ago and has been having them daily. They would start around 3 pm and end around 8 pm, every 20-25 minutes. Patient also complains of a "pulling" sensation in vaginal area. Blood pressure today is 144/79 94 (right arm)

## 2020-12-07 LAB — COMPREHENSIVE METABOLIC PANEL
ALT: 18 IU/L (ref 0–32)
AST: 22 IU/L (ref 0–40)
Albumin/Globulin Ratio: 1.1 — ABNORMAL LOW (ref 1.2–2.2)
Albumin: 3.3 g/dL — ABNORMAL LOW (ref 3.8–4.8)
Alkaline Phosphatase: 195 IU/L — ABNORMAL HIGH (ref 44–121)
BUN/Creatinine Ratio: 15 (ref 9–23)
BUN: 8 mg/dL (ref 6–20)
Bilirubin Total: 0.3 mg/dL (ref 0.0–1.2)
CO2: 17 mmol/L — ABNORMAL LOW (ref 20–29)
Calcium: 9.4 mg/dL (ref 8.7–10.2)
Chloride: 102 mmol/L (ref 96–106)
Creatinine, Ser: 0.54 mg/dL — ABNORMAL LOW (ref 0.57–1.00)
Globulin, Total: 2.9 g/dL (ref 1.5–4.5)
Glucose: 128 mg/dL — ABNORMAL HIGH (ref 65–99)
Potassium: 3.8 mmol/L (ref 3.5–5.2)
Sodium: 136 mmol/L (ref 134–144)
Total Protein: 6.2 g/dL (ref 6.0–8.5)
eGFR: 120 mL/min/{1.73_m2} (ref >59)

## 2020-12-07 LAB — CBC
Hematocrit: 42.2 % (ref 34.0–46.6)
Hemoglobin: 13.9 g/dL (ref 11.1–15.9)
MCH: 28.7 pg (ref 26.6–33.0)
MCHC: 32.9 g/dL (ref 31.5–35.7)
MCV: 87 fL (ref 79–97)
Platelets: 190 10*3/uL (ref 150–450)
RBC: 4.84 x10E6/uL (ref 3.77–5.28)
RDW: 13.6 % (ref 11.7–15.4)
WBC: 8 10*3/uL (ref 3.4–10.8)

## 2020-12-07 LAB — PROTEIN / CREATININE RATIO, URINE
Creatinine, Urine: 120.3 mg/dL
Protein, Ur: 24.6 mg/dL
Protein/Creat Ratio: 204 mg/g creat — ABNORMAL HIGH (ref 0–200)

## 2020-12-09 ENCOUNTER — Other Ambulatory Visit (HOSPITAL_COMMUNITY)
Admission: RE | Admit: 2020-12-09 | Discharge: 2020-12-09 | Disposition: A | Discharge status: Home / Self Care | Payer: Self-pay | Source: Ambulatory Visit | Attending: Obstetrics and Gynecology | Admitting: Obstetrics and Gynecology

## 2020-12-09 DIAGNOSIS — Z01812 Encounter for preprocedural laboratory examination: Secondary | ICD-10-CM | POA: Insufficient documentation

## 2020-12-09 DIAGNOSIS — Z20822 Contact with and (suspected) exposure to covid-19: Secondary | ICD-10-CM | POA: Insufficient documentation

## 2020-12-10 LAB — SARS CORONAVIRUS 2 (TAT 6-24 HRS): SARS Coronavirus 2: NEGATIVE

## 2020-12-11 ENCOUNTER — Other Ambulatory Visit: Payer: Self-pay

## 2020-12-11 ENCOUNTER — Inpatient Hospital Stay (HOSPITAL_COMMUNITY): Payer: Medicaid Other

## 2020-12-11 ENCOUNTER — Inpatient Hospital Stay (HOSPITAL_COMMUNITY)
Admission: AD | Admit: 2020-12-11 | Discharge: 2020-12-12 | DRG: 807 | Disposition: A | Discharge status: Home / Self Care | Payer: Medicaid Other | Attending: Family Medicine | Admitting: Family Medicine

## 2020-12-11 ENCOUNTER — Encounter (HOSPITAL_COMMUNITY): Payer: Self-pay | Admitting: Obstetrics & Gynecology

## 2020-12-11 ENCOUNTER — Inpatient Hospital Stay (HOSPITAL_COMMUNITY)
Admission: EM | Admit: 2020-12-11 | Discharge: 2020-12-11 | Disposition: A | Discharge status: Home / Self Care | Payer: Medicaid Other | Attending: Obstetrics & Gynecology | Admitting: Obstetrics & Gynecology

## 2020-12-11 ENCOUNTER — Inpatient Hospital Stay (HOSPITAL_COMMUNITY): Payer: Medicaid Other | Admitting: Anesthesiology

## 2020-12-11 ENCOUNTER — Encounter (HOSPITAL_COMMUNITY): Payer: Self-pay | Admitting: Obstetrics and Gynecology

## 2020-12-11 DIAGNOSIS — Z79899 Other long term (current) drug therapy: Secondary | ICD-10-CM | POA: Diagnosis not present

## 2020-12-11 DIAGNOSIS — Z603 Acculturation difficulty: Secondary | ICD-10-CM | POA: Diagnosis present

## 2020-12-11 DIAGNOSIS — O99824 Streptococcus B carrier state complicating childbirth: Secondary | ICD-10-CM | POA: Diagnosis present

## 2020-12-11 DIAGNOSIS — O471 False labor at or after 37 completed weeks of gestation: Secondary | ICD-10-CM | POA: Diagnosis not present

## 2020-12-11 DIAGNOSIS — Z880 Allergy status to penicillin: Secondary | ICD-10-CM

## 2020-12-11 DIAGNOSIS — M549 Dorsalgia, unspecified: Secondary | ICD-10-CM | POA: Insufficient documentation

## 2020-12-11 DIAGNOSIS — Z3490 Encounter for supervision of normal pregnancy, unspecified, unspecified trimester: Secondary | ICD-10-CM | POA: Diagnosis present

## 2020-12-11 DIAGNOSIS — O479 False labor, unspecified: Secondary | ICD-10-CM

## 2020-12-11 DIAGNOSIS — O26893 Other specified pregnancy related conditions, third trimester: Secondary | ICD-10-CM | POA: Diagnosis present

## 2020-12-11 DIAGNOSIS — Z789 Other specified health status: Secondary | ICD-10-CM | POA: Diagnosis present

## 2020-12-11 DIAGNOSIS — R109 Unspecified abdominal pain: Secondary | ICD-10-CM | POA: Insufficient documentation

## 2020-12-11 DIAGNOSIS — Z3A39 39 weeks gestation of pregnancy: Secondary | ICD-10-CM

## 2020-12-11 DIAGNOSIS — O0990 Supervision of high risk pregnancy, unspecified, unspecified trimester: Secondary | ICD-10-CM

## 2020-12-11 DIAGNOSIS — O09529 Supervision of elderly multigravida, unspecified trimester: Secondary | ICD-10-CM

## 2020-12-11 DIAGNOSIS — O99820 Streptococcus B carrier state complicating pregnancy: Secondary | ICD-10-CM

## 2020-12-11 LAB — CBC
HCT: 39.7 % (ref 36.0–46.0)
Hemoglobin: 13.2 g/dL (ref 12.0–15.0)
MCH: 28.6 pg (ref 26.0–34.0)
MCHC: 33.2 g/dL (ref 30.0–36.0)
MCV: 85.9 fL (ref 80.0–100.0)
Platelets: 209 10*3/uL (ref 150–400)
RBC: 4.62 MIL/uL (ref 3.87–5.11)
RDW: 13.7 % (ref 11.5–15.5)
WBC: 7.6 10*3/uL (ref 4.0–10.5)
nRBC: 0 % (ref 0.0–0.2)

## 2020-12-11 LAB — RPR: RPR Ser Ql: NONREACTIVE

## 2020-12-11 LAB — POCT FERN TEST: POCT Fern Test: NEGATIVE

## 2020-12-11 LAB — TYPE AND SCREEN
ABO/RH(D): A POS
Antibody Screen: NEGATIVE

## 2020-12-11 MED ORDER — SODIUM CHLORIDE 0.9 % IV SOLN
5.0000 10*6.[IU] | Freq: Once | INTRAVENOUS | Status: AC
  Administered 2020-12-11: 5 10*6.[IU] via INTRAVENOUS
  Filled 2020-12-11: qty 5

## 2020-12-11 MED ORDER — BENZOCAINE-MENTHOL 20-0.5 % EX AERO: 1.0000 "application " | INHALATION_SPRAY | CUTANEOUS | Status: DC | PRN

## 2020-12-11 MED ORDER — MEDROXYPROGESTERONE ACETATE 150 MG/ML IM SUSP
150.0000 mg | INTRAMUSCULAR | Status: AC | PRN
  Administered 2020-12-12: 150 mg via INTRAMUSCULAR
  Filled 2020-12-11: qty 1

## 2020-12-11 MED ORDER — TETANUS-DIPHTH-ACELL PERTUSSIS 5-2.5-18.5 LF-MCG/0.5 IM SUSY: 0.5000 mL | PREFILLED_SYRINGE | Freq: Once | INTRAMUSCULAR | Status: DC

## 2020-12-11 MED ORDER — DIBUCAINE (PERIANAL) 1 % EX OINT: 1.0000 "application " | TOPICAL_OINTMENT | CUTANEOUS | Status: DC | PRN

## 2020-12-11 MED ORDER — EPHEDRINE 5 MG/ML INJ: 10.0000 mg | INTRAVENOUS | Status: DC | PRN

## 2020-12-11 MED ORDER — LACTATED RINGERS IV SOLN: INTRAVENOUS | Status: DC

## 2020-12-11 MED ORDER — ONDANSETRON HCL 4 MG/2ML IJ SOLN: 4.0000 mg | INTRAMUSCULAR | Status: DC | PRN

## 2020-12-11 MED ORDER — ACETAMINOPHEN 325 MG PO TABS: 650.0000 mg | ORAL_TABLET | ORAL | Status: DC | PRN

## 2020-12-11 MED ORDER — OXYTOCIN BOLUS FROM INFUSION
333.0000 mL | Freq: Once | INTRAVENOUS | Status: AC
  Administered 2020-12-11: 333 mL via INTRAVENOUS

## 2020-12-11 MED ORDER — LACTATED RINGERS IV SOLN: 500.0000 mL | Freq: Once | INTRAVENOUS | Status: DC

## 2020-12-11 MED ORDER — MAGNESIUM HYDROXIDE 400 MG/5ML PO SUSP: 30.0000 mL | ORAL | Status: DC | PRN

## 2020-12-11 MED ORDER — ONDANSETRON HCL 4 MG/2ML IJ SOLN: 4.0000 mg | Freq: Four times a day (QID) | INTRAMUSCULAR | Status: DC | PRN

## 2020-12-11 MED ORDER — LIDOCAINE HCL (PF) 1 % IJ SOLN
INTRAMUSCULAR | Status: DC | PRN
  Administered 2020-12-11 (×2): 4 mL via EPIDURAL

## 2020-12-11 MED ORDER — SIMETHICONE 80 MG PO CHEW: 80.0000 mg | CHEWABLE_TABLET | ORAL | Status: DC | PRN

## 2020-12-11 MED ORDER — FENTANYL-BUPIVACAINE-NACL 0.5-0.125-0.9 MG/250ML-% EP SOLN
12.0000 mL/h | EPIDURAL | Status: DC | PRN
  Administered 2020-12-11: 12 mL/h via EPIDURAL
  Filled 2020-12-11: qty 250

## 2020-12-11 MED ORDER — DIPHENHYDRAMINE HCL 25 MG PO CAPS: 25.0000 mg | ORAL_CAPSULE | Freq: Four times a day (QID) | ORAL | Status: DC | PRN

## 2020-12-11 MED ORDER — METHYLERGONOVINE MALEATE 0.2 MG PO TABS
0.2000 mg | ORAL_TABLET | Freq: Four times a day (QID) | ORAL | Status: DC
  Administered 2020-12-11 – 2020-12-12 (×3): 0.2 mg via ORAL
  Filled 2020-12-11 (×3): qty 1

## 2020-12-11 MED ORDER — DIPHENHYDRAMINE HCL 50 MG/ML IJ SOLN: 12.5000 mg | INTRAMUSCULAR | Status: DC | PRN

## 2020-12-11 MED ORDER — PHENYLEPHRINE 40 MCG/ML (10ML) SYRINGE FOR IV PUSH (FOR BLOOD PRESSURE SUPPORT): 80.0000 ug | PREFILLED_SYRINGE | INTRAVENOUS | Status: DC | PRN

## 2020-12-11 MED ORDER — COCONUT OIL OIL: 1.0000 "application " | TOPICAL_OIL | Status: DC | PRN

## 2020-12-11 MED ORDER — IBUPROFEN 600 MG PO TABS
600.0000 mg | ORAL_TABLET | Freq: Four times a day (QID) | ORAL | Status: DC
  Administered 2020-12-11 – 2020-12-12 (×4): 600 mg via ORAL
  Filled 2020-12-11 (×4): qty 1

## 2020-12-11 MED ORDER — PRENATAL MULTIVITAMIN CH
1.0000 | ORAL_TABLET | Freq: Every day | ORAL | Status: DC
  Administered 2020-12-12: 1 via ORAL
  Filled 2020-12-11: qty 1

## 2020-12-11 MED ORDER — MEASLES, MUMPS & RUBELLA VAC IJ SOLR: 0.5000 mL | Freq: Once | INTRAMUSCULAR | Status: DC

## 2020-12-11 MED ORDER — OXYCODONE-ACETAMINOPHEN 5-325 MG PO TABS
1.0000 | ORAL_TABLET | ORAL | Status: DC | PRN
  Administered 2020-12-12 (×2): 1 via ORAL
  Filled 2020-12-11 (×2): qty 1

## 2020-12-11 MED ORDER — WITCH HAZEL-GLYCERIN EX PADS: 1.0000 "application " | MEDICATED_PAD | CUTANEOUS | Status: DC | PRN

## 2020-12-11 MED ORDER — SOD CITRATE-CITRIC ACID 500-334 MG/5ML PO SOLN: 30.0000 mL | ORAL | Status: DC | PRN

## 2020-12-11 MED ORDER — FENTANYL CITRATE (PF) 100 MCG/2ML IJ SOLN
50.0000 ug | INTRAMUSCULAR | Status: DC | PRN
  Administered 2020-12-11 (×2): 100 ug via INTRAVENOUS
  Filled 2020-12-11 (×2): qty 2

## 2020-12-11 MED ORDER — ONDANSETRON HCL 4 MG PO TABS: 4.0000 mg | ORAL_TABLET | ORAL | Status: DC | PRN

## 2020-12-11 MED ORDER — OXYCODONE-ACETAMINOPHEN 5-325 MG PO TABS: 2.0000 | ORAL_TABLET | ORAL | Status: DC | PRN

## 2020-12-11 MED ORDER — LACTATED RINGERS IV SOLN: 500.0000 mL | INTRAVENOUS | Status: DC | PRN

## 2020-12-11 MED ORDER — PENICILLIN G POT IN DEXTROSE 60000 UNIT/ML IV SOLN
3.0000 10*6.[IU] | INTRAVENOUS | Status: DC
  Administered 2020-12-11 (×2): 3 10*6.[IU] via INTRAVENOUS
  Filled 2020-12-11 (×2): qty 50

## 2020-12-11 MED ORDER — MISOPROSTOL 50MCG HALF TABLET
50.0000 ug | ORAL_TABLET | ORAL | Status: DC | PRN
  Administered 2020-12-11 (×2): 50 ug via BUCCAL
  Filled 2020-12-11 (×2): qty 1

## 2020-12-11 MED ORDER — OXYTOCIN-SODIUM CHLORIDE 30-0.9 UT/500ML-% IV SOLN
2.5000 [IU]/h | INTRAVENOUS | Status: DC
  Filled 2020-12-11: qty 500

## 2020-12-11 MED ORDER — LIDOCAINE HCL (PF) 1 % IJ SOLN: 30.0000 mL | INTRAMUSCULAR | Status: DC | PRN

## 2020-12-11 NOTE — Progress Notes (Signed)
Regina Shea is a 40 y.o. [id number] at [redacted]w[redacted]d.  Subjective: Pelvic pressure  Objective: BP 121/67   Pulse 88   Temp 97.8 F (36.6 C) (Oral)   Resp 16   LMP 04/17/2020   SpO2 100%    FHT:  FHR: 140 bpm, variability: mod,  accelerations:  15x15,  decelerations:  Mild varibales UC:   Q 2-4 minutes, strong Dilation: 8 Effacement (%): 90 Cervical Position: Posterior Station: Plus 1 Presentation: Vertex Exam by:: va Makaylee Spielberg, cnm AROM moderate amount of clear fluid  Labs: Results for orders placed or performed during the hospital encounter of 12/11/20 (from the past 24 hour(s))  CBC     Status: None   Collection Time: 12/11/20  7:15 AM  Result Value Ref Range   WBC 7.6 4.0 - 10.5 K/uL   RBC 4.62 3.87 - 5.11 MIL/uL   Hemoglobin 13.2 12.0 - 15.0 g/dL   HCT 39.7 36.0 - 46.0 %   MCV 85.9 80.0 - 100.0 fL   MCH 28.6 26.0 - 34.0 pg   MCHC 33.2 30.0 - 36.0 g/dL   RDW 13.7 11.5 - 15.5 %   Platelets 209 150 - 400 K/uL   nRBC 0.0 0.0 - 0.2 %  Type and screen     Status: None   Collection Time: 12/11/20  7:15 AM  Result Value Ref Range   ABO/RH(D) A POS    Antibody Screen NEG    Sample Expiration      12/14/2020,2359 Performed at [facility], [place] [address], [place], [place] [address]   RPR     Status: None   Collection Time: 12/11/20  7:15 AM  Result Value Ref Range   RPR Ser Ql NON REACTIVE NON REACTIVE    Assessment / Plan: [redacted]w[redacted]d week IUP Labor: Transition Fetal Wellbeing:  Category I-II overall reassuring Pain Control:  Epidural Anticipated MOD:  SVD  Tamala Julian [place], [place] 12/11/2020 4:40 PM

## 2020-12-11 NOTE — H&P (Addendum)
HPI: Regina Shea is a 40 y.o. year old [id number] female at [redacted]w[redacted]d weeks gestation who presents for elective IOL.   Feels liek this baby is bigger than previous babies. Pelvic proven to 3500 gm. Last [place] 11/14/20 Est. FW:    3048  gm    6 lb 12 oz      83  %  Nursing Staff Provider  Office Location  [place] Dating      [place] at 9 weeks  Language   Spanish Anatomy US    Normal   Flu Vaccine  06/15/20 Genetic Screen  NIPS: low risk female   AFP:   Screen negative   TDaP Vaccine   11/25/20 Hgb A1C or  GTT Early early hgbA1c is 5.6 Third trimester   COVID Vaccine  Yes x2   LAB RESULTS   Rhogam  NA Blood Type A/Positive/-- (11/11 0914)   Feeding Plan  Breast / bottle  Antibody Negative (11/11 0914)  Contraception Undecided Rubella 3.37 (11/11 0914)  Circumcision No RPR Non Reactive (11/11 0914)  Pediatrician  [address] HBsAg Negative (11/11 0914)    Support Person  Husband HCVAb Neg  Prenatal Classes  HIV Non Reactive (11/11 0914)   NR  BTL Consent NA GBS   (For PCN allergy, check sensitivities)   VBAC Consent  N/a Pap     Hgb Electro   horizon negative  BP Cuff  N/A will have all visits in office CF  horizon negative    SMA  horizon negative    Waterbirth  [ ]  Class [ ]  Consent [ ]  CNM visit    Induction  [ ]  Orders Entered [ ] Foley Y/N   Spanish interpreter used.   OB History    Gravida  6   Para  4   Term  4   Preterm  0   AB  1   Living  4     SAB  1   IAB  0   Ectopic  0   Multiple  0   Live Births  4          Past Medical History:  Diagnosis Date  . Asthma    h/o no inhalers  . Chest pain 01/20/2019  . Fistula-in-ano   . Headache    migraines   Past Surgical History:  Procedure Laterality Date  . NO PAST SURGERIES    . RECTAL EXAM UNDER ANESTHESIA N/A 01/23/2019   Procedure: RECTAL EXAM UNDER ANESTHESIA, with FISTULOTOMY and  FIBRIN GLUE;  Surgeon: Fredirick Maudlin, MD;  Location: [facility] ORS;  Service: General;  Laterality: N/A;   Family  History: family history includes Asthma in her mother; Cancer in her maternal uncle; Hypertension in her father and mother. Social History:  reports that she has never smoked. She has never used smokeless tobacco. She reports that she does not drink alcohol and does not use drugs.     Maternal Diabetes: No Genetic Screening: Normal Maternal Ultrasounds/Referrals: Normal Fetal Ultrasounds or other Referrals:  None, Referred to Materal Fetal Medicine  Maternal Substance Abuse:  No Significant Maternal Medications:  None Significant Maternal Lab Results:  Group B Strep positive Other Comments:  None  Review of Systems  Constitutional: Negative for chills and fever.  Eyes: Negative for visual disturbance.  Gastrointestinal: Positive for abdominal pain (contractions). Negative for nausea and vomiting.  Genitourinary: Negative for vaginal bleeding.  Neurological: Negative for headaches.   Maternal Medical History:  Reason for admission: Nausea. Elective induction  of labor  Contractions: Frequency: irregular.   Perceived severity is mild.    Fetal activity: Perceived fetal activity is normal.    Prenatal Complications - Diabetes: none.    Dilation: 2 Effacement (%): 60 Station: -3 Exam by:: k fields, rn Blood pressure 132/71, pulse 100, temperature 98.1 F (36.7 C), temperature source Oral, resp. rate 16, last menstrual period 04/17/2020. Maternal Exam:  Uterine Assessment: Contraction strength is mild.  Contraction frequency is irregular.   Abdomen: Patient reports no abdominal tenderness. Estimated fetal weight is 8lb 8oz.   Fetal presentation: vertex  Introitus: Normal vulva. Vulva is negative for lesion.  Normal vagina.  Pelvis: adequate for delivery.   Cervix: Cervix evaluated by digital exam.     Fetal Exam Fetal Monitor Review: Baseline rate: 135.  Variability: moderate (6-25 bpm).   Pattern: accelerations present and no decelerations.    Fetal State  Assessment: Category I - tracings are normal.     Physical Exam Constitutional:      General: She is in acute distress (mild).     Appearance: She is not ill-appearing or toxic-appearing.  HENT:     Head: Normocephalic.  Eyes:     Conjunctiva/sclera: Conjunctivae normal.  Cardiovascular:     Rate and Rhythm: Normal rate.  Pulmonary:     Effort: Respiratory distress present.  Abdominal:     General: There is no distension.     Palpations: Abdomen is soft.     Tenderness: There is no abdominal tenderness.  Genitourinary:    General: Normal vulva.  Vulva is no lesion.  Musculoskeletal:     Right lower leg: No edema.     Left lower leg: No edema.  Skin:    General: Skin is warm and dry.  Neurological:     General: No focal deficit present.     Mental Status: She is alert and oriented to person, place, and time.  Psychiatric:        Mood and Affect: Mood normal.     Prenatal labs: ABO, Rh: --/--/A POS (05/15 0715) Antibody: NEG (05/15 0715) Rubella: 3.37 (11/11 0914) RPR: Non Reactive (04/29 1123)  HBsAg: Negative (11/11 0914)  HIV: Non Reactive (04/29 1123)  GBS: Positive/-- (04/29 1120)   Assessment: 1. Labor: Elective IOL 2. Fetal Wellbeing: Category I  3. Pain Control: Comfort measures 4. GBS: Pos 5. 39.3 week IUP 6. Suspected fetal macrosomia  Plan:  1. Admit to BS per consult with MD 2. Routine L&D orders 3. Analgesia/anesthesia PRN  4. PCN 5. Cytotec, then Pit, AROM PRN 6. Watch labor curve closely. SD precautions.    [name] 12/11/2020, 8:36 AM

## 2020-12-11 NOTE — Discharge Instructions (Signed)
Rosen's Emergency Medicine: Concepts and Clinical Practice (9th ed., pp. [phone]- 2312). Elsevier.">  Contracciones de [employer] Braxton [employer] Las contracciones del tero pueden presentarse durante todo el [place], [place] no siempre indican que la mujer est de [place]. Es posible que usted haya tenido contracciones de prctica llamadas "contracciones de [place]". A veces, se las confunde con el parto real. Qu son las contracciones de [place]? Las contracciones de [place] son espasmos que se producen en los msculos del tero antes del [place]. A diferencia de las contracciones del parto verdadero, estas no producen el agrandamiento (la dilatacin) ni el afinamiento del cuello uterino. Hacia el final del embarazo [facility] las semanas [phone]), las contracciones de Braxton Hicks pueden presentarse ms seguido y tornarse ms intensas. A veces, resulta difcil distinguirlas del parto verdadero porque pueden ser [employer]. No debe sentirse avergonzada si concurre al hospital con falso parto. En ocasiones, la nica forma de saber si el trabajo de parto es verdadero es que el mdico determine si hay cambios en el cuello del tero. El [employer] har un examen fsico y Armed forces operational officer controle las contracciones. Si usted no est de Network engineer, el examen debe indicar que el cuello uterino no est dilatado y que usted no ha roto Financial trader. Si no hay otros problemas de salud asociados con su embarazo, no habr inconvenientes si la envan a su casa con un falso parto. Es posible que las contracciones de Braxton Hicks continen hasta que se desencadene el parto verdadero. Cmo diferenciar el [place] de parto falso del verdadero Trabajo de parto verdadero  Las contracciones [place].  Las contracciones pueden tornarse muy regulares.  La molestia generalmente se siente en la parte superior del tero y se extiende hacia la zona baja del abdomen y [employer] cintura.  Las  contracciones no desaparecen cuando usted camina.  Las contracciones generalmente se hacen ms intensas y Copywriter, advertising.  El cuello uterino se dilata y se afina. Parto falso  En general, las contracciones son ms cortas y no tan intensas como las del parto verdadero.  En general, las contracciones son irregulares.  A menudo, las contracciones se sienten en la parte delantera de la parte baja del abdomen y en la ingle.  Las Physiological scientist cuando usted camina o [place] de posicin mientras est [place].  Las contracciones se vuelven ms dbiles y su duracin es menor a medida que transcurre [employer].  En general, el cuello uterino no se dilata ni se afina. Siga estas indicaciones en su casa:  Tome los medicamentos de venta libre y los recetados solamente como se lo haya indicado el mdico.  Contine haciendo los ejercicios habituales y siga las dems indicaciones que el mdico le d.  Coma y beba con moderacin si cree que est de parto.  Si las contracciones de KeyCorp provocan incomodidad: ? Cambie de posicin: si est acostada o descansando, camine; si est caminando, descanse. ? Sintese y descanse en una baera con agua tibia. ? Beba suficiente lquido como para mantener la orina de color amarillo plido. La deshidratacin puede provocar contracciones. ? Respire lenta y profundamente varias veces por hora.  Vaya a todas las visitas de control prenatales y de control como se lo haya indicado el mdico. Esto es importante.   Comunquese con un mdico si:  Tiene fiebre.  Siente dolor constante en el abdomen. Solicite ayuda de inmediato si:  Las contracciones se intensifican, se hacen ms regulares  y [employer] s.  Tiene una prdida de lquido por la vagina.  Elimina una mucosidad sanguinolenta (prdida del tapn mucoso).  Tiene una hemorragia vaginal.  Tiene un dolor en la zona lumbar que nunca tuvo antes.  Siente que la  cabeza del beb empuja hacia abajo y ejerce presin en la zona plvica.  El beb no se mueve tanto como antes. Resumen  Las [employer] se presentan antes del parto se conocen como contracciones de [place], [place] parto o contracciones de Location manager.  En general, las contracciones de Braxton Hicks son ms cortas, ms dbiles, con ms tiempo entre una y [place], y menos regulares que las contracciones del parto verdadero. Las contracciones del parto verdadero se intensifican progresivamente y se tornan regulares y ms frecuentes.  Para controlar la [employer] producen las contracciones de [place], puede cambiar de posicin, darse un bao templado y Production assistant, radio, beber mucha agua o practicar la respiracin profunda. Esta informacin no tiene Marine scientist el consejo del mdico. Asegrese de hacerle al mdico cualquier pregunta que tenga. Document Revised: 10/25/2017 Document Reviewed: 02/25/2017 Elsevier Patient Education  Love.

## 2020-12-11 NOTE — Progress Notes (Signed)
S: [name] is a 40 y.o. [id number] at [redacted]w[redacted]d  who presents to MAU today for labor evaluation.     Cervical exam by RN:  Dilation: 2 Effacement (%): 60 Station: -3 Exam by:: AEulas Post RN  Fetal Monitoring: Baseline: 130bpm Variability: moderate Accelerations: present Decelerations: absent Contractions: intermittent  MDM Discussed patient with RN. NST reviewed.   A: SIUP at [redacted]w[redacted]d  False labor  P: Patient scheduled for midnight IOL tonight, due to RN staffing patient has not been called to L&D yet. Patient frustrated and informed L&D she would present to MAU for evaluation. Patient unchanged cervical exam over 1 hour, reactive NST, reassuring VSS. Will call patient for elective IOL when able. Discharge home Labor precautions and kick counts included in AVS Patient may return to MAU as needed or when in labor   Arrie Senate, MD 11/25/7679 1:42 AM

## 2020-12-11 NOTE — Anesthesia Procedure Notes (Signed)
Epidural Patient location during procedure: OB Start time: 12/11/2020 3:59 PM End time: 12/11/2020 4:09 PM  Staffing Anesthesiologist: Josephine Igo, MD Performed: anesthesiologist   Preanesthetic Checklist Completed: patient identified, IV checked, site marked, risks and benefits discussed, surgical consent, monitors and equipment checked, pre-op evaluation and timeout performed  Epidural Patient position: sitting Prep: DuraPrep and site prepped and draped Patient monitoring: continuous pulse ox and blood pressure Approach: midline Location: L3-L4 Injection technique: LOR air  Needle:  Needle type: Tuohy  Needle gauge: 17 G Needle length: 9 cm and 9 Needle insertion depth: 6 cm Catheter type: closed end flexible Catheter size: 19 Gauge Catheter at skin depth: 11 cm Test dose: negative and Other  Assessment Events: blood not aspirated, injection not painful, no injection resistance, no paresthesia and negative IV test  Additional Notes Attempt x 3. Difficult due to poor position. Patient identified. Risks and benefits discussed including failed block, incomplete  Pain control, post dural puncture headache, nerve damage, paralysis, blood pressure Changes, nausea, vomiting, reactions to medications-both toxic and allergic and post Partum back pain. All questions were answered. Patient expressed understanding and wished to proceed. Sterile technique was used throughout procedure. Epidural site was Dressed with sterile barrier dressing. No paresthesias, signs of intravascular injection Or signs of intrathecal spread were encountered.  Patient was more comfortable after the epidural was dosed. Please see RN's note for documentation of vital signs and FHR which are stable. Reason for block:procedure for pain

## 2020-12-11 NOTE — ED Notes (Signed)
MAU and transport called.

## 2020-12-11 NOTE — ED Provider Notes (Signed)
[facility] EMERGENCY DEPARTMENT Provider Note   CSN: [id number] Arrival date & time: 12/11/20  0028     History Chief Complaint  Patient presents with  . Abdominal Pain    Regina Shea is a 40 y.o. female.  Patient presents to the emergency department with chief complaints of abdominal pain/contractions.  She is [redacted] weeks pregnant.  She is G1P0.  She reports having abdominal cramps for the past several hours.  Reports that she is having contractions every 5 minutes.  She reports that she has lost her mucous plug.  She reports associated back pain.  She denies any other associated symptoms.  The history is provided by the patient. The history is limited by a language barrier. A language interpreter was used.       Past Medical History:  Diagnosis Date  . Asthma    h/o no inhalers  . Chest pain 01/20/2019  . Fistula-in-ano   . Headache    migraines    Patient Active Problem List   Diagnosis Date Noted  . GBS (group B Streptococcus carrier), +RV culture, currently pregnant 11/30/2020  . Lipoma 10/20/2020  . Supervision of high risk pregnancy, antepartum 06/09/2020  . AMA (advanced maternal age) multigravida 35+ 06/09/2020  . Language barrier 06/09/2020  . Dysmenorrhea 07/16/2019  . DUB (dysfunctional uterine bleeding) 07/16/2019    Past Surgical History:  Procedure Laterality Date  . NO PAST SURGERIES    . RECTAL EXAM UNDER ANESTHESIA N/A 01/23/2019   Procedure: RECTAL EXAM UNDER ANESTHESIA, with FISTULOTOMY and  FIBRIN GLUE;  Surgeon: Fredirick Maudlin, MD;  Location: [facility] ORS;  Service: General;  Laterality: N/A;     OB History    Gravida  6   Para  4   Term  4   Preterm  0   AB  1   Living  4     SAB  1   IAB  0   Ectopic  0   Multiple  0   Live Births  4           Family History  Problem Relation Age of Onset  . Hypertension Mother   . Asthma Mother   . Hypertension Father   . Cancer Maternal Uncle      Social History   Tobacco Use  . Smoking status: Never Smoker  . Smokeless tobacco: Never Used  Vaping Use  . Vaping Use: Never used  Substance Use Topics  . Alcohol use: No  . Drug use: No    Home Medications Prior to Admission medications   Medication Sig Start Date End Date Taking? Authorizing Provider  FOLIC ACID PO Take 1 tablet by mouth daily.    [provider]  metroNIDAZOLE (FLAGYL) 500 MG tablet Take 1 tablet (500 mg total) by mouth 2 (two) times daily. 11/30/20   Janet Berlin, MD  Prenatal Vit-Fe Fumarate-FA (PRENATAL MULTIVITAMIN) TABS tablet Take 1 tablet by mouth daily at 12 noon.    [provider]    Allergies    Patient has no known allergies.  Review of Systems   Review of Systems  All other systems reviewed and are negative.   Physical Exam Updated Vital Signs LMP 04/17/2020   Physical Exam Vitals and nursing note reviewed.  Constitutional:      General: She is not in acute distress.    Appearance: She is well-developed.  HENT:     Head: Normocephalic and atraumatic.  Eyes:  Conjunctiva/sclera: Conjunctivae normal.  Cardiovascular:     Rate and Rhythm: Normal rate.     Heart sounds: No murmur heard.   Pulmonary:     Effort: Pulmonary effort is normal. No respiratory distress.  Abdominal:     General: There is no distension.     Comments: gravid abdomen  Musculoskeletal:     Cervical back: Neck supple.     Comments: Moves all extremities  Skin:    General: Skin is warm and dry.  Neurological:     Mental Status: She is alert and oriented to person, place, and time.  Psychiatric:        Mood and Affect: Mood normal.        Behavior: Behavior normal.     ED Results / Procedures / Treatments   Labs (all labs ordered are listed, but only abnormal results are displayed) Labs Reviewed - No data to display  EKG None  Radiology No results found.  Procedures Procedures   Medications Ordered in  ED Medications - No data to display  ED Course  I have reviewed the triage vital signs and the nursing notes.  Pertinent labs & imaging results that were available during my care of the patient were reviewed by me and considered in my medical decision making (see chart for details).    MDM Rules/Calculators/A&P                          Patient here with abdominal cramps in pregnancy.  I discussed the case with the MAU APP, who states that the patient can be sent to MAU for evaluation.  It does not appear that the patient is about to have imminent delivery.  Vitals:   12/11/20 0042  BP: 130/89  Pulse: (!) 107  Resp: 18  Temp: 98.3 F (36.8 C)  SpO2: 100%    Final Clinical Impression(s) / ED Diagnoses Final diagnoses:  Abdominal pain in pregnancy, third trimester    Rx / DC Orders ED Discharge Orders    None       Montine Circle, PA-C 12/11/20 0043    Ripley Fraise, MD 12/11/20 0500

## 2020-12-11 NOTE — Anesthesia Preprocedure Evaluation (Signed)
Anesthesia Evaluation  Patient identified by MRN, date of birth, ID band Patient awake    Reviewed: Allergy & Precautions, Patient's Chart, lab work & pertinent test results  Airway Mallampati: II  TM Distance: >3 FB Neck ROM: Full    Dental no notable dental hx. (+) Teeth Intact   Pulmonary asthma ,    Pulmonary exam normal breath sounds clear to auscultation       Cardiovascular negative cardio ROS Normal cardiovascular exam Rhythm:Regular Rate:Normal     Neuro/Psych  Headaches, negative psych ROS   GI/Hepatic Neg liver ROS, GERD  ,  Endo/Other  Obesity  Renal/GU negative Renal ROS  negative genitourinary   Musculoskeletal negative musculoskeletal ROS (+)   Abdominal (+) + obese,   Peds  Hematology negative hematology ROS (+)   Anesthesia Other Findings   Reproductive/Obstetrics (+) Pregnancy AMA                             Anesthesia Physical Anesthesia Plan  ASA: II  Anesthesia Plan: Epidural   Post-op Pain Management:    Induction:   PONV Risk Score and Plan:   Airway Management Planned: Natural Airway  Additional Equipment:   Intra-op Plan:   Post-operative Plan:   Informed Consent: I have reviewed the patients History and Physical, chart, labs and discussed the procedure including the risks, benefits and alternatives for the proposed anesthesia with the patient or authorized representative who has indicated his/her understanding and acceptance.       Plan Discussed with: Anesthesiologist  Anesthesia Plan Comments:         Anesthesia Quick Evaluation

## 2020-12-11 NOTE — MAU Note (Signed)
Pt reports contractions and ? Leaking fluid.

## 2020-12-11 NOTE — Discharge Summary (Signed)
Postpartum Discharge Summary  Date of Service updated 12/12/20     Patient Name: Regina Shea DOB: 19-Jul-1981 MRN: [id number]  Date of admission: 12/11/2020 Delivery date:12/11/2020  Delivering provider: [name]  Date of discharge: 12/12/2020  Admitting diagnosis: Encounter for induction of labor [Z34.90] Intrauterine pregnancy: [redacted]w[redacted]d    Secondary diagnosis:  Active Problems:   Supervision of high risk pregnancy, antepartum   AMA (advanced maternal age) multigravida 35+   Language barrier   GBS (group B Streptococcus carrier), +RV culture, currently pregnant   Encounter for induction of labor   Vaginal delivery  Additional problems: none    Discharge diagnosis: Term Pregnancy Delivered and AMA                                              Post partum procedures:n/a Augmentation: AROM and Cytotec Complications: None  Hospital course: Induction of Labor With Vaginal Delivery   40y.o. yo [id number] admitted to the hospital 12/11/2020 for induction of labor.  Indication for induction: Elective.  Patient had an uncomplicated labor course as follows: Membrane Rupture Time/Date: 4:40 PM ,12/11/2020   Delivery Method:Vaginal, Spontaneous  Episiotomy: None  Lacerations:  None  Details of delivery can be found in separate delivery note.  Patient had a routine postpartum course. Patient is discharged home 12/12/20.  Newborn Data: Birth date:12/11/2020  Birth time:5:07 PM  Gender:Female  Living status:Living  Apgars:8 ,9  Weight:3535 g   Magnesium Sulfate received: No BMZ received: No Rhophylac:N/A MMR:N/A T-DaP:Given prenatally Flu: Yes Transfusion:No  Physical exam  Vitals:   12/11/20 1930 12/11/20 2330 12/12/20 0330 12/12/20 0839  BP: 128/78 117/66 116/78 120/75  Pulse: 87 95 85 77  Resp: 18 16 18 18   Temp: 98.1 F (36.7 C) 98.1 F (36.7 C) 98 F (36.7 C) 97.7 F (36.5 C)  TempSrc: Oral Oral Oral Oral  SpO2: 100%   99%   General:  alert, cooperative and no distress Lochia: appropriate Uterine Fundus: firm Incision: N/A DVT Evaluation: No evidence of DVT seen on physical exam. Labs: Lab Results  Component Value Date   WBC 7.6 12/11/2020   HGB 13.2 12/11/2020   HCT 39.7 12/11/2020   MCV 85.9 12/11/2020   PLT 209 12/11/2020   CMP Latest Ref Rng & Units 12/06/2020  Glucose 65 - 99 mg/dL 128(H)  BUN 6 - 20 mg/dL 8  Creatinine 0.57 - 1.00 mg/dL 0.54(L)  Sodium 134 - 144 mmol/L 136  Potassium 3.5 - 5.2 mmol/L 3.8  Chloride 96 - 106 mmol/L 102  CO2 20 - 29 mmol/L 17(L)  Calcium 8.7 - 10.2 mg/dL 9.4  Total Protein 6.0 - 8.5 g/dL 6.2  Total Bilirubin 0.0 - 1.2 mg/dL 0.3  Alkaline Phos 44 - 121 IU/L 195(H)  AST 0 - 40 IU/L 22  ALT 0 - 32 IU/L 18   Edinburgh Score: No flowsheet data found.   After visit meds:  Allergies as of 12/12/2020   No Known Allergies     Medication List    STOP taking these medications   FOLIC ACID PO   metroNIDAZOLE 500 MG tablet Commonly known as: FLAGYL     TAKE these medications   acetaminophen 500 MG tablet Commonly known as: TYLENOL Take 2 tablets (1,000 mg total) by mouth every 6 (six) hours as needed (for pain scale <  4).   coconut oil Oil Apply 1 application topically as needed.   ibuprofen 600 MG tablet Commonly known as: ADVIL Take 1 tablet (600 mg total) by mouth every 6 (six) hours as needed.   prenatal multivitamin Tabs tablet Take 1 tablet by mouth daily at 12 noon.        Discharge home in stable condition Infant Feeding: Bottle and Breast Infant Disposition:home with mother Discharge instruction: per After Visit Summary and Postpartum booklet. Activity: Advance as tolerated. Pelvic rest for 6 weeks.  Diet: routine diet Future Appointments: Future Appointments  Date Time Provider [place]  01/09/2021 10:15 AM Starr Lake, CNM [facility]   Follow up Visit:   Please schedule this patient for a In person postpartum  visit in 4 weeks with the following provider: Any provider. Additional Postpartum F/U:None  High risk pregnancy complicated by: AMA Delivery mode:  Vaginal, Spontaneous  Anticipated Birth Control:  Depo and Vasectomy   5/73/2202 Arrie Senate, MD

## 2020-12-11 NOTE — Progress Notes (Signed)
Regina Shea is a 40 y.o. [id number] at [redacted]w[redacted]d.  Subjective: Uncomfortable w/ UC's. Requesting IV pain meds.   Objective: BP 137/78   Pulse 81   Temp 98.3 F (36.8 C) (Axillary)   Resp 16   LMP 04/17/2020    FHT:  FHR: 135 bpm, variability: mod,  accelerations:  15x15,  decelerations:  none UC:   Q 2-7 minutes, moderate Dilation: 2 Effacement (%): 60 Cervical Position: Posterior Station: -3 Presentation: Vertex Exam by:: [facility] [name], cnm  Labs: Results for orders placed or performed during the hospital encounter of 12/11/20 (from the past 24 hour(s))  CBC     Status: None   Collection Time: 12/11/20  7:15 AM  Result Value Ref Range   WBC 7.6 4.0 - 10.5 K/uL   RBC 4.62 3.87 - 5.11 MIL/uL   Hemoglobin 13.2 12.0 - 15.0 g/dL   HCT 39.7 36.0 - 46.0 %   MCV 85.9 80.0 - 100.0 fL   MCH 28.6 26.0 - 34.0 pg   MCHC 33.2 30.0 - 36.0 g/dL   RDW 13.7 11.5 - 15.5 %   Platelets 209 150 - 400 K/uL   nRBC 0.0 0.0 - 0.2 %  Type and screen     Status: None   Collection Time: 12/11/20  7:15 AM  Result Value Ref Range   ABO/RH(D) A POS    Antibody Screen NEG    Sample Expiration      12/14/2020,2359 Performed at [facility], [place] [address], [place], [place] [address]   RPR     Status: None   Collection Time: 12/11/20  7:15 AM  Result Value Ref Range   RPR Ser Ql NON REACTIVE NON REACTIVE    Assessment / Plan: [redacted]w[redacted]d week IUP Labor: Early, continue Cytotec Fetal Wellbeing:  Category I Pain Control:  Fentanyl Anticipated MOD:  SVD  [name], [place] 12/11/2020 2:43 PM

## 2020-12-11 NOTE — ED Triage Notes (Signed)
Pt is [redacted] weeks pregnant and has been having back pain for two days, contractions started 2 hrs ago, 64min apart that are coming quicker.

## 2020-12-12 DIAGNOSIS — O99825 Streptococcus B carrier state complicating the puerperium: Secondary | ICD-10-CM

## 2020-12-12 MED ORDER — COCONUT OIL OIL: 1.0000 "application " | TOPICAL_OIL | 0 refills | Status: DC | PRN

## 2020-12-12 MED ORDER — ACETAMINOPHEN 500 MG PO TABS: 1000.0000 mg | ORAL_TABLET | Freq: Four times a day (QID) | ORAL | Status: DC | PRN

## 2020-12-12 MED ORDER — IBUPROFEN 600 MG PO TABS: 600.0000 mg | ORAL_TABLET | Freq: Four times a day (QID) | ORAL | 0 refills | Status: DC | PRN

## 2020-12-12 NOTE — Anesthesia Postprocedure Evaluation (Deleted)
Anesthesia Post Note  Patient: Regina Shea  Procedure(s) Performed: AN AD HOC LABOR EPIDURAL     Patient location during evaluation: Mother Baby Anesthesia Type: Epidural Level of consciousness: awake Pain management: satisfactory to patient Vital Signs Assessment: post-procedure vital signs reviewed and stable Respiratory status: spontaneous breathing Cardiovascular status: stable Anesthetic complications: no   No complications documented.  Last Vitals:  Vitals:   12/12/20 0330 12/12/20 0839  BP: 116/78 120/75  Pulse: 85 77  Resp: 18 18  Temp: 36.7 C 36.5 C  SpO2:  99%    Last Pain:  Vitals:   12/12/20 1112  TempSrc:   PainSc: 0-No pain   Pain Goal: Patients Stated Pain Goal: 0 (12/12/20 0100)                 Casimer Lanius

## 2020-12-12 NOTE — Lactation Note (Signed)
This note was copied from a baby's chart. Lactation Consultation Note  Patient Name: Regina Shea [id number] Date: 12/12/2020 Reason for consult: Follow-up assessment;Term;1st time breastfeeding Age:40 hours   P5 mother whose infant is now 51 hours old.  This is a term baby at 39+3 weeks.  Mother did not breast feed any of the rest of her children.  In house Spanish interpreter not available at this time.  [place], [phone]) used for interpretation.  Mother wincing in pain as I arrived.  Observed baby to be latched on the nipple tip only.  Removed baby from breast.  Mother reported he had not eaten in over 4 hours.  He was very fussy.  Asked mother to hand express colostrum drops which I finger fed to him.  He had a very strong "bite" and was hard to console.  Provided a spoon for EBM and mother collected approximately 8 mls which I spoon fed back to him.  After spoon feeding he was more consolable and latched to the breast.  He continued to "fight" the breast after latching and was hard to calm.  Continued working with him and he finally began to calm and suck with more rhythmic jaw extensions.  Mother happy to report no pain at all with a deeper latch and a calmer baby.  Observed him feeding for 15 minutes while reviewing breast feeding basics with mother.  Placed him STS after completion and mother happy.  Suggested mother begin observing for feeding cues closer to every three hours due to baby becoming very hard to latch when hungry.  Mother can easily express colostrum from both breasts.  Encouraged her to continue to practice hand expression and to feed colostrum drops prior to latching.  Mother in agreement.  No support person present at this time.  RN updated.   Maternal Data Has patient been taught Hand Expression?: Yes Does the patient have breastfeeding experience prior to this delivery?: No  Feeding Mother's Current Feeding Choice: Breast Milk and Formula  LATCH  Score Latch: Grasps breast easily, tongue down, lips flanged, rhythmical sucking.  Audible Swallowing: A few with stimulation  Type of Nipple: Everted at rest and after stimulation (short shafted)  Comfort (Breast/Nipple): Soft / non-tender  Hold (Positioning): Assistance needed to correctly position infant at breast and maintain latch.  LATCH Score: 8   Lactation Tools Discussed/Used    Interventions Interventions: Breast feeding basics reviewed;Assisted with latch;Skin to skin;Breast massage;Hand express;Breast compression;Adjust position;Position options;Support pillows;Education  Discharge    Consult Status Consult Status: Follow-up Date: 12/13/20 Follow-up type: In-patient    Little Ishikawa 12/12/2020, 12:18 PM

## 2020-12-12 NOTE — Discharge Instructions (Signed)
Parto vaginal, cuidados de puerperio Postpartum Care After Vaginal Delivery La siguiente informacin ofrece una gua sobre cmo cuidarse desde el momento en que nazca su beb y hasta 6 a 12 semanas despus del parto (perodo del posparto). Si tiene problemas o preguntas, pngase en contacto con su mdico para obtener instrucciones ms especficas. Siga estas instrucciones en su casa: Hemorragia vaginal  Es normal tener un poco de hemorragia vaginal (loquios) despus del parto. Use un apsito sanitario para el sangrado y secrecin. ? Durante la primera semana despus del parto, la cantidad y el aspecto de los loquios a menudo es similar a los del perodo menstrual. ? Durante las siguientes semanas disminuir gradualmente hasta convertirse en una secrecin seca amarronada o amarillenta. ? En la mayora de las mujeres, los loquios se detienen completamente entre 4 a 6semanas despus del parto, pero esto puede variar.  Cambie los apsitos sanitarios con frecuencia. Observe si hay cambios en el flujo, como: ? Aumento repentino en el volumen. ? Cambio en el color. ? Cogulos de sangre grandes.  Si expulsa un cogulo de sangre por la vagina, gurdelo y llame al mdico. No deseche los cogulos de sangre por el inodoro antes de hablar con su mdico.  No use tampones ni se haga duchas vaginales hasta que el mdico la autorice.  Si no est amamantando, volver a tener su perodo entre 6 y 8 semanas despus del parto. Si solamente alimenta al beb con leche materna, podra no volver a tener su perodo hasta que deje de amamantar. Cuidados perineales  Mantenga la zona entre la vagina y el ano (perineo) limpia y seca. Utilice apsitos o aerosoles analgsicos y cremas, como se lo hayan indicado.  Si le hicieron un corte quirrgico en el perineo (episiotoma) o tuvo un desgarro, controle la zona para detectar signos de infeccin hasta que sane. Est atenta a los siguientes signos: ? Aumento del  enrojecimiento, la hinchazn o el dolor. ? Presenta lquido o sangre que supura del corte o desgarro. ? Calor. ? Pus o mal olor.  Es posible que le den una botella rociadora para que use en lugar de limpiarse el rea con papel higinico despus de usar el bao. Seque la zona dando golpecitos suaves.  Para aliviar el dolor causado por una episiotoma, un desgarro o venas hinchadas en el ano (hemorroides), tome un bao de asiento tibio 2 o 3 veces por da. En un bao de asiento, el agua tibia solamente debe llegar hasta las caderas y cubrir las nalgas.   Cuidado de las mamas  En los primeros das despus del parto, las mamas pueden sentirse pesadas, llenas e incmodas (congestin mamaria). Tambin puede escaparse leche de sus senos. Pdale al mdico que le sugiera formas de aliviar el malestar.  Si est amamantando: ? Use un sostn que sujete las mamas y ajuste bien. Utilice protectores mamarios para absorber la leche que se filtre. ? Mantenga los pezones secos y limpios. Aplique cremas y ungentos como se lo hayan indicado. ? Puede tener contracciones uterinas cada vez que amamante durante varias semanas despus del parto. Esto ayuda a que el tero vuelva a su tamao normal. ? Si tiene algn problema con la lactancia materna, infrmelo al mdico o a un asesor en lactancia.  Si no est amamantando: ? Evite tocarse las mamas. No extraiga (saque) leche materna. Al hacerlo, podran producir ms leche. ? Use un sostn que le proporcione el ajuste correcto y compresas fras para reducir la hinchazn. Intimidad y sexualidad    Pregntele al mdico cundo puede retomar la actividad sexual. Esto puede depender de lo siguiente: ? Su riesgo de sufrir infecciones. ? La rapidez con la que est sanando. ? Su comodidad y deseo de retomar la actividad sexual.  Despus del parto, puede quedar embarazada incluso si no ha tenido todava su perodo. Hable con el mdico acerca de los mtodos de control de la  natalidad (mtodos anticonceptivos) o planificacin familiar si desea tener embarazos en el futuro. Medicamentos  Use los medicamentos de venta libre y los recetados solamente como se lo haya indicado el mdico.  Tome un laxante de venta libre para ayudar con las deposiciones como se lo haya indicado el mdico.  Si le recetaron un antibitico, tmelo como se lo haya indicado el mdico. No deje de tomar el antibitico aunque comience a sentirse mejor.  Revise todos los medicamentos con receta anteriores y actuales para comprobar la posible transferencia a la leche materna. Actividad  Retome sus actividades normales de a poco segn lo indicado por el mdico.  Descanse todo lo que pueda. Tome siestas mientras el beb duerme. Comida y bebida  Beba suficiente lquido como para mantener la orina de color amarillo plido.  Para ayudar a prevenir o aliviar el estreimiento, coma alimentos ricos en fibra todos los das.  Elija una alimentacin saludable para ayudar a la lactancia o los objetivos de prdida de peso.  Tome sus vitaminas prenatales hasta que su mdico le indique que deje de hacerlo.   Recomendaciones/consejos generales  No consuma ningn producto que contenga nicotina o tabaco. Estos productos incluyen cigarrillos, tabaco para mascar y aparatos de vapeo, como los cigarrillos electrnicos. Si necesita ayuda para dejar de fumar, consulte al mdico.  No beba alcohol, especialmente si est amamantando.  No tome medicamentos o frmacos que no se le receten, especialmente si est en perodo de lactancia.  Visite al mdico para un control de posparto dentro de las primeras 3 a 6 semanas despus del parto.  Realice una visita posparto integral a ms tardar 12 semanas despus del parto.  Asista a todas las visitas de seguimiento para usted y su beb. Comunquese con un mdico si:  Siente tristeza o preocupacin de forma inusual.  Las mamas se ponen rojas, le duelen o se  endurecen.  Tiene fiebre u otros signos de infeccin.  Tiene sangrado que est empapando una compresa por hora o tiene cogulos de sangre.  Siente un dolor de cabeza intenso que no se alivia o tiene cambios en la visin.  Tiene nuseas y vmitos y no puede comer o beber nada durante 24horas. Solicite ayuda de inmediato si:  Tiene dolor en el pecho o dificultad para respirar.  Tiene un dolor repentino e intenso en la pierna.  Tiene una convulsin o se desmaya.  Tiene pensamientos acerca de lastimarse a usted misma o a su beb. Si alguna vez siente que puede lastimarse o lastimar a otras personas, o tiene pensamientos de poner fin a su vida, busque ayuda de inmediato. Dirjase al servicio de urgencias ms cercano o:  Comunquese con el servicio de emergencias de su localidad (911 en los [place]).  National Suicide Prevention Lifeline (Lnea Telefnica Nacional para la Prevencin del Suicidio) al 1-800-273-8255. Esta lnea de asistencia al suicida est abierta las 24 horas del da.  Enve un mensaje de texto a la lnea para casos de crisis al [id number] (en los [place] Resumen  El perodo de tiempo despus el parto y hasta 6 a 12 semanas despus   del parto se denomina perodo posparto.  Asista a todas las visitas de seguimiento para usted y su beb.  Revise todos los medicamentos con receta anteriores y actuales para comprobar la posible transferencia a la leche materna.  Pngase en contacto con un mdico si se siente inusualmente triste o preocupada durante el perodo posparto. Esta informacin no tiene como fin reemplazar el consejo del mdico. Asegrese de hacerle al mdico cualquier pregunta que tenga. Document Revised: 05/19/2020 Document Reviewed: 04/26/2020 Elsevier Patient Education  2021 Elsevier Inc.  

## 2020-12-12 NOTE — Anesthesia Postprocedure Evaluation (Signed)
Anesthesia Post Note  Patient: Regina Shea  Procedure(s) Performed: AN AD HOC LABOR EPIDURAL     Patient location during evaluation: Mother Baby Anesthesia Type: Epidural Level of consciousness: awake Pain management: satisfactory to patient Vital Signs Assessment: post-procedure vital signs reviewed and stable Respiratory status: spontaneous breathing Cardiovascular status: stable Anesthetic complications: no   No complications documented.  Last Vitals:  Vitals:   12/12/20 0330 12/12/20 0839  BP: 116/78 120/75  Pulse: 85 77  Resp: 18 18  Temp: 36.7 C 36.5 C  SpO2:  99%    Last Pain:  Vitals:   12/12/20 1112  TempSrc:   PainSc: 0-No pain   Pain Goal: Patients Stated Pain Goal: 0 (12/12/20 0100)       Report from [place]

## 2020-12-12 NOTE — Lactation Note (Signed)
This note was copied from a baby's chart. Lactation Consultation Note  Patient Name: Regina Shea [id number] Date: 12/12/2020 Reason for consult: Initial assessment;Term Age:40 hours  Spanish Interpreter used  P5, term female infant. Infant had one void and one stool since birth.  Per mom, she only breastfeed her 4 th child for less than one month due to infant not latching at the breast. Mom was given breast shells, hand pump, 24 mm NS and set up with DEBP by [facility]- RN Lorriane Shire), mom has nipples that when stimulated becomes flat. [place] asked mom pre-pump breast prior to latching infant, mom latched infant on her left breast, infant latched but did not suckle due to receiving high volume of formula prior to [facility] entering the room, LATCH-5. Infant  received 30 mls of formula by dad, LC discussed with parent's infant small tummy size. Mom given form with breastfeeding supplementation, Mom knows if she latches infant at the breast to limit supplementation to 5-7 mls per feeding, if infant doesn't latch to offer 5 to 15 mls per feeding of EBM or formula. Mom will offer her EBM first before supplementing infant with formula. LC discussed infant's input and output with parents. Mom shown how to use DEBP & how to disassemble, clean, & reassemble parts. Mom made aware of O/P services, breastfeeding support groups, community resources, and our phone # for post-discharge questions.   Mom's plan: 1- Mom will breastfeed infant according to feeding cues, 8 to 12 or more times within 24 hours, STS. 2- Mom will continue to work towards latching infant at the breast and mom will ask RN or [place] for assistance with latch. 3- Mom will pre-pump breast prior to latching infant at the breast she will attempt to latch infant without 24 mm NS at first. 4- Mom will give infant her EBM that she pumped first before offering formula. 5- Mom will pump every 3 hours for 15 minutes on initial setting. 6- Mom will wear  breast shells in bra during the day and not at night nor while sleeping to help evert nipple shaft out more to help with latch.  Mom  Maternal Data Has patient been taught Hand Expression?: Yes Does the patient have breastfeeding experience prior to this delivery?: Yes How long did the patient breastfeed?: Mom attempted to BF her other children none of them latch, mom has breast that when stimulated they become flat, the longest she BF was her 4th child for less than one month.  Feeding Mother's Current Feeding Choice: Breast Milk and Formula  LATCH Score Latch: Too sleepy or reluctant, no latch achieved, no sucking elicited.  Audible Swallowing: None  Type of Nipple: Everted at rest and after stimulation (becomes flat when stimulated, was given 24 mm NS and breast shells  by RN Lorriane Shire))  Comfort (Breast/Nipple): Soft / non-tender  Hold (Positioning): Assistance needed to correctly position infant at breast and maintain latch.  LATCH Score: 5   Lactation Tools Discussed/Used Tools: Pump;Nipple [place] [employer];Shells Nipple shield size: 24 Breast pump type: Double-Electric Breast Pump Pump Education: Setup, frequency, and cleaning;Milk Storage Reason for Pumping: To help stimulate and establish milk supply, infant not latching at the breast and mom having flat nipples when compressed Pumping frequency: Mom knows to pump every 3 hours for 15 minutes on inital setting.  Interventions Interventions: Breast feeding basics reviewed;Assisted with latch;Skin to skin;Pre-pump if needed;Breast compression;Adjust position;Support pillows;Position options;Hand pump;DEBP;Education  Discharge Pump: DEBP;Manual WIC Program: Yes  Consult Status Consult Status:  Follow-up Date: 12/12/20 Follow-up type: In-patient    Vicente Serene 12/12/2020, 12:08 AM

## 2020-12-14 ENCOUNTER — Encounter: Payer: Self-pay | Admitting: Certified Nurse Midwife

## 2020-12-21 ENCOUNTER — Encounter: Payer: Self-pay | Admitting: Certified Nurse Midwife

## 2020-12-26 IMAGING — US US MFM OB DETAIL+14 WK
1 series · 13 of 28 positions shown · non-contrast
Comparison: none

[Series 1: us mfm ob detail+14 wk · 13 of 157 slices shown]
[im 6/157]
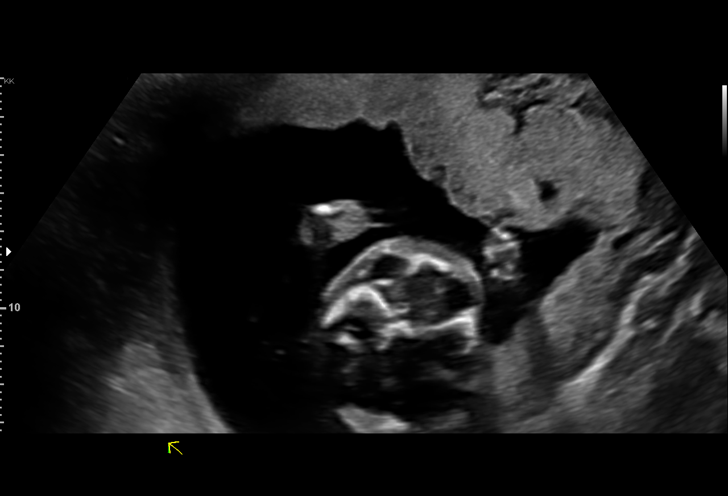
[im 18/157]
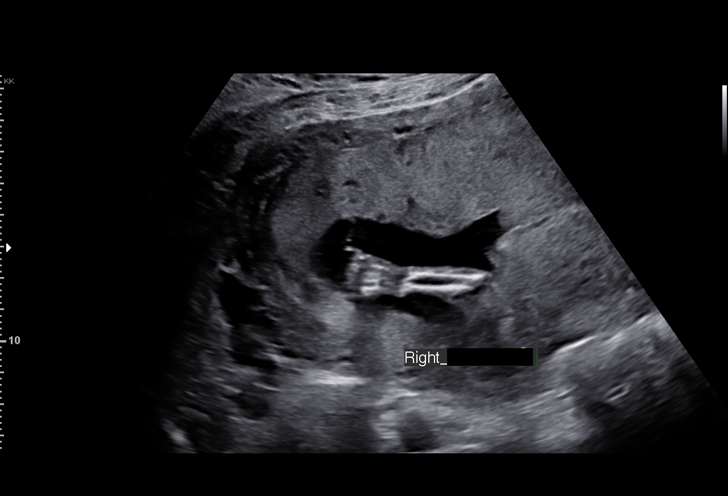
[im 29/157]
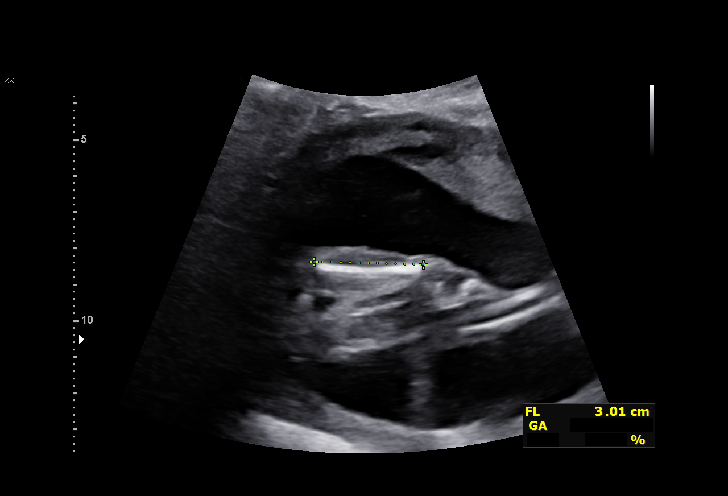
[im 41/157]
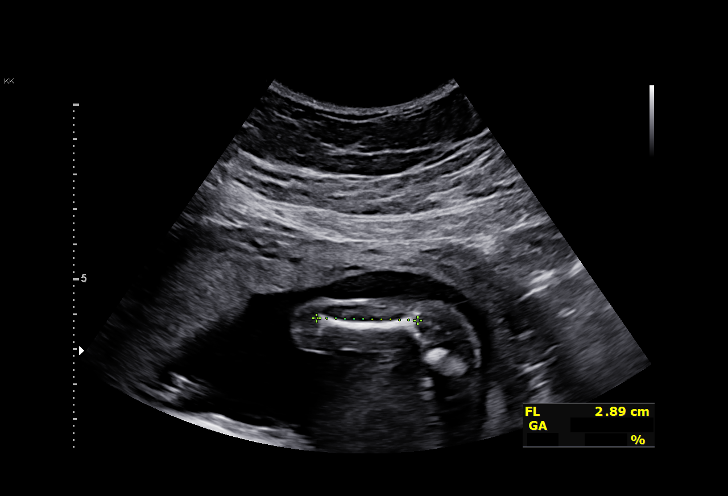
[im 53/157]
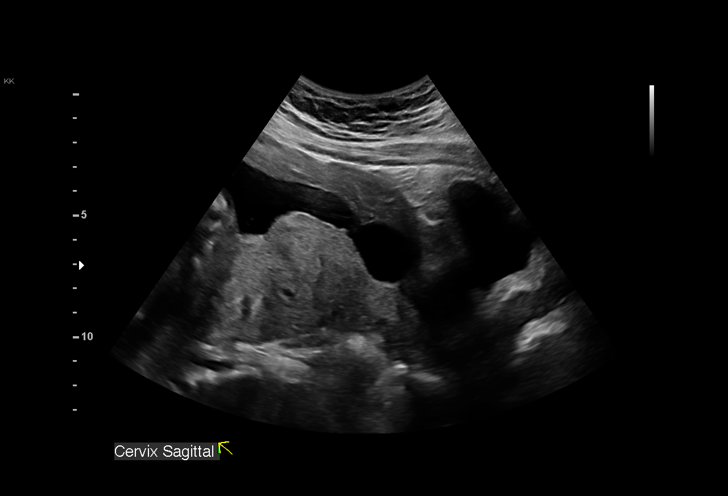
[im 64/157]
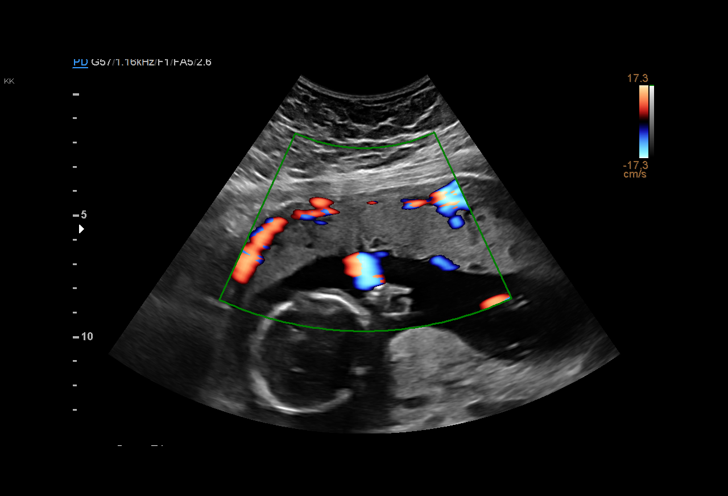
[im 81/157]
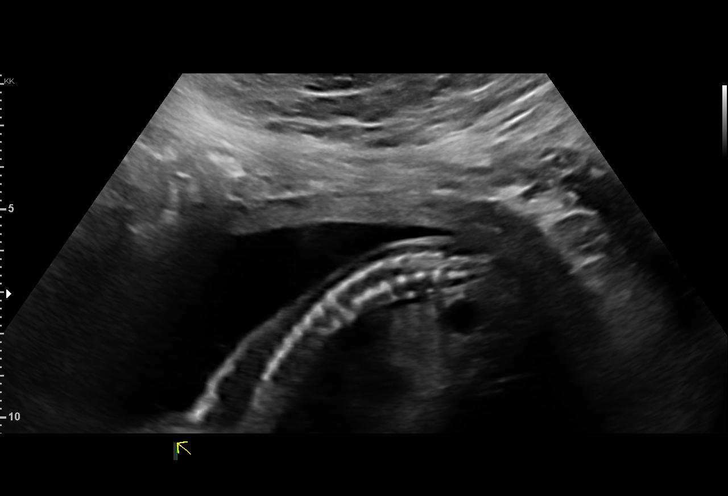
[im 93/157]
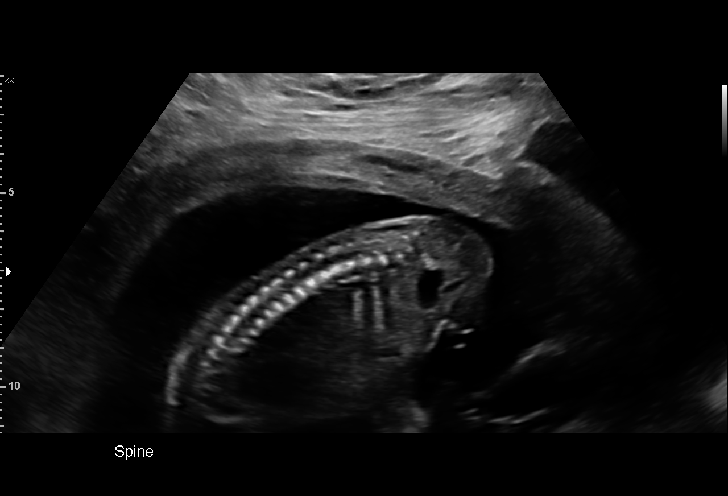
[im 105/157]
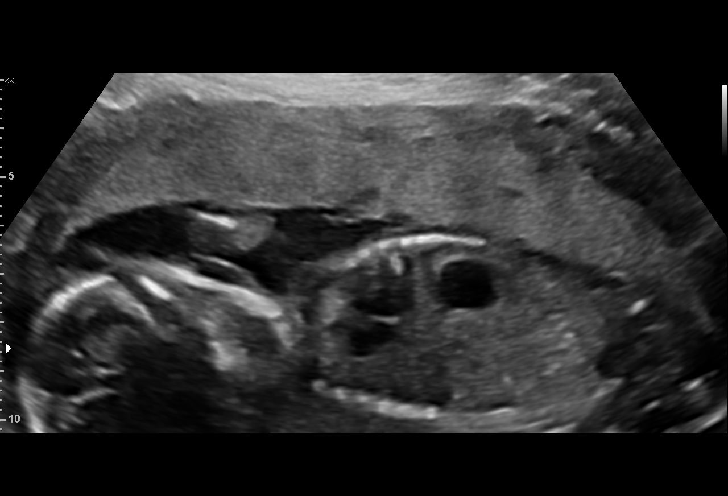
[im 116/157]
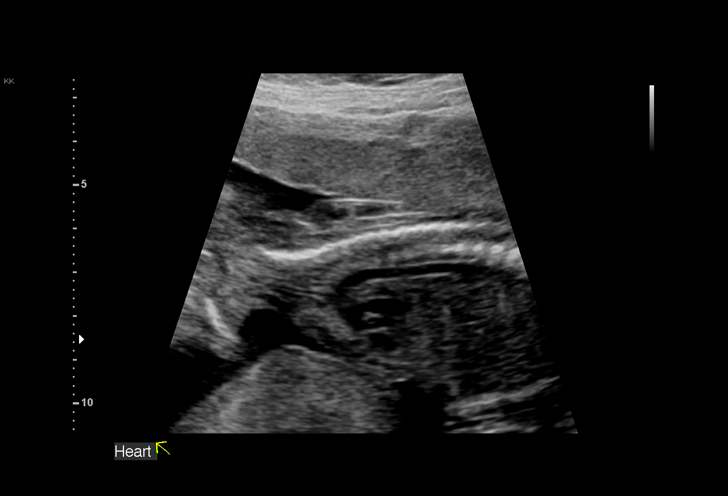
[im 128/157]
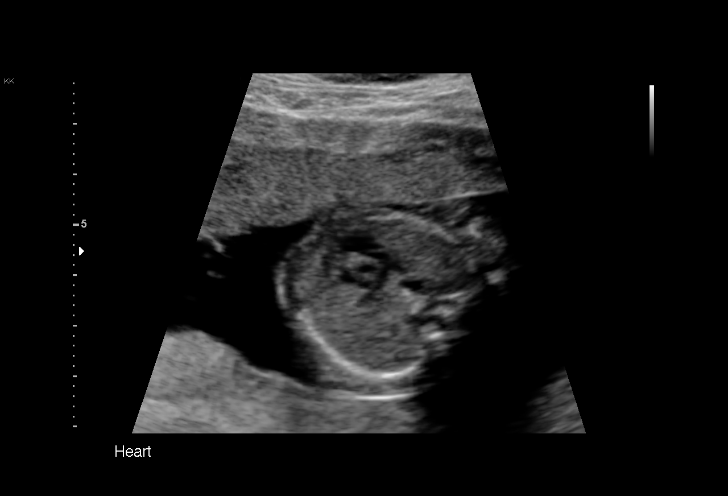
[im 139/157]
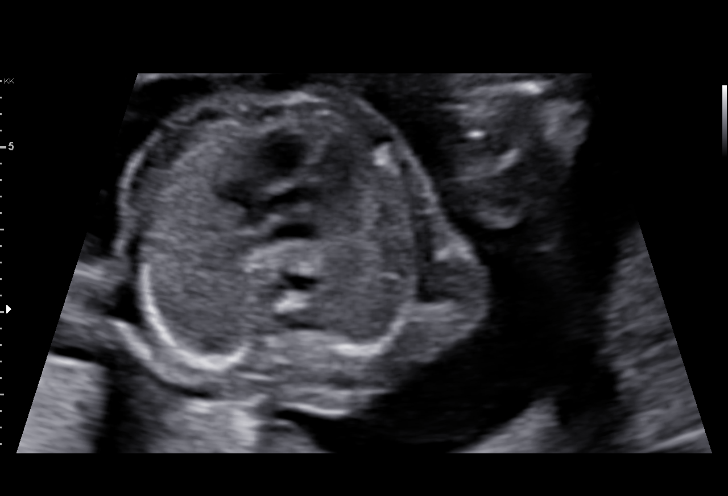
[im 151/157]
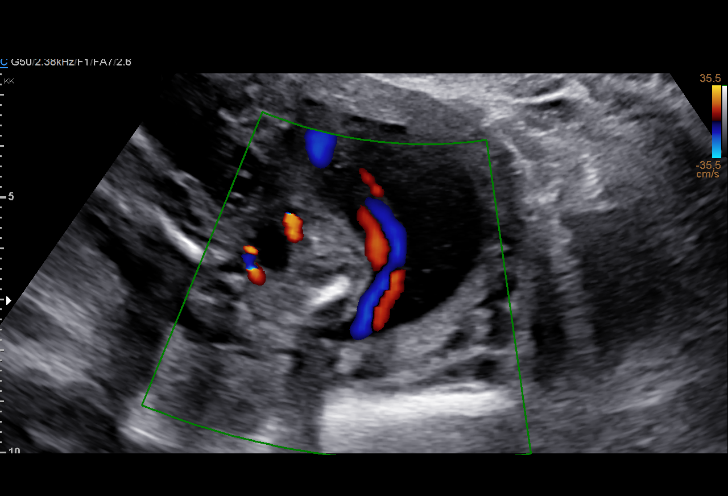

[13 of 28 positions shown; findings below may reference images not displayed]

ANAND

Indications

 Advanced maternal age multigravida 35+,
 second trimester(39 yrs)
 19 weeks gestation of pregnancy
 Low Risk NIPS(Negative AFP)(Negative
 Horizon)
Fetal Evaluation

 Num Of Fetuses:         1
 Fetal Heart Rate(bpm):  152
 Cardiac Activity:       Observed
 Presentation:           Transverse, head to maternal right
 Placenta:               Anterior
 P. Cord Insertion:      Visualized

 Amniotic Fluid
 AFI FV:      Within normal limits

                             Largest Pocket(cm)

Biometry

 BPD:      44.9  mm     G. Age:  19w 4d         75  %    CI:        74.28   %    70 - 86
                                                         FL/HC:      17.8   %    16.1 -
 HC:      165.4  mm     G. Age:  19w 2d         55  %    HC/AC:      1.18        1.09 -
 AC:       140   mm     G. Age:  19w 3d         59  %    FL/BPD:     65.5   %
 FL:       29.4  mm     G. Age:  19w 0d         45  %    FL/AC:      21.0   %    20 - 24
 HUM:      28.9  mm     G. Age:  19w 3d         60  %
 NFT:       3.4  mm

 Est. FW:     282  gm    0 lb 10 oz      61  %
OB History

 Gravidity:    6         Term:   4         SAB:   1
 Living:       4
Gestational Age

 LMP:           13w 4d        Date:  04/17/20                 EDD:   01/22/21
 U/S Today:     19w 2d                                        EDD:   12/13/20
 Best:          19w 0d     Det. By:  Previous Ultrasound      EDD:   12/15/20
                                     (05/18/20)
Anatomy

 Cranium:               Appears normal         Aortic Arch:            Appears normal
 Cavum:                 Appears normal         Ductal Arch:            Appears normal
 Ventricles:            Appears normal         Diaphragm:              Appears normal
 Choroid Plexus:        Appears normal         Stomach:                Appears normal, left
                                                                       sided
 Cerebellum:            Appears normal         Abdomen:                Appears normal
 Posterior Fossa:       Appears normal         Abdominal Wall:         Appears nml (cord
                                                                       insert, abd wall)
 Nuchal Fold:           Appears normal         Cord Vessels:           Appears normal (3
                                                                       vessel cord)
 Face:                  Appears normal         Kidneys:                Appear normal
                        (orbits and profile)
 Lips:                  Appears normal         Bladder:                Appears normal
 Thoracic:              Appears normal         Spine:                  Appears normal
 Heart:                 Appears normal         Upper Extremities:      Appears normal
                        (4CH, axis, and
                        situs)
 RVOT:                  Appears normal         Lower Extremities:      Appears normal
 LVOT:                  Appears normal

 Other:  Fetus a male. Technically difficult due to maternal habitus and fetal
         position.
Cervix Uterus Adnexa

 Cervix
 Length:           3.02  cm.
 Normal appearance by transabdominal scan.
Comments

 This patient was seen for a detailed fetal anatomy scan due
 to advanced maternal age.  She denies any past significant
 history and denies any problems in her current pregnancy.
 She reports a history of 4 prior full-term uncomplicated
 vaginal deliveries.
 She had a cell free DNA test earlier in her pregnancy which
 indicated a low risk for trisomy 21, 18, and 13. A male fetus is
 predicted.
 She was informed that the fetal growth and amniotic fluid
 level were appropriate for her gestational age.
 There were no obvious fetal anomalies noted on today's
 ultrasound exam.
 The patient was informed that anomalies may be missed due
 to technical limitations. If the fetus is in a suboptimal position
 or maternal habitus is increased, visualization of the fetus in
 the maternal uterus may be impaired.
 The increased risk of fetal aneuploidy due to advanced
 maternal age was discussed. Due to advanced maternal age,
 the patient was offered and declined an amniocentesis today
 for definitive diagnosis of fetal aneuploidy.
 Due to advanced maternal age, a follow-up growth scan was
 scheduled in 6 weeks.
 All conversations were held with the patient today with the
 help of a Spanish interpreter.

## 2021-01-09 ENCOUNTER — Ambulatory Visit: Payer: Self-pay | Admitting: Student

## 2021-04-26 ENCOUNTER — Encounter: Payer: Self-pay | Admitting: General Surgery

## 2021-05-24 ENCOUNTER — Encounter: Payer: Self-pay | Admitting: Nurse Practitioner

## 2021-05-24 ENCOUNTER — Ambulatory Visit: Payer: Self-pay | Attending: Nurse Practitioner | Admitting: Nurse Practitioner

## 2021-05-24 ENCOUNTER — Other Ambulatory Visit: Payer: Self-pay

## 2021-05-24 VITALS — BP 129/89 | HR 87 | Resp 16 | Wt 192.4 lb

## 2021-05-24 DIAGNOSIS — G43109 Migraine with aura, not intractable, without status migrainosus: Secondary | ICD-10-CM

## 2021-05-24 DIAGNOSIS — R635 Abnormal weight gain: Secondary | ICD-10-CM

## 2021-05-24 DIAGNOSIS — R7309 Other abnormal glucose: Secondary | ICD-10-CM

## 2021-05-24 NOTE — Progress Notes (Signed)
Assessment & Plan:  Diagnoses and all orders for this visit:  Migraine with aura and without status migrainosus, not intractable She will send mychart message once she obtains the name of the medication she was prescribed in the past.   Weight gain -     Thyroid Panel With TSH  Elevated glucose -     CMP14+EGFR -     Hemoglobin A1c   Patient has been counseled on age-appropriate routine health concerns for screening and prevention. These are reviewed and up-to-date. Referrals have been placed accordingly. Immunizations are up-to-date or declined.    Subjective:  No chief complaint on file.  HPI Regina Shea 40 y.o. female presents to office today to re establish care after having delivering a baby.  VRI was used to communicate directly with patient for the entire encounter including providing detailed patient instructions.    She is concerned about weight gain. She has gained 10 lbs over the past 2 years however she is not exercising to lose weight at this time. She feels her metabolism has slowed down as well.  States she was told her glucose was elevated however last glucose level was 123.  Migraines Previously diagnosed with "emotional migraines" when she was in [place].  She does not know the name of the meds she took there but states they did help. She fears that she has an aneurysm and wants to be evaluated by Neurology. Associated symptoms: Photophobia. She denies any other associated symptoms.  Migraines can last up to 2 weeks.     Review of Systems  Constitutional:  Negative for fever, malaise/fatigue and weight loss.  HENT: Negative.  Negative for nosebleeds.   Eyes: Negative.  Negative for blurred vision, double vision and photophobia.  Respiratory: Negative.  Negative for cough and shortness of breath.   Cardiovascular: Negative.  Negative for chest pain, palpitations and leg swelling.  Gastrointestinal: Negative.  Negative for heartburn, nausea and  vomiting.  Musculoskeletal: Negative.  Negative for myalgias.  Neurological:  Positive for headaches. Negative for dizziness, focal weakness and seizures.  Psychiatric/Behavioral: Negative.  Negative for suicidal ideas.    Past Medical History:  Diagnosis Date   Asthma    h/o no inhalers   Chest pain 01/20/2019   Fistula-in-ano    Headache    migraines    Past Surgical History:  Procedure Laterality Date   NO PAST SURGERIES     RECTAL EXAM UNDER ANESTHESIA N/A 01/23/2019   Procedure: RECTAL EXAM UNDER ANESTHESIA, with FISTULOTOMY and  FIBRIN GLUE;  Surgeon: Fredirick Maudlin, MD;  Location: [facility] ORS;  Service: General;  Laterality: N/A;    Family History  Problem Relation Age of Onset   Hypertension Mother    Asthma Mother    Hypertension Father    Cancer Maternal Uncle     Social History Reviewed with no changes to be made today.   Outpatient Medications Prior to Visit  Medication Sig Dispense Refill   acetaminophen (TYLENOL) 500 MG tablet Take 2 tablets (1,000 mg total) by mouth every 6 (six) hours as needed (for pain scale < 4).     coconut oil OIL Apply 1 application topically as needed.  0   ibuprofen (ADVIL) 600 MG tablet Take 1 tablet (600 mg total) by mouth every 6 (six) hours as needed. 30 tablet 0   Prenatal Vit-Fe Fumarate-FA (PRENATAL MULTIVITAMIN) TABS tablet Take 1 tablet by mouth daily at 12 noon.     No facility-administered medications prior to  visit.    No Known Allergies     Objective:    BP 129/89   Pulse 87   Resp 16   Wt 192 lb 6.4 oz (87.3 kg)   SpO2 95%   BMI 35.76 kg/m  Wt Readings from Last 3 Encounters:  05/24/21 192 lb 6.4 oz (87.3 kg)  12/11/20 210 lb (95.3 kg)  12/06/20 207 lb 12.8 oz (94.3 kg)    Physical Exam Vitals and nursing note reviewed.  Constitutional:      Appearance: She is well-developed.  HENT:     Head: Normocephalic and atraumatic.  Cardiovascular:     Rate and Rhythm: Normal rate and regular rhythm.      Heart sounds: Normal heart sounds. No murmur heard.   No friction rub. No gallop.  Pulmonary:     Effort: Pulmonary effort is normal. No tachypnea or respiratory distress.     Breath sounds: Normal breath sounds. No decreased breath sounds, wheezing, rhonchi or rales.  Chest:     Chest wall: No tenderness.  Abdominal:     General: Bowel sounds are normal.     Palpations: Abdomen is soft.  Musculoskeletal:        General: Normal range of motion.     Cervical back: Normal range of motion.  Skin:    General: Skin is warm and dry.  Neurological:     Mental Status: She is alert and oriented to person, place, and time.     Coordination: Coordination normal.  Psychiatric:        Behavior: Behavior normal. Behavior is cooperative.        Thought Content: Thought content normal.        Judgment: Judgment normal.         Patient has been counseled extensively about nutrition and exercise as well as the importance of adherence with medications and regular follow-up. The patient was given clear instructions to go to ER or return to medical center if symptoms don't improve, worsen or new problems develop. The patient verbalized understanding.   Follow-up: Return in about 4 weeks (around 06/21/2021) for TELE on Tuesday Migraines.   Gildardo Pounds, FNP-BC [facility] [place]   05/24/2021, 9:20 PM

## 2021-05-25 LAB — CMP14+EGFR
ALT: 38 IU/L — ABNORMAL HIGH (ref 0–32)
AST: 23 IU/L (ref 0–40)
Albumin/Globulin Ratio: 1.3 (ref 1.2–2.2)
Albumin: 4.4 g/dL (ref 3.8–4.8)
Alkaline Phosphatase: 147 IU/L — ABNORMAL HIGH (ref 44–121)
BUN/Creatinine Ratio: 19 (ref 9–23)
BUN: 13 mg/dL (ref 6–24)
Bilirubin Total: 0.4 mg/dL (ref 0.0–1.2)
CO2: 21 mmol/L (ref 20–29)
Calcium: 9.7 mg/dL (ref 8.7–10.2)
Chloride: 100 mmol/L (ref 96–106)
Creatinine, Ser: 0.68 mg/dL (ref 0.57–1.00)
Globulin, Total: 3.4 g/dL (ref 1.5–4.5)
Glucose: 92 mg/dL (ref 70–99)
Potassium: 4.3 mmol/L (ref 3.5–5.2)
Sodium: 138 mmol/L (ref 134–144)
Total Protein: 7.8 g/dL (ref 6.0–8.5)
eGFR: 113 mL/min/{1.73_m2} (ref >59)

## 2021-05-25 LAB — HEMOGLOBIN A1C
Est. average glucose Bld gHb Est-mCnc: 111 mg/dL
Hgb A1c MFr Bld: 5.5 % (ref 4.8–5.6)

## 2021-05-25 LAB — THYROID PANEL WITH TSH
Free Thyroxine Index: 2.5 (ref 1.2–4.9)
T3 Uptake Ratio: 27 % (ref 24–39)
T4, Total: 9.4 ug/dL (ref 4.5–12.0)
TSH: 2.12 u[IU]/mL (ref 0.450–4.500)

## 2021-05-29 ENCOUNTER — Other Ambulatory Visit: Payer: Self-pay

## 2021-05-29 ENCOUNTER — Ambulatory Visit: Payer: Self-pay | Attending: Nurse Practitioner

## 2021-05-29 ENCOUNTER — Telehealth: Payer: Self-pay | Admitting: Nurse Practitioner

## 2021-05-29 NOTE — Telephone Encounter (Signed)
T husband came to the office to request a refill for Emigradonixin (migraim medicaition ) please sent it to [facility] pharmacy please call her back when is done or any question

## 2021-05-30 ENCOUNTER — Other Ambulatory Visit: Payer: Self-pay

## 2021-06-20 ENCOUNTER — Encounter: Payer: Self-pay | Admitting: Nurse Practitioner

## 2021-06-20 ENCOUNTER — Other Ambulatory Visit: Payer: Self-pay

## 2021-06-20 ENCOUNTER — Ambulatory Visit: Payer: Self-pay | Attending: Nurse Practitioner | Admitting: Nurse Practitioner

## 2021-06-20 ENCOUNTER — Other Ambulatory Visit: Payer: Self-pay | Admitting: Nurse Practitioner

## 2021-06-20 DIAGNOSIS — G43109 Migraine with aura, not intractable, without status migrainosus: Secondary | ICD-10-CM

## 2021-06-20 MED ORDER — ERGOTAMINE-CAFFEINE 1-100 MG PO TABS
ORAL_TABLET | ORAL | 0 refills | Status: DC
  Filled 2021-06-20: qty 100, 90d supply, fill #0

## 2021-06-20 MED ORDER — SUMATRIPTAN SUCCINATE 50 MG PO TABS
50.0000 mg | ORAL_TABLET | Freq: Once | ORAL | 6 refills | Status: DC
  Filled 2021-06-20: qty 10, 30d supply, fill #0
  Filled 2021-07-04: qty 10, 10d supply, fill #0

## 2021-06-20 NOTE — Progress Notes (Signed)
Virtual Visit via Telephone Note Due to national recommendations of social distancing due to [place] 19, telehealth visit is felt to be most appropriate for this patient at this time.  I discussed the limitations, risks, security and privacy concerns of performing an evaluation and management service by telephone and the availability of in person appointments. I also discussed with the patient that there may be a patient responsible charge related to this service. The patient expressed understanding and agreed to proceed.    I connected with Milyn Sanchez-Herrera on 06/20/21  at   8:50 AM EST  EDT by telephone and verified that I am speaking with the correct person using two identifiers.  Location of Patient: Private Residence   Location of Provider: [place] [facility] [place] [facility]    Persons participating in Telemedicine visit: Geryl Rankins FNP-BC [place]  Spanish interpreter [phone] [place]   History of Present Illness: Telemedicine visit for: Migraines Previously diagnosed with "emotional migraines" when she was in [place].  She did not know the name of the medication she was prescribed when I saw her at her last visit on 05-24-2021. Today she states the name of the medication is Migradorixina.  Associated symptoms of migraine include: Photophobia. She denies any other associated symptoms.  Migraines can last up to 2 weeks. Will prescribe Cafergot today and follow up in 2 weeks.    Past Medical History:  Diagnosis Date   Asthma    h/o no inhalers   Chest pain 01/20/2019   Fistula-in-ano    Headache    migraines    Past Surgical History:  Procedure Laterality Date   NO PAST SURGERIES     RECTAL EXAM UNDER ANESTHESIA N/A 01/23/2019   Procedure: RECTAL EXAM UNDER ANESTHESIA, with FISTULOTOMY and  FIBRIN GLUE;  Surgeon: Fredirick Maudlin, MD;  Location: [facility] ORS;  Service: General;  Laterality: N/A;    Family History  Problem Relation Age of Onset    Hypertension Mother    Asthma Mother    Hypertension Father    Cancer Maternal Uncle     Social History   Socioeconomic History   Marital status: Married    Spouse name: Not on file   Number of children: Not on file   Years of education: Not on file   Highest education level: Not on file  Occupational History   Not on file  Tobacco Use   Smoking status: Never   Smokeless tobacco: Never  Vaping Use   Vaping Use: Never used  Substance and Sexual Activity   Alcohol use: No   Drug use: No   Sexual activity: Yes    Birth control/protection: None  Other Topics Concern   Not on file  Social History Narrative   Not on file   Social Determinants of Health   Financial Resource Strain: Not on file  Food Insecurity: Food Insecurity Present   Worried About Charity fundraiser in the Last Year: Often true   Arboriculturist in the Last Year: Often true  Transportation Needs: Unmet Transportation Needs   Lack of Transportation (Medical): Yes   Lack of Transportation (Non-Medical): Yes  Physical Activity: Not on file  Stress: Not on file  Social Connections: Not on file     Observations/Objective: Awake, alert and oriented x 3   Review of Systems  Constitutional:  Negative for fever, malaise/fatigue and weight loss.  HENT: Negative.  Negative for nosebleeds.   Eyes: Negative.  Negative for blurred vision,  double vision and photophobia.  Respiratory: Negative.  Negative for cough and shortness of breath.   Cardiovascular: Negative.  Negative for chest pain, palpitations and leg swelling.  Gastrointestinal: Negative.  Negative for heartburn, nausea and vomiting.  Musculoskeletal: Negative.  Negative for myalgias.  Neurological:  Positive for headaches. Negative for dizziness, focal weakness and seizures.  Psychiatric/Behavioral: Negative.  Negative for suicidal ideas.    Assessment and Plan: Diagnoses and all orders for this visit:  Migraine with aura and without status  migrainosus, not intractable -     ergotamine-caffeine (CAFERGOT) 1-100 MG tablet; Two tablets at onset of attack; then 1 tablet every 30 minutes as needed; maximum: 6 tablets per attack; do not exceed 10 tablets/week    Follow Up Instructions Return in about 3 weeks (around 07/11/2021).     I discussed the assessment and treatment plan with the patient. The patient was provided an opportunity to ask questions and all were answered. The patient agreed with the plan and demonstrated an understanding of the instructions.   The patient was advised to call back or seek an in-person evaluation if the symptoms worsen or if the condition fails to improve as anticipated.  I provided 10 minutes of non-face-to-face time during this encounter including median intraservice time, reviewing previous notes, labs, imaging, medications and explaining diagnosis and management.  Gildardo Pounds, FNP-BC

## 2021-06-27 ENCOUNTER — Other Ambulatory Visit: Payer: Self-pay

## 2021-07-04 ENCOUNTER — Other Ambulatory Visit: Payer: Self-pay

## 2021-07-11 ENCOUNTER — Ambulatory Visit (HOSPITAL_BASED_OUTPATIENT_CLINIC_OR_DEPARTMENT_OTHER): Payer: Self-pay | Admitting: Nurse Practitioner

## 2021-07-11 ENCOUNTER — Other Ambulatory Visit: Payer: Self-pay

## 2021-07-11 ENCOUNTER — Encounter: Payer: Self-pay | Admitting: Nurse Practitioner

## 2021-07-11 DIAGNOSIS — G43109 Migraine with aura, not intractable, without status migrainosus: Secondary | ICD-10-CM

## 2021-07-11 MED ORDER — SUMATRIPTAN SUCCINATE 50 MG PO TABS
50.0000 mg | ORAL_TABLET | Freq: Once | ORAL | 3 refills | Status: DC
Start: 2021-08-04 — End: 2022-03-16
  Filled 2021-07-11: qty 9, 20d supply, fill #0

## 2021-07-11 NOTE — Progress Notes (Signed)
Virtual Visit via Telephone Note Due to national recommendations of social distancing due to [place] 19, telehealth visit is felt to be most appropriate for this patient at this time.  I discussed the limitations, risks, security and privacy concerns of performing an evaluation and management service by telephone and the availability of in person appointments. I also discussed with the patient that there may be a patient responsible charge related to this service. The patient expressed understanding and agreed to proceed.    I connected with Regina Shea on 07/11/21  at   8:50 AM EST  EDT by telephone and verified that I am speaking with the correct person using two identifiers.  Location of Patient: Private Residence   Location of Provider: [place] and [employer] Office    Persons participating in Telemedicine visit: Geryl Rankins FNP-BC [place]  Spanish interpreterClaudia [phone]   History of Present Illness: Telemedicine visit for: MIGRAINE FOLLOW UP  Previously diagnosed with "emotional migraines" when she was in [place].  I prescribed her sumatriptan hand on 06/20/2021 as she reported previously that she would have been prescribed Migradorixina in [place].  Associated symptoms of migraine include: Photophobia. She denies any other associated symptoms.  Migraines can last up to 2 weeks.  Today she reports significant improvement in the intensity and frequency of her migraines since starting Imitrex. At this time we will continue her on this medication.     Past Medical History:  Diagnosis Date   Asthma    h/o no inhalers   Chest pain 01/20/2019   Fistula-in-ano    Headache    migraines    Past Surgical History:  Procedure Laterality Date   NO PAST SURGERIES     RECTAL EXAM UNDER ANESTHESIA N/A 01/23/2019   Procedure: RECTAL EXAM UNDER ANESTHESIA, with FISTULOTOMY and  FIBRIN GLUE;  Surgeon: Fredirick Maudlin, MD;  Location: [facility]  ORS;  Service: General;  Laterality: N/A;    Family History  Problem Relation Age of Onset   Hypertension Mother    Asthma Mother    Hypertension Father    Cancer Maternal Uncle     Social History   Socioeconomic History   Marital status: Married    Spouse name: Not on file   Number of children: Not on file   Years of education: Not on file   Highest education level: Not on file  Occupational History   Not on file  Tobacco Use   Smoking status: Never   Smokeless tobacco: Never  Vaping Use   Vaping Use: Never used  Substance and Sexual Activity   Alcohol use: No   Drug use: No   Sexual activity: Yes    Birth control/protection: None  Other Topics Concern   Not on file  Social History Narrative   Not on file   Social Determinants of Health   Financial Resource Strain: Not on file  Food Insecurity: Food Insecurity Present   Worried About Charity fundraiser in the Last Year: Often true   Arboriculturist in the Last Year: Often true  Transportation Needs: Unmet Transportation Needs   Lack of Transportation (Medical): Yes   Lack of Transportation (Non-Medical): Yes  Physical Activity: Not on file  Stress: Not on file  Social Connections: Not on file     Observations/Objective: Awake, alert and oriented x 3   Review of Systems  Constitutional:  Negative for fever, malaise/fatigue and weight loss.  HENT: Negative.  Negative for nosebleeds.  Eyes: Negative.  Negative for blurred vision, double vision and photophobia.  Respiratory: Negative.  Negative for cough and shortness of breath.   Cardiovascular: Negative.  Negative for chest pain, palpitations and leg swelling.  Gastrointestinal: Negative.  Negative for heartburn, nausea and vomiting.  Musculoskeletal: Negative.  Negative for myalgias.  Neurological:  Positive for headaches. Negative for dizziness, focal weakness and seizures.  Psychiatric/Behavioral: Negative.  Negative for suicidal ideas.    Assessment  and Plan: Diagnoses and all orders for this visit:  Migraine with aura and without status migrainosus, not intractable -     SUMAtriptan (IMITREX) 50 MG tablet; Take 1 tablet (50 mg total) by mouth once for 1 dose. May repeat in 2 hours if headache persists or recurs. FILL AS 90 DAY SUPPLY    Follow Up Instructions Return for PAP SMEAR.     I discussed the assessment and treatment plan with the patient. The patient was provided an opportunity to ask questions and all were answered. The patient agreed with the plan and demonstrated an understanding of the instructions.   The patient was advised to call back or seek an in-person evaluation if the symptoms worsen or if the condition fails to improve as anticipated.  I provided 10 minutes of non-face-to-face time during this encounter including median intraservice time, reviewing previous notes, labs, imaging, medications and explaining diagnosis and management.  Gildardo Pounds, FNP-BC

## 2021-07-12 ENCOUNTER — Other Ambulatory Visit: Payer: Self-pay

## 2022-03-06 ENCOUNTER — Ambulatory Visit (HOSPITAL_BASED_OUTPATIENT_CLINIC_OR_DEPARTMENT_OTHER): Payer: Self-pay | Admitting: Nurse Practitioner

## 2022-03-06 ENCOUNTER — Encounter: Payer: Self-pay | Admitting: Nurse Practitioner

## 2022-03-06 DIAGNOSIS — N912 Amenorrhea, unspecified: Secondary | ICD-10-CM

## 2022-03-06 NOTE — Progress Notes (Signed)
Virtual Visit via Telephone Note  I discussed the limitations, risks, security and privacy concerns of performing an evaluation and management service by telephone and the availability of in person appointments. I also discussed with the patient that there may be a patient responsible charge related to this service. The patient expressed understanding and agreed to proceed.    I connected with Regina Shea on 03/06/22  at   1:50 PM EDT  EDT by telephone and verified that I am speaking with the correct person using two identifiers.  Location of Patient: Private Residence   Location of Provider: [place] and [place] participating in Telemedicine visit: Geryl Rankins FNP-BC [place]  Spanish Interpreter ID [phone]   History of Present Illness: Telemedicine visit for: positive home pregnancy test x3  [name] states she has been experiencing headaches, nausea and amenorrhea for several weeks. Took a pregnancy test x3 and all were positive. She will need a letter of confirmation for insurance purposes.     Past Medical History:  Diagnosis Date   Asthma    h/o no inhalers   Chest pain 01/20/2019   Fistula-in-ano    Headache    migraines    Past Surgical History:  Procedure Laterality Date   NO PAST SURGERIES     RECTAL EXAM UNDER ANESTHESIA N/A 01/23/2019   Procedure: RECTAL EXAM UNDER ANESTHESIA, with FISTULOTOMY and  FIBRIN GLUE;  Surgeon: Fredirick Maudlin, MD;  Location: [facility] ORS;  Service: General;  Laterality: N/A;    Family History  Problem Relation Age of Onset   Hypertension Mother    Asthma Mother    Hypertension Father    Cancer Maternal Uncle     Social History   Socioeconomic History   Marital status: Married    Spouse name: Not on file   Number of children: Not on file   Years of education: Not on file   Highest education level: Not on file  Occupational History   Not on file  Tobacco Use    Smoking status: Never   Smokeless tobacco: Never  Vaping Use   Vaping Use: Never used  Substance and Sexual Activity   Alcohol use: No   Drug use: No   Sexual activity: Yes    Birth control/protection: None  Other Topics Concern   Not on file  Social History Narrative   Not on file   Social Determinants of Health   Financial Resource Strain: Not on file  Food Insecurity: Food Insecurity Present (12/06/2020)   Hunger Vital Sign    Worried About Running Out of Food in the Last Year: Often true    Ran Out of Food in the Last Year: Often true  Transportation Needs: Unmet Transportation Needs (12/06/2020)   PRAPARE - Hydrologist (Medical): Yes    Lack of Transportation (Non-Medical): Yes  Physical Activity: Not on file  Stress: Not on file  Social Connections: Not on file     Observations/Objective: Awake, alert and oriented x 3   Review of Systems  Constitutional:  Negative for fever, malaise/fatigue and weight loss.  HENT: Negative.  Negative for nosebleeds.   Eyes: Negative.  Negative for blurred vision, double vision and photophobia.  Respiratory: Negative.  Negative for cough and shortness of breath.   Cardiovascular: Negative.  Negative for chest pain, palpitations and leg swelling.  Gastrointestinal: Negative.  Negative for heartburn, nausea and vomiting.  Genitourinary:  Amenorrhea  Musculoskeletal: Negative.  Negative for myalgias.  Neurological: Negative.  Negative for dizziness, focal weakness, seizures and headaches.  Psychiatric/Behavioral: Negative.  Negative for suicidal ideas.    Assessment and Plan: Diagnoses and all orders for this visit:  Amenorrhea -     Beta hCG quant (ref lab); Future     Follow Up Instructions Return if symptoms worsen or fail to improve.     I discussed the assessment and treatment plan with the patient. The patient was provided an opportunity to ask questions and all were answered. The  patient agreed with the plan and demonstrated an understanding of the instructions.   The patient was advised to call back or seek an in-person evaluation if the symptoms worsen or if the condition fails to improve as anticipated.  I provided 8 minutes of non-face-to-face time during this encounter including median intraservice time, reviewing previous notes, labs, imaging, medications and explaining diagnosis and management.  Gildardo Pounds, FNP-BC

## 2022-03-07 ENCOUNTER — Ambulatory Visit: Payer: Self-pay | Admitting: Critical Care Medicine

## 2022-03-07 ENCOUNTER — Ambulatory Visit: Payer: Self-pay | Attending: Nurse Practitioner

## 2022-03-07 DIAGNOSIS — N912 Amenorrhea, unspecified: Secondary | ICD-10-CM

## 2022-03-08 ENCOUNTER — Telehealth: Payer: Self-pay | Admitting: Nurse Practitioner

## 2022-03-08 ENCOUNTER — Encounter: Payer: Self-pay | Admitting: Nurse Practitioner

## 2022-03-08 ENCOUNTER — Other Ambulatory Visit: Payer: Self-pay | Admitting: Nurse Practitioner

## 2022-03-08 DIAGNOSIS — Z3201 Encounter for pregnancy test, result positive: Secondary | ICD-10-CM

## 2022-03-08 LAB — BETA HCG QUANT (REF LAB): hCG Quant: 83969 m[IU]/mL

## 2022-03-08 NOTE — Telephone Encounter (Signed)
Copied from [place] [phone]. Topic: Test Results - Chart Correction >> Mar 08, 2022  1:21 PM Leitha Schuller wrote: Patient checking status of 8-9 lab results  Please fu w/ patient once resulted

## 2022-03-09 NOTE — Telephone Encounter (Signed)
Pt was called and informed of lab results. 

## 2022-03-16 ENCOUNTER — Encounter (HOSPITAL_COMMUNITY): Payer: Self-pay

## 2022-03-16 ENCOUNTER — Inpatient Hospital Stay (HOSPITAL_COMMUNITY)
Admission: AD | Admit: 2022-03-16 | Discharge: 2022-03-16 | Disposition: A | Discharge status: Home / Self Care | Payer: Self-pay | Attending: Obstetrics & Gynecology | Admitting: Obstetrics & Gynecology

## 2022-03-16 ENCOUNTER — Inpatient Hospital Stay (HOSPITAL_COMMUNITY): Payer: Self-pay

## 2022-03-16 DIAGNOSIS — Z3A00 Weeks of gestation of pregnancy not specified: Secondary | ICD-10-CM | POA: Insufficient documentation

## 2022-03-16 DIAGNOSIS — R519 Headache, unspecified: Secondary | ICD-10-CM | POA: Insufficient documentation

## 2022-03-16 DIAGNOSIS — O09521 Supervision of elderly multigravida, first trimester: Secondary | ICD-10-CM | POA: Insufficient documentation

## 2022-03-16 DIAGNOSIS — Z3A09 9 weeks gestation of pregnancy: Secondary | ICD-10-CM | POA: Insufficient documentation

## 2022-03-16 DIAGNOSIS — Z3491 Encounter for supervision of normal pregnancy, unspecified, first trimester: Secondary | ICD-10-CM

## 2022-03-16 DIAGNOSIS — O208 Other hemorrhage in early pregnancy: Secondary | ICD-10-CM | POA: Insufficient documentation

## 2022-03-16 DIAGNOSIS — O26891 Other specified pregnancy related conditions, first trimester: Secondary | ICD-10-CM | POA: Insufficient documentation

## 2022-03-16 LAB — WET PREP, GENITAL
Clue Cells Wet Prep HPF POC: NONE SEEN
Sperm: NONE SEEN
Trich, Wet Prep: NONE SEEN
WBC, Wet Prep HPF POC: <10 (ref <10)
Yeast Wet Prep HPF POC: NONE SEEN

## 2022-03-16 LAB — CBC
HCT: 41.7 % (ref 36.0–46.0)
Hemoglobin: 14 g/dL (ref 12.0–15.0)
MCH: 28.4 pg (ref 26.0–34.0)
MCHC: 33.6 g/dL (ref 30.0–36.0)
MCV: 84.6 fL (ref 80.0–100.0)
Platelets: 325 10*3/uL (ref 150–400)
RBC: 4.93 MIL/uL (ref 3.87–5.11)
RDW: 13.2 % (ref 11.5–15.5)
WBC: 10.2 10*3/uL (ref 4.0–10.5)
nRBC: 0 % (ref 0.0–0.2)

## 2022-03-16 LAB — HCG, QUANTITATIVE, PREGNANCY: hCG, Beta Chain, Quant, S: 164235 m[IU]/mL — ABNORMAL HIGH (ref <5)

## 2022-03-16 MED ORDER — CYCLOBENZAPRINE HCL 5 MG PO TABS
10.0000 mg | ORAL_TABLET | Freq: Once | ORAL | Status: AC
  Administered 2022-03-16: 10 mg via ORAL
  Filled 2022-03-16: qty 2

## 2022-03-16 MED ORDER — METOCLOPRAMIDE HCL 10 MG PO TABS
10.0000 mg | ORAL_TABLET | Freq: Four times a day (QID) | ORAL | 0 refills | Status: DC
  Filled 2022-03-16: qty 30, 8d supply, fill #0

## 2022-03-16 MED ORDER — CYCLOBENZAPRINE HCL 10 MG PO TABS
10.0000 mg | ORAL_TABLET | Freq: Two times a day (BID) | ORAL | 1 refills | Status: DC | PRN
Start: 2022-03-16 — End: 2022-04-23
  Filled 2022-03-16: qty 30, 15d supply, fill #0

## 2022-03-16 MED ORDER — METOCLOPRAMIDE HCL 10 MG PO TABS
10.0000 mg | ORAL_TABLET | Freq: Once | ORAL | Status: AC
  Administered 2022-03-16: 10 mg via ORAL
  Filled 2022-03-16: qty 1

## 2022-03-16 MED ORDER — ACETAMINOPHEN-CAFFEINE 500-65 MG PO TABS
2.0000 | ORAL_TABLET | Freq: Once | ORAL | Status: AC
  Administered 2022-03-16: 2 via ORAL
  Filled 2022-03-16: qty 2

## 2022-03-16 NOTE — Discharge Instructions (Signed)

## 2022-03-16 NOTE — MAU Note (Signed)
..  Regina Shea is a 41 y.o. at Unknown here in MAU reporting: Since August 3rd she has had a migraine and abdominal cramping. This morning she felt uneasy, went to the restroom and saw mucous and blood on her underwear. No bleeding now. Took tylenol 1000 mg around 2pm.   LMP: sometime in June. Has irregular periods  Pain score: 10/10 migraine. 0/10 no cramping right now, it comes and goes.  Vitals:   03/16/22 2129  BP: 139/78  Pulse: 93  Resp: 18  Temp: 98 F (36.7 C)  SpO2: 100%     QMV:HQIONG to doppler in triage.  Lab orders placed from triage: UA

## 2022-03-16 NOTE — MAU Provider Note (Signed)
History     CSN: [id number]  Arrival date and time: 03/16/22 2038   None     Chief Complaint  Patient presents with   Vaginal Bleeding   HPI  Regina Shea is a 41 y.o. [id number] at Unknown gestation who presents for evaluation of vaginal bleeding and a headache. Patient reports her main concern is her headache. Patient rates the pain as a 10/10 and has tried tylenol for the pain with no relief.   She reports when she goes to the bathroom, she sees bloody mucous discharge. She had a HCG of 89,000 on 8/9 and was instructed to set up prenatal care but needs assistance with that.   She denies any discharge. Denies any constipation, diarrhea or any urinary complaints.    OB History     Gravida  7   Para  5   Term  5   Preterm  0   AB  1   Living  5      SAB  1   IAB  0   Ectopic  0   Multiple  0   Live Births  5           Past Medical History:  Diagnosis Date   Asthma    h/o no inhalers   Chest pain 01/20/2019   Fistula-in-ano    Headache    migraines    Past Surgical History:  Procedure Laterality Date   NO PAST SURGERIES     RECTAL EXAM UNDER ANESTHESIA N/A 01/23/2019   Procedure: RECTAL EXAM UNDER ANESTHESIA, with FISTULOTOMY and  FIBRIN GLUE;  Surgeon: Fredirick Maudlin, MD;  Location: [facility] ORS;  Service: General;  Laterality: N/A;    Family History  Problem Relation Age of Onset   Hypertension Mother    Asthma Mother    Hypertension Father    Cancer Maternal Uncle     Social History   Tobacco Use   Smoking status: Never   Smokeless tobacco: Never  Vaping Use   Vaping Use: Never used  Substance Use Topics   Alcohol use: No   Drug use: No    Allergies: No Known Allergies  No medications prior to admission.    Review of Systems  Constitutional: Negative.  Negative for fatigue and fever.  HENT: Negative.    Respiratory: Negative.  Negative for shortness of breath.   Cardiovascular: Negative.  Negative for chest  pain.  Gastrointestinal: Negative.  Negative for abdominal pain, constipation, diarrhea, nausea and vomiting.  Genitourinary:  Positive for vaginal bleeding. Negative for dysuria and vaginal discharge.  Neurological:  Positive for headaches. Negative for dizziness.   Physical Exam   Blood pressure 123/73, pulse 80, temperature 98 F (36.7 C), temperature source Oral, resp. rate 18, height 4' 11.84" (1.52 m), SpO2 100 %, unknown if currently breastfeeding.  Patient Vitals for the past 24 hrs:  BP Temp Temp src Pulse Resp SpO2 Height  03/16/22 2259 123/73 -- -- 80 -- -- --  03/16/22 2146 111/63 -- -- 84 -- -- --  03/16/22 2129 139/78 98 F (36.7 C) Oral 93 18 100 % 4' 11.84" (1.52 m)    Physical Exam Vitals and nursing note reviewed.  Constitutional:      General: She is not in acute distress.    Appearance: She is well-developed.  HENT:     Head: Normocephalic.  Eyes:     Pupils: Pupils are equal, round, and reactive to light.  Cardiovascular:     Rate  and Rhythm: Normal rate and regular rhythm.     Heart sounds: Normal heart sounds.  Pulmonary:     Effort: Pulmonary effort is normal. No respiratory distress.     Breath sounds: Normal breath sounds.  Abdominal:     General: Bowel sounds are normal. There is no distension.     Palpations: Abdomen is soft.     Tenderness: There is no abdominal tenderness.  Skin:    General: Skin is warm and dry.  Neurological:     Mental Status: She is alert and oriented to person, place, and time.  Psychiatric:        Mood and Affect: Mood normal.        Behavior: Behavior normal.        Thought Content: Thought content normal.        Judgment: Judgment normal.      MAU Course  Procedures  Results for orders placed or performed during the hospital encounter of 03/16/22 (from the past 24 hour(s))  CBC     Status: None   Collection Time: 03/16/22  9:35 PM  Result Value Ref Range   WBC 10.2 4.0 - 10.5 K/uL   RBC 4.93 3.87 - 5.11  MIL/uL   Hemoglobin 14.0 12.0 - 15.0 g/dL   HCT 41.7 36.0 - 46.0 %   MCV 84.6 80.0 - 100.0 fL   MCH 28.4 26.0 - 34.0 pg   MCHC 33.6 30.0 - 36.0 g/dL   RDW 13.2 11.5 - 15.5 %   Platelets 325 150 - 400 K/uL   nRBC 0.0 0.0 - 0.2 %  hCG, quantitative, pregnancy     Status: Abnormal   Collection Time: 03/16/22  9:35 PM  Result Value Ref Range   hCG, Beta Chain, Quant, S 164,235 (H) <5 mIU/mL  Wet prep, genital     Status: None   Collection Time: 03/16/22  9:42 PM  Result Value Ref Range   Yeast Wet Prep HPF POC NONE SEEN NONE SEEN   Trich, Wet Prep NONE SEEN NONE SEEN   Clue Cells Wet Prep HPF POC NONE SEEN NONE SEEN   WBC, Wet Prep HPF POC <10 <10   Sperm NONE SEEN      US OB Comp Less 14 Wks  Result Date: 03/16/2022 CLINICAL DATA:  Determine viability. EXAM: OBSTETRIC <14 WK ULTRASOUND TECHNIQUE: Transabdominal ultrasound was performed for evaluation of the gestation as well as the maternal uterus and adnexal regions. COMPARISON:  None Available. FINDINGS: Intrauterine gestational sac: Single Yolk sac:  Visualized. Embryo:  Visualized. Cardiac Activity: Visualized. Heart Rate: 178 bpm CRL:   25.7 mm   9 w 2 d                  [place] EDC: October 17, 2022 Subchorionic hemorrhage:  Small Maternal uterus/adnexae: A corpus luteum cyst is seen within an otherwise normal appearing right ovary. The left ovary is visualized and is normal in appearance. No pelvic free fluid is seen. IMPRESSION: Single, viable intrauterine pregnancy at approximately 9 weeks and 2 days gestation by ultrasound evaluation. Electronically Signed   By: Virgina Norfolk M.D.   On: 03/16/2022 22:13     MDM Labs ordered and reviewed.   UA, UPT CBC, HCG ABO/Rh- A Pos Wet prep and gc/chlamydia US OB Comp Less 14 weeks with Transvaginal  CNM independently reviewed the imaging ordered. Imaging show live IUP measuring 9 weeks  Excedrin Tension and reglan PO- minimal improvement of headache Flexeril PO-  patient reports  feeling significantly better.  Assessment and Plan   1. Normal intrauterine pregnancy on prenatal ultrasound in first trimester   2. [redacted] weeks gestation of pregnancy   3. Pregnancy headache in first trimester     -Discharge home in stable condition -Rx for flexeril sent to patient's pharmacy -First trimester precautions discussed -Patient advised to follow-up with [facility] to establish care- message sent to assist patient in access to care -Patient may return to MAU as needed or if her condition were to change or worsen  Wende Mott, CNM 03/16/2022, 11:58 PM

## 2022-03-19 ENCOUNTER — Other Ambulatory Visit: Payer: Self-pay

## 2022-03-19 LAB — GC/CHLAMYDIA PROBE AMP (~~LOC~~) NOT AT ARMC
Chlamydia: NEGATIVE
Comment: NEGATIVE
Comment: NORMAL
Neisseria Gonorrhea: NEGATIVE

## 2022-03-26 ENCOUNTER — Other Ambulatory Visit: Payer: Self-pay

## 2022-03-27 ENCOUNTER — Telehealth (INDEPENDENT_AMBULATORY_CARE_PROVIDER_SITE_OTHER): Payer: Self-pay | Admitting: *Deleted

## 2022-03-27 DIAGNOSIS — Z758 Other problems related to medical facilities and other health care: Secondary | ICD-10-CM

## 2022-03-27 DIAGNOSIS — F419 Anxiety disorder, unspecified: Secondary | ICD-10-CM

## 2022-03-27 DIAGNOSIS — O209 Hemorrhage in early pregnancy, unspecified: Secondary | ICD-10-CM

## 2022-03-27 DIAGNOSIS — Z641 Problems related to multiparity: Secondary | ICD-10-CM

## 2022-03-27 DIAGNOSIS — Z5941 Food insecurity: Secondary | ICD-10-CM

## 2022-03-27 DIAGNOSIS — Z789 Other specified health status: Secondary | ICD-10-CM

## 2022-03-27 DIAGNOSIS — O0990 Supervision of high risk pregnancy, unspecified, unspecified trimester: Secondary | ICD-10-CM

## 2022-03-27 DIAGNOSIS — O09529 Supervision of elderly multigravida, unspecified trimester: Secondary | ICD-10-CM

## 2022-03-27 DIAGNOSIS — O99340 Other mental disorders complicating pregnancy, unspecified trimester: Secondary | ICD-10-CM

## 2022-03-27 DIAGNOSIS — F32A Depression, unspecified: Secondary | ICD-10-CM

## 2022-03-27 DIAGNOSIS — Z3689 Encounter for other specified antenatal screening: Secondary | ICD-10-CM

## 2022-03-27 NOTE — Progress Notes (Signed)
New OB Intake I called patient with Interpreter to instruct patient how to join virtual visit. Link sent.   I connected with  Regina Shea on 03/27/22 at  1:15 PM EDT by MyChart Video Visit and verified that I am speaking with the correct person using two identifiers. Nurse is located at [facility] and pt is located at home.  I discussed the limitations, risks, security and privacy concerns of performing an evaluation and management service by telephone and the availability of in person appointments. I also discussed with the patient that there may be a patient responsible charge related to this service. The patient expressed understanding and agreed to proceed.  I explained I am completing New OB Intake today. We discussed her EDD of 10/17/22 that is based on [place] . Pt is G7/P5015. I reviewed her allergies, medications, Medical/Surgical/OB history, and appropriate screenings. I informed her of [facility] services. [facility] information placed in AVS. Based on history, this is a/an  pregnancy complicated by AMA  , Pierpoint multipara.   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 Patient given information for [facility] Healthy Baby website for more information about Women's and [place]. Patient is interested in water birth. Offered upcoming OB visit with CNM to discuss further.  MyChart/Babyscripts Does not use MyChart. I explained pt will have some visits in office and some virtually.- will send link.  Babyscripts instructions given and order placed.   Blood Pressure Cuff/Weight Scale Patient is self-pay; explained patient will be given BP cuff at first prenatal  appt. Explained after first prenatal appt pt will check weekly and document in 75. Patient does  have weight scale.   Anatomy US Explained first scheduled [place] will be around 19 weeks. Anatomy US scheduled for 10/ 25/23 at 0745. Pt notified to arrive at 0730.  Labs Discussed Johnsie Cancel genetic screening with patient. Would like both Panorama and Horizon drawn at new OB visit. Routine prenatal labs needed.  Covid Vaccine Patient has had the covid vaccine.   Is patient a CenteringPregnancy candidate?  Declined Declined due to Childcare   Is patient a Mom+Baby Combined Care candidate?  Not a candidate    Social Determinants of Health Food Insecurity: Patient expresses food insecurity. Food Market information given to patient; explained patient may visit at the end of first OB appointment. WIC Referral: Patient is interested in referral to [facility].  Transportation: Patient expressed transportation needs. Childcare: Discussed no children allowed at ultrasound appointments. Offered childcare services; patient declines childcare services at this time.  First visit review I reviewed new OB appt with pt. I explained she will have a provider visit that includes exam, bloodwork. Explained pt will be seen by Dr. Damita Dunnings at first visit; encounter routed to appropriate provider. Explained that patient will be seen by pregnancy navigator following visit with provider.   Staci Acosta 03/27/2022  4:21 PM

## 2022-04-04 NOTE — BH Specialist Note (Addendum)
Integrated Behavioral Health via Telemedicine Visit  04/09/2022 Regina Shea [id number]  Number of Integrated Behavioral Health Clinician visits: 1- Initial Visit  Session Start time: 4132   Session End time: 4401  Total time in minutes: 15   Referring Provider: Radene Gunning, MD Patient/Family location: [facility] Provider location: [facility] for [place] [facility]  All persons participating in visit: Patient Regina Shea and [facility] and [place], [place]  Types of Service: Individual psychotherapy and Video visit  I connected with Regina Shea and/or Regina Shea's  n/a  via  Telephone or Video Enabled Telemedicine Application  (Video is Caregility application) and verified that I am speaking with the correct person using two identifiers. Discussed confidentiality: Yes   I discussed the limitations of telemedicine and the availability of in person appointments.  Discussed there is a possibility of technology failure and discussed alternative modes of communication if that failure occurs.  I discussed that engaging in this telemedicine visit, they consent to the provision of behavioral healthcare and the services will be billed under their insurance.  Patient and/or legal guardian expressed understanding and consented to Telemedicine visit: Yes   Presenting Concerns: Patient and/or family reports the following symptoms/concerns: Increased depression and anxiety, attributes to migraines (untreated in pregnancy); pt last saw neurologist 6 years ago in [place] and was being treated with Migradoroxina.Pt had two migraines in the past month, lasting 4-5 days each time.  Duration of problem: Increase pregnancy (depressive/anxious symptoms); migraines began about 17 years ago; Severity of problem:  moderately severe  Patient and/or Family's Strengths/Protective Factors: Sense of purpose  and Physical Health (exercise, healthy diet, medication compliance, etc.)  Goals Addressed: Patient will:  Reduce symptoms of: anxiety, depression, and stress   Progress towards Goals: Ongoing  Interventions: Interventions utilized:  Functional Assessment of ADLs, Psychoeducation and/or Health Education, and Supportive Reflection Standardized Assessments completed: Not Needed  Patient and/or Family Response: Patient agrees with treatment plan.   Assessment: Patient currently experiencing Adjustment disorder with mixed depressed and anxious mood and Psychosocial stress  Patient may benefit from psychoeducation and brief therapeutic interventions regarding coping with symptoms of depression, anxiety, life stress   Plan: Follow up with behavioral health clinician on : Two weeks with [facility] Behavioral recommendations:  --Continue prioritizing healthy self-care (regular meals, adequate rest); avoiding migraine triggers, as able  Referral(s): [place] (In Clinic)  I discussed the assessment and treatment plan with the patient and/or parent/guardian. They were provided an opportunity to ask questions and all were answered. They agreed with the plan and demonstrated an understanding of the instructions.   They were advised to call back or seek an in-person evaluation if the symptoms worsen or if the condition fails to improve as anticipated.  [place], LCSW     03/27/2022    1:51 PM 05/24/2021    4:08 PM 12/06/2020    9:44 AM 10/20/2020    8:40 AM 08/10/2020    9:22 AM  Depression screen PHQ 2/9  Decreased Interest 3 0 0 0 0  Down, Depressed, Hopeless 3 0 0 0 0  PHQ - 2 Score 6 0 0 0 0  Altered sleeping '3  1 1 1  '$ Tired, decreased energy '3  3 3 2  '$ Change in appetite '3  1 1 '$ 0  Feeling bad or failure about yourself  0  0 0 0  Trouble concentrating 0  0 0 0  Moving  slowly or fidgety/restless 0  0 0 0  Suicidal thoughts 0  0  0  PHQ-9 Score  '15  5 5 3      '$ 03/27/2022    1:54 PM 08/10/2020    9:22 AM 07/13/2020    8:47 AM 06/15/2020    9:26 AM  GAD 7 : Generalized Anxiety Score  Nervous, Anxious, on Edge 3 0 0 1  Control/stop worrying 3 0 0 0  Worry too much - different things '3 1 1 1  '$ Trouble relaxing '3 1 1 1  '$ Restless 0 0 0 0  Easily annoyed or irritable '1 1 1 1  '$ Afraid - awful might happen 3 1 0 0  Total GAD 7 Score '16 4 3 '$ 4

## 2022-04-09 ENCOUNTER — Ambulatory Visit: Payer: Self-pay | Admitting: Clinical

## 2022-04-09 DIAGNOSIS — Z658 Other specified problems related to psychosocial circumstances: Secondary | ICD-10-CM

## 2022-04-09 DIAGNOSIS — F4323 Adjustment disorder with mixed anxiety and depressed mood: Secondary | ICD-10-CM

## 2022-04-12 NOTE — Progress Notes (Signed)
Integrated Behavioral Health via Telemedicine Visit  04/12/2022 Regina Shea [id number]  Number of Integrated Behavioral Health Clinician visits: 1- Initial Visit  Session Start time: 4967   Session End time: 5916  Total time in minutes: 15   Referring Provider: Beatrix Shipper Patient/Family location: [facility] Provider location: *** All persons participating in visit: *** Types of Service: {CHL AMB TYPE OF SERVICE:[phone]}  I connected with Regina Shea and/or Regina Shea's {family members:20773} via  Telephone or Geologist, engineering  (Video is Caregility application) and verified that I am speaking with the correct person using two identifiers. Discussed confidentiality: Yes   I discussed the limitations of telemedicine and the availability of in person appointments.  Discussed there is a possibility of technology failure and discussed alternative modes of communication if that failure occurs.  I discussed that engaging in this telemedicine visit, they consent to the provision of behavioral healthcare and the services will be billed under their insurance.  Patient and/or legal guardian expressed understanding and consented to Telemedicine visit: Yes   Presenting Concerns: Patient and/or family reports the following symptoms/concerns: *** Duration of problem: ***; Severity of problem: {Mild/Moderate/Severe:20260}  Patient and/or Family's Strengths/Protective Factors: {CHL AMB BH PROTECTIVE FACTORS:[phone]}  Goals Addressed: Patient will:  Reduce symptoms of: {IBH Symptoms:21014056}   Increase knowledge and/or ability of: {IBH Patient Tools:21014057}   Demonstrate ability to: {IBH Goals:21014053}  Progress towards Goals: {CHL AMB BH PROGRESS TOWARDS GOALS:[phone]}  Interventions: Interventions utilized:  {IBH Interventions:21014054} Standardized Assessments completed: {IBH Screening Tools:21014051}  Patient  and/or Family Response: ***  Assessment: Patient currently experiencing ***.   Patient may benefit from ***.  Plan: Follow up with behavioral health clinician on : *** Behavioral recommendations: *** Referral(s): {IBH Referrals:21014055}  I discussed the assessment and treatment plan with the patient and/or parent/guardian. They were provided an opportunity to ask questions and all were answered. They agreed with the plan and demonstrated an understanding of the instructions.   They were advised to call back or seek an in-person evaluation if the symptoms worsen or if the condition fails to improve as anticipated.  Regina Shea

## 2022-04-18 DIAGNOSIS — Z5941 Food insecurity: Secondary | ICD-10-CM | POA: Insufficient documentation

## 2022-04-18 DIAGNOSIS — Z5982 Transportation insecurity: Secondary | ICD-10-CM | POA: Insufficient documentation

## 2022-04-18 DIAGNOSIS — G43909 Migraine, unspecified, not intractable, without status migrainosus: Secondary | ICD-10-CM | POA: Insufficient documentation

## 2022-04-18 NOTE — Progress Notes (Signed)
History:   Regina Shea is a 41 y.o. H4T6546 at 65w5dby 9 wk [place] seen today for her first obstetrical visit.  Patient does intend to breast feed but has issues I the past.   Pregnancy history fully reviewed. Obstetrical history is significant for 5 FT SVD that were uncomplicated.   Patient reports  issues with migraines (not a new problem but distressing for her) . Normally her migraines go away with her pregnancies but not with this one. She feels like she is under a lot of pressure and this may be contributing. This pregnancy was unplanned (but desired) and she has a one year old at home. She doesn't drive and her husband works 7 days a week. She is at home with 5 boys.    The plan for birth control is vasectomy. Her husband had planned to go but then he lost all the paperwork in the process of them moving to a new house.    HISTORY: OB History  Gravida Para Term Preterm AB Living  '7 5 5 '$ 0 1 5  SAB IAB Ectopic Multiple Live Births  1 0 0 0 5    # Outcome Date GA Lbr Len/2nd Weight Sex Delivery Anes PTL Lv  7 Current           6 Term 12/11/20 380w3d6:33 / 00:04 7 lb 12.7 oz (3.535 kg) M Vag-Spont EPI  LIV     Birth Comments: elective IOL     Name: SANCHEZ-HERRERA,BOY Letizia     Apgar1: 8  Apgar5: 9  5 SAB 03/02/20 8w73w0d      4 Term 02/21/16 37w32w0d25 / 00:09 5 lb 12 oz (2.608 kg) M Vag-Spont EPI  LIV     Birth Comments: wnl except +GBS; said most of pregnancy in [place] was told was 2 weeks less for gestatinal age     Name: SANCMARKESIA, CRILLY Apgar1: 9  Apgar5: 9  3 Term 05/05/13 40w148w1d1 / 00:29 7 lb 13.2 oz (3.55 kg) M Vag-Spont EPI  LIV     Birth Comments: wnl     Name: SANCHEZ-HERRERA,BOY Raianna     Apgar1: 8  Apgar5: 9  2 Term 09/25/06 37w0d63w0d (2.722 kg) M Vag-Spont None  LIV     Birth Comments: wnl   1 Term 01/15/05 [redacted]w[redacted]d [redacted]w[redacted]d(2.722 kg) M Vag-Spont None  LIV     Birth Comments: cord wrapped around neck; otherwise wnl      No pap smear on file.    Past Medical History:  Diagnosis Date   Aneurysm (HCC) [place]  never confirmed by tests   Asthma    h/o no inhalers   Chest pain 01/20/2019   Fistula-in-ano    Headache    migraines   Vision loss 2012   states lost vision in [place], they told her may be aneurysm, but never had follow up   Past Surgical History:  Procedure Laterality Date   NO PAST SURGERIES     RECTAL EXAM UNDER ANESTHESIA N/A 01/23/2019   Procedure: RECTAL EXAM UNDER ANESTHESIA, with FISTULOTOMY and  FIBRIN GLUE;  Surgeon: Cannon,Fredirick [facility];  Service: General;  Laterality: N/A;   Family History  Problem Relation Age of Onset   Hypertension Mother    Asthma Mother    Hypertension Father    Hypertension Brother  Cancer Maternal Uncle    Social History   Tobacco Use   Smoking status: Never   Smokeless tobacco: Never  Vaping Use   Vaping Use: Never used  Substance Use Topics   Alcohol use: No   Drug use: No   No Known Allergies Current Outpatient Medications on File Prior to Visit  Medication Sig Dispense Refill   acetaminophen (TYLENOL) 500 MG tablet Take 2 tablets (1,000 mg total) by mouth every 6 (six) hours as needed (for pain scale < 4).     Prenatal Vit-Fe Fumarate-FA (PRENATAL VITAMINS PO) Take 1 tablet by mouth daily.     No current facility-administered medications on file prior to visit.    Review of Systems Pertinent items noted in HPI and remainder of comprehensive ROS otherwise negative.  Physical Exam:   Vitals:   04/23/22 0854 04/23/22 0909  BP: (!) 141/72 135/79  Pulse: 92   Weight: 192 lb (87.1 kg)    Fetal Heart Rate (bpm): 150 General: well-developed, well-nourished female in no acute distress  Breasts:  normal appearance, no masses or tenderness bilaterally  Skin: normal coloration and turgor, no rashes  Neurologic: oriented, normal, negative, normal mood  Extremities: normal strength, tone, and  muscle mass, ROM of all joints is normal  HEENT PERRLA, extraocular movement intact and sclera clear, anicteric  Neck supple and no masses  Cardiovascular: regular rate and rhythm  Respiratory:  no respiratory distress, normal breath sounds  Abdomen: soft, non-tender; bowel sounds normal; no masses,  no organomegaly  Pelvic: normal external genitalia, no lesions, normal vaginal mucosa, normal vaginal discharge, normal cervix, pap smear done. Uterine size:  aga    Assessment:    Pregnancy: [id number] Patient Active Problem List   Diagnosis Date Noted   Migraines 04/18/2022   Transportation insecurity 04/18/2022   Food insecurity 04/18/2022   Supervision of high risk pregnancy, antepartum 03/27/2022   Vaginal bleeding in pregnancy, first trimester 03/27/2022   Grand multipara 03/27/2022   Lipoma 10/20/2020   AMA (advanced maternal age) multigravida 35+ 06/09/2020   Language barrier 06/09/2020     Plan:    1. Antepartum multigravida of advanced maternal age Plan for NIPS and detailed anatomy Age >40, plan for IOL at 49w. She does want a waterbirth so we talked about the main limit would be no pitocin in waterbirth but we can maybe work around that.   2. Language barrier Interpreter used throughout appt  3. Supervision of high risk pregnancy, antepartum Initial labs drawn. Continue prenatal vitamins. Problem list reviewed and updated. Genetic Screening discussed, NIPS: ordered. Ultrasound discussed; fetal anatomic survey: ordered. Scheduled for 10/25.  Anticipatory guidance about prenatal visits given including labs, ultrasounds, and testing. Discussed mood and option for medication based therapy - she is already seeing IBH - pt would like to start medication. We will start low dose Zoloft and then can titrate up if she notices some improvement. Discussed may also be situational and may not be as helpful given this.  Flu shot given today Discussed usage of Babyscripts. She would  not use it for virtual.   4. Grand multipara Optimize HGB for PPH risk  5. Migraines Discussed options available for treatment and also prevention.  Recommend Mag/B12 for prevention.  Sent in Compazine to be combined with benadryl for when migraine occurs and does not respond to excedrin tension. She can take 2 excedrin tension when migraine occurs.    The nature of [place] for [facility]ulty Practice  with multiple MDs and Advanced Practice Providers was explained to patient; also emphasized that residents, students are part of our team. Routine obstetric precautions reviewed. Encouraged to seek out care at office or emergency room [facility] preferred) for urgent and/or emergent concerns. Return in about 4 weeks (around 05/21/2022) for OB VISIT, MD or APP.    Radene Gunning, MD, [place] for [facility], [place]

## 2022-04-19 ENCOUNTER — Ambulatory Visit: Payer: Self-pay | Admitting: Clinical

## 2022-04-19 DIAGNOSIS — F4323 Adjustment disorder with mixed anxiety and depressed mood: Secondary | ICD-10-CM

## 2022-04-19 NOTE — BH Specialist Note (Signed)
Integrated Behavioral Health via Telemedicine Visit I reviewed patient visit with the [facility] Intern, and I concur with the treatment plan, as documented in the [facility] Intern note.   No charge for this visit due to [facility] Intern seeing patient.  Vesta Mixer, MSW, [place] for [facility] at [facility]   04/19/2022 Regina Shea [id number]  Number of [place] Clinician visits: 2- Second Visit  Session Start time: 7989   Session End time: 2119  Total time in minutes: 21   Referring Provider: Dr. Radene Gunning Patient/Family location: Home  [facility] Provider location: Center for [place] at [facility]  All persons participating in visit: Patient and [place] and [place] [phone])  Types of Service: Individual psychotherapy  I connected with Regina Shea )via Telephone or Video Enabled Telemedicine Application  (Video is Caregility application) and verified that I am speaking with the correct person using two identifiers. Discussed confidentiality: Yes   I discussed the limitations of telemedicine and the availability of in person appointments.  Discussed there is a possibility of technology failure and discussed alternative modes of communication if that failure occurs.  I discussed that engaging in this telemedicine visit, they consent to the provision of behavioral healthcare and the services will be billed under their insurance.  Patient and/or legal guardian expressed understanding and consented to Telemedicine visit: Yes   Presenting Concerns: Patient and/or family reports the following symptoms/concerns: anxiety, depression and migraines Duration of problem: Hx of migraines for over 17 year ; Severity of problem: moderate  Patient and/or Family's Strengths/Protective Factors: Social connections, Sense of purpose,  Physical Health (exercise, healthy diet, medication compliance, etc.), and Parental Resilience  Goals Addressed: Patient will:  Reduce symptoms of: anxiety, depression, and stress   Increase knowledge and/or ability of: self-management skills   Demonstrate ability to: Increase healthy adjustment to current life circumstances and Increase adequate support systems for patient/family  Progress towards Goals: Ongoing  Interventions: Interventions utilized:  Functional Assessment of ADLs and Supportive Reflection Standardized Assessments completed: Not Needed  Patient and/or Family Response: Patient agrees with treatment plan.   Assessment: Patient currently experiencing Adjustment disorder with mixed depressed and anxious mood and Psychosocial stressors .   Patient may benefit from psychoeducation and brief therapeutic interventions regarding coping with symptoms of depression, anxiety, and life stress.  Plan: Follow up with behavioral health clinician on : Three weeks after visit with Dr.Duncan  Behavioral recommendations: Prioritize moments alone, -Continue prioritizing healthy self-care (regular meals, adequate rest; allowing practical help from supportive friends and family) until at least postpartum medical appointment; avoid migraine triggers by managing stress  Referral(s): [place] (In Clinic)  I discussed the assessment and treatment plan with the patient and/or parent/guardian. They were provided an opportunity to ask questions and all were answered. They agreed with the plan and demonstrated an understanding of the instructions.   They were advised to call back or seek an in-person evaluation if the symptoms worsen or if the condition fails to improve as anticipated.  [name]

## 2022-04-23 ENCOUNTER — Other Ambulatory Visit: Payer: Self-pay

## 2022-04-23 ENCOUNTER — Ambulatory Visit (INDEPENDENT_AMBULATORY_CARE_PROVIDER_SITE_OTHER): Payer: Self-pay | Admitting: Obstetrics and Gynecology

## 2022-04-23 ENCOUNTER — Other Ambulatory Visit (HOSPITAL_COMMUNITY)
Admission: RE | Admit: 2022-04-23 | Discharge: 2022-04-23 | Disposition: A | Discharge status: Home / Self Care | Payer: Self-pay | Source: Ambulatory Visit | Attending: Obstetrics and Gynecology | Admitting: Obstetrics and Gynecology

## 2022-04-23 ENCOUNTER — Encounter: Payer: Self-pay | Admitting: Obstetrics and Gynecology

## 2022-04-23 VITALS — BP 135/79 | HR 92 | Wt 192.0 lb

## 2022-04-23 DIAGNOSIS — Z23 Encounter for immunization: Secondary | ICD-10-CM

## 2022-04-23 DIAGNOSIS — Z3A14 14 weeks gestation of pregnancy: Secondary | ICD-10-CM

## 2022-04-23 DIAGNOSIS — Z641 Problems related to multiparity: Secondary | ICD-10-CM

## 2022-04-23 DIAGNOSIS — O0990 Supervision of high risk pregnancy, unspecified, unspecified trimester: Secondary | ICD-10-CM

## 2022-04-23 DIAGNOSIS — O09529 Supervision of elderly multigravida, unspecified trimester: Secondary | ICD-10-CM

## 2022-04-23 DIAGNOSIS — Z789 Other specified health status: Secondary | ICD-10-CM

## 2022-04-23 DIAGNOSIS — Z5941 Food insecurity: Secondary | ICD-10-CM

## 2022-04-23 DIAGNOSIS — O0992 Supervision of high risk pregnancy, unspecified, second trimester: Secondary | ICD-10-CM

## 2022-04-23 DIAGNOSIS — Z5982 Transportation insecurity: Secondary | ICD-10-CM

## 2022-04-23 DIAGNOSIS — G43909 Migraine, unspecified, not intractable, without status migrainosus: Secondary | ICD-10-CM

## 2022-04-23 DIAGNOSIS — O09522 Supervision of elderly multigravida, second trimester: Secondary | ICD-10-CM

## 2022-04-23 DIAGNOSIS — F32A Depression, unspecified: Secondary | ICD-10-CM

## 2022-04-23 MED ORDER — SERTRALINE HCL 25 MG PO TABS
25.0000 mg | ORAL_TABLET | Freq: Every day | ORAL | 3 refills | Status: DC
  Filled 2022-04-23 – 2022-05-02 (×2): qty 30, 30d supply, fill #0

## 2022-04-23 NOTE — Patient Instructions (Addendum)
Prevention Magnesium Glycinate '200mg'$  twice or three times day Vitamin B12 twice a day  Treatment Excedrin Tension

## 2022-04-23 NOTE — Progress Notes (Signed)
Patient reports frequent migraines with dizziness and blurry vision since getting pregnant

## 2022-04-24 LAB — CBC/D/PLT+RPR+RH+ABO+RUBIGG...
Antibody Screen: NEGATIVE
Basophils Absolute: 0 10*3/uL (ref 0.0–0.2)
Basos: 0 %
EOS (ABSOLUTE): 0.1 10*3/uL (ref 0.0–0.4)
Eos: 1 %
HCV Ab: NONREACTIVE
HIV Screen 4th Generation wRfx: NONREACTIVE
Hematocrit: 40.4 % (ref 34.0–46.6)
Hemoglobin: 13.9 g/dL (ref 11.1–15.9)
Hepatitis B Surface Ag: NEGATIVE
Immature Grans (Abs): 0 10*3/uL (ref 0.0–0.1)
Immature Granulocytes: 0 %
Lymphocytes Absolute: 2.6 10*3/uL (ref 0.7–3.1)
Lymphs: 27 %
MCH: 29.1 pg (ref 26.6–33.0)
MCHC: 34.4 g/dL (ref 31.5–35.7)
MCV: 85 fL (ref 79–97)
Monocytes Absolute: 0.6 10*3/uL (ref 0.1–0.9)
Monocytes: 6 %
Neutrophils Absolute: 6.3 10*3/uL (ref 1.4–7.0)
Neutrophils: 66 %
Platelets: 303 10*3/uL (ref 150–450)
RBC: 4.78 x10E6/uL (ref 3.77–5.28)
RDW: 12.4 % (ref 11.7–15.4)
RPR Ser Ql: NONREACTIVE
Rh Factor: POSITIVE
Rubella Antibodies, IGG: 2.46 index (ref >0.99)
WBC: 9.5 10*3/uL (ref 3.4–10.8)

## 2022-04-24 LAB — HEMOGLOBIN A1C
Est. average glucose Bld gHb Est-mCnc: 117 mg/dL
Hgb A1c MFr Bld: 5.7 % — ABNORMAL HIGH (ref 4.8–5.6)

## 2022-04-24 LAB — HCV INTERPRETATION

## 2022-04-25 ENCOUNTER — Telehealth: Payer: Self-pay | Admitting: General Practice

## 2022-04-25 LAB — CULTURE, OB URINE

## 2022-04-25 LAB — URINE CULTURE, OB REFLEX

## 2022-04-25 NOTE — Telephone Encounter (Signed)
-----   Message from Radene Gunning, MD sent at 04/24/2022  4:49 PM EDT ----- A1c is elevated. Pt should have early 2 hr gtt. I sent her a Comptroller but she is spanish speaking, so I would still call her.

## 2022-04-25 NOTE — Telephone Encounter (Signed)
Called patient with [name] assisting with spanish interpretation. Informed patient of results and scheduled 2 hr gtt for 10/12 @ 820.

## 2022-04-27 LAB — CYTOLOGY - PAP
Comment: NEGATIVE
Diagnosis: NEGATIVE
High risk HPV: NEGATIVE

## 2022-04-28 ENCOUNTER — Encounter: Payer: Self-pay | Admitting: Obstetrics and Gynecology

## 2022-04-28 LAB — PANORAMA PRENATAL TEST FULL PANEL:PANORAMA TEST PLUS 5 ADDITIONAL MICRODELETIONS: FETAL FRACTION: 7.5

## 2022-04-30 ENCOUNTER — Other Ambulatory Visit: Payer: Self-pay

## 2022-05-01 ENCOUNTER — Other Ambulatory Visit: Payer: Self-pay

## 2022-05-02 ENCOUNTER — Other Ambulatory Visit: Payer: Self-pay

## 2022-05-03 ENCOUNTER — Other Ambulatory Visit: Payer: Self-pay

## 2022-05-04 ENCOUNTER — Other Ambulatory Visit: Payer: Self-pay

## 2022-05-09 ENCOUNTER — Other Ambulatory Visit: Payer: Self-pay | Admitting: General Practice

## 2022-05-09 DIAGNOSIS — O0990 Supervision of high risk pregnancy, unspecified, unspecified trimester: Secondary | ICD-10-CM

## 2022-05-10 ENCOUNTER — Other Ambulatory Visit: Payer: Self-pay

## 2022-05-10 ENCOUNTER — Other Ambulatory Visit: Payer: Self-pay | Admitting: Obstetrics and Gynecology

## 2022-05-10 ENCOUNTER — Ambulatory Visit: Payer: Self-pay | Admitting: Clinical

## 2022-05-10 DIAGNOSIS — O0990 Supervision of high risk pregnancy, unspecified, unspecified trimester: Secondary | ICD-10-CM

## 2022-05-10 DIAGNOSIS — F4323 Adjustment disorder with mixed anxiety and depressed mood: Secondary | ICD-10-CM

## 2022-05-10 MED ORDER — PROCHLORPERAZINE MALEATE 10 MG PO TABS
10.0000 mg | ORAL_TABLET | Freq: Four times a day (QID) | ORAL | 0 refills | Status: DC | PRN
  Filled 2022-05-10: qty 30, 8d supply, fill #0

## 2022-05-10 MED ORDER — EXCEDRIN TENSION HEADACHE 500-65 MG PO TABS
2.0000 | ORAL_TABLET | Freq: Four times a day (QID) | ORAL | 0 refills | Status: DC | PRN
  Filled 2022-05-10: qty 60, 8d supply, fill #0

## 2022-05-10 MED ORDER — DIPHENHYDRAMINE HCL 25 MG PO CAPS
25.0000 mg | ORAL_CAPSULE | Freq: Four times a day (QID) | ORAL | 0 refills | Status: DC | PRN
  Filled 2022-05-10: qty 30, 8d supply, fill #0

## 2022-05-10 NOTE — BH Specialist Note (Signed)
Integrated Behavioral Health Follow Up In-Person Visit  I reviewed patient visit with the [facility] Intern, and I concur with the treatment plan, as documented in the [facility] Intern note.   No charge for this visit due to [facility] Intern seeing patient.  Vesta Mixer, MSW, [place] for [place] at [facility]   MRN: [id number] Name: Regina Shea  Number of [place] Clinician visits: 3- Third Visit  Session Start time: [phone]   Session End time: 0905  Total time in minutes: 11   Types of Service: Individual psychotherapy  Interpretor:Yes.   Interpretor Name and Language: Alphonzo Lemmings Interpreter #[id number]  Subjective: Regina Shea is a 41 y.o. female accompanied by Spouse, who waited in the waiting area  Patient was referred by Beatrix Shipper for elevated PHQ9/ GAD7. Patient reports the following symptoms/concerns: Pt stated having no concerns after starting Zoloft on 9/25. Pt states anxiety levels have decreased but still has not received migraine medication and requested for [facility] Intern to follow-up on that.  Duration of problem: ongoing; Severity of problem: moderate  Objective: Mood: NA and Affect: Appropriate Risk of harm to self or others: No plan to harm self or others  Patient and/or Family's Strengths/Protective Factors: Social connections, Social and Emotional competence, and Concrete supports in place (healthy food, safe environments, etc.)  Goals Addressed: Patient will:  Reduce symptoms of: anxiety and stress   Increase knowledge and/or ability of: self-management skills and stress reduction   Demonstrate ability to: Increase healthy adjustment to current life circumstances and Improve medication compliance  Progress towards Goals: Ongoing  Interventions: Interventions utilized:  Supportive Counseling Standardized Assessments completed: GAD-7 and PHQ  9  Patient and/or Family Response:  Pt expressed feeling better since beginning Zoloft and having less concern about her anxiety. When discussing her chronic migraines pt states that she still needs her migraine medicine and requested for [facility] Intern to address that need. Pt was happy to have the support of her husband at her visit today.    Plan: Follow up with behavioral health clinician on : Pt stated that she would call [facility] Intern if she would like a follow-up visit.  Behavioral recommendations:  -Continue prioritizing healthy self-care (regular meals, adequate rest; allowing practical help from supportive friends and family)  -Consider new mom support group as needed at either [email or web address] or [email or web address]   Referral(s): [place] (In Clinic)  [place]

## 2022-05-11 LAB — GLUCOSE TOLERANCE, 2 HOURS W/ 1HR
Glucose, 1 hour: 181 mg/dL — ABNORMAL HIGH (ref 70–179)
Glucose, 2 hour: 140 mg/dL (ref 70–152)
Glucose, Fasting: 88 mg/dL (ref 70–91)

## 2022-05-16 ENCOUNTER — Telehealth: Payer: Self-pay | Admitting: *Deleted

## 2022-05-16 ENCOUNTER — Other Ambulatory Visit: Payer: Self-pay

## 2022-05-16 DIAGNOSIS — O24419 Gestational diabetes mellitus in pregnancy, unspecified control: Secondary | ICD-10-CM

## 2022-05-16 NOTE — Telephone Encounter (Addendum)
-----   Message from Radene Gunning, MD sent at 05/13/2022  2:33 PM EDT ----- 2 hr is c/w GDM. She needs to start checking CBGs and meet with Levada Dy.  Thanks, pad  10/18 1650  Called pt w/Regina Shea and informed her of test results as well as plan of care. Pt was advised that she will be taught how to check her own blood sugar and learn about the proper diet. In the mean time, she was asked to refrain from foods which have high sugar content. She voiced understanding and agreed toDiabetes Ed appt on 11/2 @ 1100.

## 2022-05-23 ENCOUNTER — Ambulatory Visit: Payer: Self-pay | Admitting: *Deleted

## 2022-05-23 ENCOUNTER — Other Ambulatory Visit: Payer: Self-pay | Admitting: *Deleted

## 2022-05-23 ENCOUNTER — Other Ambulatory Visit: Payer: Self-pay

## 2022-05-23 ENCOUNTER — Ambulatory Visit: Payer: Self-pay | Attending: Obstetrics and Gynecology

## 2022-05-23 ENCOUNTER — Ambulatory Visit (INDEPENDENT_AMBULATORY_CARE_PROVIDER_SITE_OTHER): Payer: Self-pay | Admitting: Obstetrics and Gynecology

## 2022-05-23 VITALS — BP 136/74 | HR 79 | Wt 196.0 lb

## 2022-05-23 VITALS — BP 123/66 | HR 103

## 2022-05-23 DIAGNOSIS — O0990 Supervision of high risk pregnancy, unspecified, unspecified trimester: Secondary | ICD-10-CM

## 2022-05-23 DIAGNOSIS — Z3A19 19 weeks gestation of pregnancy: Secondary | ICD-10-CM

## 2022-05-23 DIAGNOSIS — O209 Hemorrhage in early pregnancy, unspecified: Secondary | ICD-10-CM

## 2022-05-23 DIAGNOSIS — O09522 Supervision of elderly multigravida, second trimester: Secondary | ICD-10-CM

## 2022-05-23 DIAGNOSIS — Z641 Problems related to multiparity: Secondary | ICD-10-CM

## 2022-05-23 DIAGNOSIS — O24410 Gestational diabetes mellitus in pregnancy, diet controlled: Secondary | ICD-10-CM

## 2022-05-23 DIAGNOSIS — O09529 Supervision of elderly multigravida, unspecified trimester: Secondary | ICD-10-CM | POA: Insufficient documentation

## 2022-05-23 DIAGNOSIS — O24419 Gestational diabetes mellitus in pregnancy, unspecified control: Secondary | ICD-10-CM

## 2022-05-23 DIAGNOSIS — Z789 Other specified health status: Secondary | ICD-10-CM | POA: Insufficient documentation

## 2022-05-23 DIAGNOSIS — O0992 Supervision of high risk pregnancy, unspecified, second trimester: Secondary | ICD-10-CM

## 2022-05-23 HISTORY — DX: Gestational diabetes mellitus in pregnancy, unspecified control: O24.419

## 2022-05-23 NOTE — Progress Notes (Signed)
   PRENATAL VISIT NOTE  Subjective:  Regina Shea is a 41 y.o. T0P5465 at 60w0dbeing seen today for ongoing prenatal care.  She is currently monitored for the following issues for this high-risk pregnancy and has AMA (advanced maternal age) multigravida 340+ Language barrier; Lipoma; Supervision of high risk pregnancy, antepartum; Vaginal bleeding in pregnancy, first trimester; GHolcombmultipara; Migraines; Transportation insecurity; Food insecurity; and Gestational diabetes on their problem list.  Patient doing well with no acute concerns today. She reports no complaints.  Contractions: Not present. Vag. Bleeding: None.  Movement: Absent. Denies leaking of fluid.   The following portions of the patient's history were reviewed and updated as appropriate: allergies, current medications, past family history, past medical history, past social history, past surgical history and problem list. Problem list updated.  Objective:   Vitals:   05/23/22 1001  BP: 136/74  Pulse: 79  Weight: 196 lb (88.9 kg)    Fetal Status: Fetal Heart Rate (bpm): 155 Fundal Height: 19 cm Movement: Absent     General:  Alert, oriented and cooperative. Patient is in no acute distress.  Skin: Skin is warm and dry. No rash noted.   Cardiovascular: Normal heart rate noted  Respiratory: Normal respiratory effort, no problems with respiration noted  Abdomen: Soft, gravid, appropriate for gestational age.  Pain/Pressure: Absent     Pelvic: Cervical exam deferred        Extremities: Normal range of motion.  Edema: None  Mental Status:  Normal mood and affect. Normal behavior. Normal judgment and thought content.   Assessment and Plan:  Pregnancy: [id number] 181w0d1. [redacted] weeks gestation of pregnancy   2. Multigravida of advanced maternal age in second trimester Pt had anatomy scan this AM, formal read has not been completed at time of this writing  3. Language barrier Live interpreter  4. Supervision of  high risk pregnancy, antepartum Continue routine prenatal care BP currently borderline, questionable for [facility] closely  5. GrOnekamaultipara   6. Diet controlled gestational diabetes mellitus (GDM) in second trimester Pt recently diagnosed, briefly discussed proper dietary choice, pt has diabetic teaching on 11/2, glucose sheet given to patient  Preterm labor symptoms and general obstetric precautions including but not limited to vaginal bleeding, contractions, leaking of fluid and fetal movement were reviewed in detail with the patient.  Please refer to After Visit Summary for other counseling recommendations.   Return in about 4 weeks (around 06/20/2022) [facility].   LaLynnda ShieldsMD Faculty Attending Center for [facility]

## 2022-05-24 LAB — POCT URINALYSIS DIP (DEVICE)
Bilirubin Urine: NEGATIVE
Glucose, UA: NEGATIVE mg/dL
Hgb urine dipstick: NEGATIVE
Ketones, ur: NEGATIVE mg/dL
Leukocytes,Ua: NEGATIVE
Nitrite: NEGATIVE
Protein, ur: NEGATIVE mg/dL
Specific Gravity, Urine: 1.02 (ref 1.005–1.030)
Urobilinogen, UA: 0.2 mg/dL (ref 0.0–1.0)
pH: 6 (ref 5.0–8.0)

## 2022-05-31 ENCOUNTER — Ambulatory Visit (INDEPENDENT_AMBULATORY_CARE_PROVIDER_SITE_OTHER): Payer: Self-pay | Admitting: Registered"

## 2022-05-31 ENCOUNTER — Other Ambulatory Visit: Payer: Self-pay

## 2022-05-31 ENCOUNTER — Encounter: Payer: Self-pay | Attending: Nurse Practitioner | Admitting: Registered"

## 2022-05-31 DIAGNOSIS — O24419 Gestational diabetes mellitus in pregnancy, unspecified control: Secondary | ICD-10-CM | POA: Insufficient documentation

## 2022-05-31 DIAGNOSIS — O24410 Gestational diabetes mellitus in pregnancy, diet controlled: Secondary | ICD-10-CM

## 2022-05-31 DIAGNOSIS — Z713 Dietary counseling and surveillance: Secondary | ICD-10-CM | POA: Insufficient documentation

## 2022-05-31 NOTE — Progress Notes (Signed)
Patient was seen for Gestational Diabetes self-management on 05/31/2022  Start time [phone] and End time 0914   Estimated due date: 10/17/22; [redacted]w[redacted]d Clinical: Medications: reviewed Medical History: reviewed Labs: OGTT 1-hr 181 mg/dL, A1c 5.7%  (was up to 6.3% 1 yr ago)  Dietary and Lifestyle History: Pt states she is concerned about the effects of GDM on pregnancy. Pt reports family h/o T2D.   Pt states she was using vegetable oil for cooking but was switched to coconut oil because she thought it was healthier. RD briefly explained the role of fat in blood sugar and heart health.  Pt states she has switched to no-sugar creamer and sweetener in her coffee.   Pt states she makes a dish that includes beef, plantains, several vegetables and rice. Pt states she will try her current recipe and if BG is elevated will make adjustments to the carbohydrates in the meal.  Pt states her husband brings her to her appts and requested video visit for the follow-up appt  Physical Activity: not assessed Stress: not assessed Sleep: not assessed  24 hr Recall: not assessed this visit First Meal:  Snack: Second meal: Snack: Third meal: Snack: Beverages:  NUTRITION INTERVENTION  Nutrition education (E-1) on the following topics:   Initial Follow-up  '[x]'$  '[]'$  Definition of Gestational Diabetes '[x]'$  '[]'$  Why dietary management is important in controlling blood glucose '[x]'$  '[]'$  Effects each nutrient has on blood glucose levels '[]'$  '[]'$  Simple carbohydrates vs complex carbohydrates '[]'$  '[]'$  Fluid intake '[x]'$  '[]'$  Creating a balanced meal plan '[x]'$  '[]'$  Carbohydrate counting  '[x]'$  '[]'$  When to check blood glucose levels '[x]'$  '[]'$  Proper blood glucose monitoring techniques '[]'$  '[]'$  Effect of stress and stress reduction techniques  '[]'$  '[]'$  Exercise effect on blood glucose levels, appropriate exercise during pregnancy '[]'$  '[]'$  Importance of limiting caffeine and abstaining from alcohol and smoking '[]'$  '[]'$  Medications used for blood  sugar control during pregnancy '[]'$  '[]'$  Hypoglycemia and rule of 15 '[x]'$  '[]'$  Postpartum self care  Blood glucose monitor given: [employer] Lot # [id number] # [id number] Exp: 2023-09-23 CBG: 128 mg/dL (had coffee with sugar)  Patient instructed to monitor glucose levels: FBS: 60 - ? 95 mg/dL (some clinics use 90 for cutoff) 1 hour: ? 140 mg/dL 2 hour: ? 120 mg/dL  Patient received handouts: Nutrition Diabetes and Pregnancy Carbohydrate Counting List  Patient will be seen for follow-up as needed.

## 2022-06-14 ENCOUNTER — Encounter: Payer: Self-pay | Attending: Nurse Practitioner | Admitting: Registered"

## 2022-06-14 ENCOUNTER — Telehealth (INDEPENDENT_AMBULATORY_CARE_PROVIDER_SITE_OTHER): Payer: Self-pay | Admitting: Registered"

## 2022-06-14 DIAGNOSIS — Z3A00 Weeks of gestation of pregnancy not specified: Secondary | ICD-10-CM | POA: Insufficient documentation

## 2022-06-14 DIAGNOSIS — O24410 Gestational diabetes mellitus in pregnancy, diet controlled: Secondary | ICD-10-CM

## 2022-06-14 DIAGNOSIS — O24419 Gestational diabetes mellitus in pregnancy, unspecified control: Secondary | ICD-10-CM | POA: Insufficient documentation

## 2022-06-14 NOTE — Progress Notes (Addendum)
Virtual Visit via Video Note  I connected with Regina Shea on 06/14/22 at  8:15 AM EST by a video enabled telemedicine application and verified that I am speaking with the correct person using two identifiers. Start time 0820 and End time 0907   Location: Patient: home Provider: Pharmacist, hospital for Women, [place]   I discussed the limitations of evaluation and management by telemedicine and the availability of in person appointments. The patient expressed understanding and agreed to proceed.  Pt returns for GDM follow-up.   Estimated due date: 10/17/22; [redacted]w[redacted]d Clinical: Medications: reviewed Medical History: reviewed Labs: OGTT 1-hr 181 mg/dL, A1c 5.7%  (was up to 6.3% 1 yr ago)  SMBG since initial visit: FBS: 99-235 mg/dL PPBG: 97-179 mg/dL  Pt is likely eating recommended amount of carbs. Pt can make a few small changes to help blood sugar control, but she will most likely need medication to control. Her next Ob visit is in 4 days (06/18/22). Note will be sent to MD to see if they want to start medication before next Ob visit.    Dietary and Lifestyle History: Pt states there are some blank spaces because sometimes skips lunch still full from breakfast. Encouraged to eat smaller meals so she will not skips meals. Pt states she has been working to limit carbohydrates since last visit.  Pt states yesterday was worried about her son's skin condition and was at the hospital a lot of the day. Her high FBS (173 mg/dL) this morning could be d/t both diet and stress yesterday  Pt states she is also experiencing a lot of stress due to her teenage son's problems at school. Pt states she doesn't remember much from her first visit with [place] Pt states she is interested in a follow-up.  Pt reports transportation remains an issue and requests visits to be virtual.  Physical Activity: not assessed Stress: not assessed Sleep: not assessed  24 hr Recall:  First Meal:  bread, tKuwait ham, coffee  Snack: Second meal: Chinese small rice, chicken, spaghetti, broccoli Snack: macadamia nuts Third meal: (8:30) 2 tortilla, meat, lettuce, tomato Snack: Beverages: water, Sprite zero, coffee with truvia  NUTRITION INTERVENTION  Nutrition education (E-1) on the following topics:   Initial Follow-up  '[x]'$  '[]'$  Definition of Gestational Diabetes '[x]'$  '[]'$  Why dietary management is important in controlling blood glucose '[x]'$  '[]'$  Effects each nutrient has on blood glucose levels '[]'$  '[]'$  Simple carbohydrates vs complex carbohydrates '[]'$  '[]'$  Fluid intake '[x]'$  '[x]'$  Creating a balanced meal plan (don't skip meals) '[x]'$  '[]'$  Carbohydrate counting  '[x]'$  '[]'$  When to check blood glucose levels '[x]'$  '[]'$  Proper blood glucose monitoring techniques '[]'$  '[x]'$  Effect of stress and stress reduction techniques  '[]'$  '[]'$  Exercise effect on blood glucose levels, appropriate exercise during pregnancy '[]'$  '[]'$  Importance of limiting caffeine and abstaining from alcohol and smoking '[]'$  '[x]'$  Medications used for blood sugar control during pregnancy '[]'$  '[]'$  Hypoglycemia and rule of 15 '[x]'$  '[]'$  Postpartum self care  Patient instructed to monitor glucose levels: FBS: 60 - ? 95 mg/dL 1 hour: ? 140 mg/dL 2 hour: ? 120 mg/dL  Follow Up Instructions: Return for follow-up in 2 weeks (virtual)   I discussed the assessment and treatment plan with the patient. The patient was provided an opportunity to ask questions and all were answered. The patient agreed with the plan and demonstrated an understanding of the instructions.   The patient was advised to call back or seek an in-person evaluation if the symptoms worsen  or if the condition fails to improve as anticipated.  I provided 48 minutes of non-face-to-face time during this encounter. Levie Heritage, RD, LDN, CDCES

## 2022-06-15 ENCOUNTER — Other Ambulatory Visit: Payer: Self-pay | Admitting: Obstetrics and Gynecology

## 2022-06-15 DIAGNOSIS — O24415 Gestational diabetes mellitus in pregnancy, controlled by oral hypoglycemic drugs: Secondary | ICD-10-CM

## 2022-06-15 MED ORDER — METFORMIN HCL 500 MG PO TABS: 500.0000 mg | ORAL_TABLET | Freq: Two times a day (BID) | ORAL | 5 refills | Status: DC

## 2022-06-15 NOTE — Progress Notes (Signed)
Rx for metformin sent to pharmacy., BID dosing, will have nursing staff reach out for education.  Lynnda Shields, MD

## 2022-06-17 NOTE — Progress Notes (Unsigned)
   PRENATAL VISIT NOTE  Subjective:  Regina Shea is a 41 y.o. [id number] at 68w4dbeing seen today for ongoing prenatal care.  She is currently monitored for the following issues for this {Blank single:19197::"high-risk","low-risk"} pregnancy and has AMA (advanced maternal age) multigravida 356+ Language barrier; Lipoma; Supervision of high risk pregnancy, antepartum; Vaginal bleeding in pregnancy, first trimester; GHavanamultipara; Migraines; Transportation insecurity; Food insecurity; and Gestational diabetes on their problem list.  Patient reports {sx:14538}. ***   .  .   . Denies leaking of fluid.   The following portions of the patient's history were reviewed and updated as appropriate: allergies, current medications, past family history, past medical history, past social history, past surgical history and problem list.   Objective:  There were no vitals filed for this visit.  Fetal Status:           General:  Alert, oriented and cooperative. Patient is in no acute distress.  Skin: Skin is warm and dry. No rash noted.   Cardiovascular: Normal heart rate noted  Respiratory: Normal respiratory effort, no problems with respiration noted  Abdomen: Soft, gravid, appropriate for gestational age.        Pelvic: {Blank single:19197::"Cervical exam performed in the presence of a chaperone","Cervical exam deferred"}        Extremities: Normal range of motion.     Mental Status: Normal mood and affect. Normal behavior. Normal judgment and thought content.   Assessment and Plan:  Pregnancy: [id number] 270w4d1. Multigravida of advanced maternal age in second trimester  2. Language barrier  3. Supervision of high risk pregnancy, antepartum  4. Gestational diabetes mellitus (GDM) in second trimester controlled on oral hypoglycemic drug    {Blank single:19197::"Term","Preterm"} labor symptoms and general obstetric precautions including but not limited to vaginal bleeding,  contractions, leaking of fluid and fetal movement were reviewed in detail with the patient. Please refer to After Visit Summary for other counseling recommendations.   No follow-ups on file.  Future Appointments  Date Time Provider DeWhiteside11/20/2023  8:15 AM MeGeorgia DomMO[facility]11/22/2023 10:15 AM WMC-BEHAVIORAL HEALTH CLINICIAN [facility]MM[facility]11/28/2023  8:15 AM [facility] HospitalMC-CWH WML[facility]12/12/2021  7:45 AM WMC-MFC NURSE [facility]-MFC [facility]12/12/2021  8:00 AM WMC-MFC US1 WMC-MFCUS WMRiver BendDO FMOB Fellow, Faculty practice CoTierras Nuevas Ponienteor WoCross[facility] Hospit[facility] 06/17/22  8:07 PM

## 2022-06-18 ENCOUNTER — Telehealth: Payer: Self-pay

## 2022-06-18 ENCOUNTER — Encounter: Payer: Self-pay | Admitting: Family Medicine

## 2022-06-18 ENCOUNTER — Telehealth (INDEPENDENT_AMBULATORY_CARE_PROVIDER_SITE_OTHER): Payer: Self-pay | Admitting: Family Medicine

## 2022-06-18 VITALS — BP 130/72 | HR 92

## 2022-06-18 DIAGNOSIS — O0990 Supervision of high risk pregnancy, unspecified, unspecified trimester: Secondary | ICD-10-CM

## 2022-06-18 DIAGNOSIS — Z641 Problems related to multiparity: Secondary | ICD-10-CM

## 2022-06-18 DIAGNOSIS — O09522 Supervision of elderly multigravida, second trimester: Secondary | ICD-10-CM

## 2022-06-18 DIAGNOSIS — O0942 Supervision of pregnancy with grand multiparity, second trimester: Secondary | ICD-10-CM

## 2022-06-18 DIAGNOSIS — O24415 Gestational diabetes mellitus in pregnancy, controlled by oral hypoglycemic drugs: Secondary | ICD-10-CM

## 2022-06-18 DIAGNOSIS — Z3A22 22 weeks gestation of pregnancy: Secondary | ICD-10-CM

## 2022-06-18 DIAGNOSIS — O209 Hemorrhage in early pregnancy, unspecified: Secondary | ICD-10-CM

## 2022-06-18 DIAGNOSIS — Z789 Other specified health status: Secondary | ICD-10-CM

## 2022-06-18 NOTE — Telephone Encounter (Signed)
Called Pt using [place] id# [phone] to advise of Rx Metformin being sent to her pharmacy, no answer, left VM.

## 2022-06-26 ENCOUNTER — Telehealth (INDEPENDENT_AMBULATORY_CARE_PROVIDER_SITE_OTHER): Payer: Self-pay | Admitting: Registered"

## 2022-06-26 ENCOUNTER — Telehealth: Payer: Self-pay | Admitting: Registered"

## 2022-06-26 DIAGNOSIS — Z3A23 23 weeks gestation of pregnancy: Secondary | ICD-10-CM

## 2022-06-26 DIAGNOSIS — O24415 Gestational diabetes mellitus in pregnancy, controlled by oral hypoglycemic drugs: Secondary | ICD-10-CM

## 2022-06-26 NOTE — Progress Notes (Signed)
Virtual Visit via Video Note I connected with Odessa Sanchez-Herrera on 06/26/22 at  4:15 PM EST by a video enabled telemedicine application and verified that I am speaking with the correct person using two identifiers. Start time 1617 and End time 1634   AM[employer] Video interpreter Josue [phone]  Location: Patient: home Provider: [facility],    I discussed the limitations of evaluation and management by telemedicine and the availability of in person appointments. The patient expressed understanding and agreed to proceed.  Estimated due date: 10/17/22; [redacted]w[redacted]d Clinical: Medications: metformin 500 bid Rx 06/15/22  Medical History: reviewed Labs: OGTT 1-hr 181 mg/dL, A1c 5.7%  (was up to 6.3% 1 yr ago)  Last week SMBG: last 5 days are after starting Metformin 500 mg bid    Prior to starting metformin:   Dietary and Lifestyle History: Pt states she started taking metformin 5 days ago, didn't start sooner because she was confused about the prescription.   Pt reports she continues to avoid tortillas, limits rice, does not drink any sweetened beverages or juice.  Patient instructed to monitor glucose levels: FBS: 60 - ? 95 mg/dL; 2 hour: ? 120 mg/dL  Follow Up Instructions: Return for follow-up next week after seeing MD to discuss any changes that are made to your medication management.   I discussed the assessment and treatment plan with the patient. The patient was provided an opportunity to ask questions and all were answered. The patient agreed with the plan and demonstrated an understanding of the instructions.   The patient was advised to call back or seek an in-person evaluation if the symptoms worsen or if the condition fails to improve as anticipated.  I provided 17 minutes of non-face-to-face time during this encounter. ALevie Heritage RD, LDN, CDCES

## 2022-06-26 NOTE — Progress Notes (Signed)
Encounter opened in error  A user error has taken place: encounter opened in error, closed for administrative reasons.

## 2022-06-28 ENCOUNTER — Ambulatory Visit: Payer: Self-pay | Admitting: Clinical

## 2022-06-28 DIAGNOSIS — Z91199 Patient's noncompliance with other medical treatment and regimen due to unspecified reason: Secondary | ICD-10-CM

## 2022-06-28 NOTE — BH Specialist Note (Signed)
Left HIPPA-compliant message to call back Roselyn Reef from [employer] for [employer] at [facility] at  [phone] [facility] office).

## 2022-06-28 NOTE — BH Specialist Note (Signed)
Error

## 2022-07-04 ENCOUNTER — Ambulatory Visit: Payer: Self-pay | Attending: Obstetrics and Gynecology

## 2022-07-04 ENCOUNTER — Other Ambulatory Visit: Payer: Self-pay | Admitting: *Deleted

## 2022-07-04 ENCOUNTER — Ambulatory Visit: Payer: Self-pay | Admitting: *Deleted

## 2022-07-04 ENCOUNTER — Other Ambulatory Visit: Payer: Self-pay

## 2022-07-04 ENCOUNTER — Ambulatory Visit (INDEPENDENT_AMBULATORY_CARE_PROVIDER_SITE_OTHER): Payer: Self-pay | Admitting: Certified Nurse Midwife

## 2022-07-04 ENCOUNTER — Encounter: Payer: Self-pay | Attending: Nurse Practitioner | Admitting: Registered"

## 2022-07-04 ENCOUNTER — Ambulatory Visit (INDEPENDENT_AMBULATORY_CARE_PROVIDER_SITE_OTHER): Payer: Self-pay | Admitting: Registered"

## 2022-07-04 VITALS — BP 130/69 | HR 102

## 2022-07-04 VITALS — BP 123/75 | HR 91 | Wt 199.7 lb

## 2022-07-04 DIAGNOSIS — Z3A25 25 weeks gestation of pregnancy: Secondary | ICD-10-CM

## 2022-07-04 DIAGNOSIS — Z794 Long term (current) use of insulin: Secondary | ICD-10-CM | POA: Insufficient documentation

## 2022-07-04 DIAGNOSIS — O209 Hemorrhage in early pregnancy, unspecified: Secondary | ICD-10-CM | POA: Insufficient documentation

## 2022-07-04 DIAGNOSIS — Z363 Encounter for antenatal screening for malformations: Secondary | ICD-10-CM

## 2022-07-04 DIAGNOSIS — O24419 Gestational diabetes mellitus in pregnancy, unspecified control: Secondary | ICD-10-CM | POA: Insufficient documentation

## 2022-07-04 DIAGNOSIS — O24410 Gestational diabetes mellitus in pregnancy, diet controlled: Secondary | ICD-10-CM | POA: Insufficient documentation

## 2022-07-04 DIAGNOSIS — Z789 Other specified health status: Secondary | ICD-10-CM

## 2022-07-04 DIAGNOSIS — O24415 Gestational diabetes mellitus in pregnancy, controlled by oral hypoglycemic drugs: Secondary | ICD-10-CM

## 2022-07-04 DIAGNOSIS — Z641 Problems related to multiparity: Secondary | ICD-10-CM

## 2022-07-04 DIAGNOSIS — O09522 Supervision of elderly multigravida, second trimester: Secondary | ICD-10-CM | POA: Insufficient documentation

## 2022-07-04 DIAGNOSIS — E669 Obesity, unspecified: Secondary | ICD-10-CM

## 2022-07-04 DIAGNOSIS — O0992 Supervision of high risk pregnancy, unspecified, second trimester: Secondary | ICD-10-CM

## 2022-07-04 DIAGNOSIS — O0942 Supervision of pregnancy with grand multiparity, second trimester: Secondary | ICD-10-CM

## 2022-07-04 DIAGNOSIS — O99212 Obesity complicating pregnancy, second trimester: Secondary | ICD-10-CM

## 2022-07-04 MED ORDER — "INSULIN SYRINGE 31G X 5/16"" 0.5 ML MISC": 1.0000 | Freq: Two times a day (BID) | 6 refills | Status: DC

## 2022-07-04 MED ORDER — INSULIN NPH (HUMAN) (ISOPHANE) 100 UNIT/ML ~~LOC~~ SUSP: 7.5000 [IU] | Freq: Two times a day (BID) | SUBCUTANEOUS | 3 refills | Status: DC

## 2022-07-04 NOTE — Progress Notes (Signed)
   PRENATAL VISIT NOTE  Subjective:  Regina Shea is a 41 y.o. [id number] at 84w0dbeing seen today for ongoing prenatal care.  She is currently monitored for the following issues for this high-risk pregnancy and has AMA (advanced maternal age) multigravida 324+ Language barrier; Lipoma; Supervision of high-risk pregnancy; GGarvinmultipara; Migraines; Transportation insecurity; Food insecurity; and Gestational diabetes on their problem list.  Patient reports no complaints.  Contractions: Not present. Vag. Bleeding: None.  Movement: Present. Denies leaking of fluid.   The following portions of the patient's history were reviewed and updated as appropriate: allergies, current medications, past family history, past medical history, past social history, past surgical history and problem list.   Objective:   Vitals:   07/04/22 1120  BP: 123/75  Pulse: 91  Weight: 199 lb 11.2 oz (90.6 kg)    Fetal Status: Fetal Heart Rate (bpm): 150 Fundal Height: 25 cm Movement: Present     General:  Alert, oriented and cooperative. Patient is in no acute distress.  Skin: Skin is warm and dry. No rash noted.   Cardiovascular: Normal heart rate noted  Respiratory: Normal respiratory effort, no problems with respiration noted  Abdomen: Soft, gravid, appropriate for gestational age.  Pain/Pressure: Absent     Pelvic: Cervical exam deferred        Extremities: Normal range of motion.  Edema: None  Mental Status: Normal mood and affect. Normal behavior. Normal judgment and thought content.   Assessment and Plan:  Pregnancy: GA5W0981at 269w0d. Supervision of high risk pregnancy in second trimester - Doing well, feeling regular and vigorous fetal movement   2. [redacted] weeks gestation of pregnancy - Routine OB care   3. Gestational diabetes mellitus (GDM) in second trimester controlled on oral hypoglycemic drug - Brought glucose readings to RD appointment - after metformin start, many fastings still out  of range and post-prandials just above normal for several readings. RD recommended starting insulin. - Reviewed log with Dr. NeErnestina Patchesho recommended 7.5U NPH BID - orders placed and RD made aware for teaching  4. Language barrier - Spanish interpreter (Regina Shea) present for translation  Preterm labor symptoms and general obstetric precautions including but not limited to vaginal bleeding, contractions, leaking of fluid and fetal movement were reviewed in detail with the patient. Please refer to After Visit Summary for other counseling recommendations.   Return in about 2 weeks (around 07/18/2022) for IN-PERSON, [place] Future Appointments  Date Time Provider DeParshall12/12/2021  1:15 PM WMMngi Endoscopy Asc IncMChildren[facility] CenterMJeff[facility] [place]1/09/2022  8:30 AM WMC-MFC NURSE WMC-MFC [facility]1/09/2022  8:45 AM WMC-MFC US6 W[facility]MFCUS [facility]1/09/2022  1:15 PM ArWoodroe ModeMD WMSt. [facility] CenterM[facility]  JaGabriel CarinaCNM

## 2022-07-04 NOTE — Progress Notes (Unsigned)
Patient was seen for Gestational Diabetes self-management on 05/31/2022  Start time 0819 and End time 0914    Estimated due date: 10/17/22; [redacted]w[redacted]d  AMN Video interpreter AAnne Hahn##[id number] Clinical: Medications: Metformin 500 mg bid Medical History: reviewed Labs: OGTT 1-hr 181 mg/dL, A1c 5.7%  (was up to 6.3% 1 yr ago)  Estimated due date: 10/17/22; 271w0dPt forgot her most recent log at home but states her numbers continue to be about the same as before   After starting metformin 500 bid:   Prior to starting metformin:   Insulin instruction:

## 2022-07-09 ENCOUNTER — Telehealth: Payer: Self-pay | Admitting: Registered"

## 2022-07-09 ENCOUNTER — Other Ambulatory Visit: Payer: Self-pay | Admitting: *Deleted

## 2022-07-09 ENCOUNTER — Other Ambulatory Visit: Payer: Self-pay

## 2022-07-09 DIAGNOSIS — O24415 Gestational diabetes mellitus in pregnancy, controlled by oral hypoglycemic drugs: Secondary | ICD-10-CM

## 2022-07-09 MED ORDER — INSULIN NPH (HUMAN) (ISOPHANE) 100 UNIT/ML ~~LOC~~ SUSP
7.5000 [IU] | Freq: Two times a day (BID) | SUBCUTANEOUS | 3 refills | Status: DC
  Filled 2022-07-09: qty 10, 31d supply, fill #0

## 2022-07-09 NOTE — Telephone Encounter (Signed)
Med sent to [place]. Called patient with Rosemarie Ax assisting with spanish interpretation, no answer- unable to leave voicemail as it was not set up. Called alternate number and patient's husband answered stating he wasn't with her. Told him we sent the prescription she requested to her [place]. He verbalized understanding.

## 2022-07-09 NOTE — Addendum Note (Signed)
Addended by: Shelly Coss on: 07/09/2022 04:29 PM   Modules accepted: Orders

## 2022-07-09 NOTE — Telephone Encounter (Signed)
Called patient using Regina Shea [phone] to follow-up after starting insulin last Thurs, 12/7.  Pt requests that her pharmacy changed to [facility] so she can get a better price on insulin.  Pt states she is using insulin 7 units am and qhs. Pt states she is doing really good and has seen her blood sugars come down. FBS 110 and highest one time after meal was 128 mg. Pt was advised to continue keeping blood sugar log and take with her to MD visits.  Pt states she has no further questions.

## 2022-07-12 ENCOUNTER — Encounter: Payer: Self-pay | Admitting: Obstetrics and Gynecology

## 2022-07-16 ENCOUNTER — Encounter: Payer: Self-pay | Admitting: *Deleted

## 2022-07-16 ENCOUNTER — Other Ambulatory Visit: Payer: Self-pay

## 2022-07-30 NOTE — L&D Delivery Note (Incomplete Revision)
Delivery Note:   Regina Shea [id number] at [redacted]w[redacted]d Admitting diagnosis: GDM, class A2 [O24.419] Risks: AMA, Grandmultiparity, A2GDM  First Stage:  Induction of labor: A2GDM Onset of labor: 2/29 11:00 Augmentation: AROM, Pitocin, and Cytotec ROM: 09/27/22 at 1559 Analgesia /Anesthesia/Pain control intrapartum: Epidural   Second Stage: Complete dilation at 09/27/2022  1554  I was called to the room because patient was complete with a BB. Discussed AROM with patient. She agreed to procedure.   AROM performed with copious clear fluid. Patient started to feel increased pressure and desire to push. She was pushing effectively. Within several pushes infant delivered OA with restitution to maternal right and self delivery of the fetal right shoulder.  Infant was surprise and father was emotional and could not announce the sex. Announced it was a girl, mother shared name of Regina Shea   Pushing in lithotomy position with MD and L&D staff support, FOB/Husband Regina Grippresent for birth and supportive. Nuchal Cord: No  Delivery of a Live born female  Birth Weight:  Pending APGAR: 95 9  Newborn Delivery   Birth date/time: 09/27/2022 16:05:00 Delivery type:      Cord double clamped after cessation of pulsation by MD, cut by Regina Shea  Collection of cord blood for typing completed. Cord blood donation- No Arterial cord blood sample-Not collected    Third Stage:  Placenta delivered spontaneously, intact Uterine tone excellent bleeding minimal Uterotonics: none Placenta to LD.  Urethral laceration identified.   Repair: not repaired Est. Blood Loss (mL): 1123XX123Complications: None  Mom to postpartum.  Baby to Couplet care / Skin to Skin.  Delivery Report:  Review the Delivery Report for details.    Signed: KCaren Shea 09/27/2022, 4:53 PM

## 2022-07-30 NOTE — L&D Delivery Note (Addendum)
Delivery Note:   Regina Shea [id number] at [redacted]w[redacted]d Admitting diagnosis: GDM, class A2 [O24.419] Risks: AMA, Grandmultiparity, A2GDM  First Stage:  Induction of labor: A2GDM Onset of labor: 2/29 11:00  Augmentation: AROM, Pitocin, and Cytotec ROM: 09/27/22 at 1559 Analgesia /Anesthesia/Pain control intrapartum: Epidural   Second Stage: Complete dilation at 09/27/2022  1554  I was called to the room because patient was complete with a BB. Discussed AROM with patient. She agreed to procedure.   AROM performed with copious clear fluid. Patient started to feel increased pressure and desire to push. She was pushing effectively. Within several pushes infant delivered OA with restitution to maternal right and self delivery of the fetal right shoulder.  Infant was surprise and father was emotional and could not announce the sex. Announced it was a girl, mother shared name of Regina Shea   Pushing in lithotomy position with MD and L&D staff support, FOB/Husband Regina Grippresent for birth and supportive. Nuchal Cord: No  Delivery of a Live born female  Birth Weight:  2810g APGAR (1 MIN):  8 APGAR (5 MINS):  9    Newborn Delivery   Birth date/time: 09/27/2022 16:05:00 Delivery type:      Cord double clamped after cessation of pulsation by MD, cut by Regina Shea  Collection of cord blood for typing completed. Cord blood donation- No Arterial cord blood sample-Not collected    Third Stage:  Placenta delivered spontaneously, intact Uterine tone excellent bleeding minimal Uterotonics: none Placenta to LD.  Urethral laceration identified due to frank blood in foley. Noted to have balloon at urethral meatus. Balloon was deflated and red rubber passed to assure no blood from bladder. Held pressure to area and hemostasis was obtained. No repair necessary.  Discussed with patient. Low threshold for urogyn referral to urinary issues at poatpartum  Repair: not repaired Est. Blood Loss (mL):  [id number]: None  Mom to postpartum.  Baby to Couplet care / Skin to Skin.  Delivery Report:  Review the Delivery Report for details.    Signed: KCaren Macadam 09/27/2022, 4:53 PM

## 2022-08-01 ENCOUNTER — Encounter: Payer: Self-pay | Admitting: *Deleted

## 2022-08-01 ENCOUNTER — Other Ambulatory Visit: Payer: Self-pay | Admitting: *Deleted

## 2022-08-01 ENCOUNTER — Ambulatory Visit: Payer: Self-pay | Attending: Obstetrics

## 2022-08-01 ENCOUNTER — Ambulatory Visit: Payer: Self-pay | Admitting: *Deleted

## 2022-08-01 ENCOUNTER — Encounter: Payer: Self-pay | Admitting: Obstetrics & Gynecology

## 2022-08-01 VITALS — BP 145/75 | HR 94

## 2022-08-01 DIAGNOSIS — O09523 Supervision of elderly multigravida, third trimester: Secondary | ICD-10-CM

## 2022-08-01 DIAGNOSIS — O24419 Gestational diabetes mellitus in pregnancy, unspecified control: Secondary | ICD-10-CM

## 2022-08-01 DIAGNOSIS — Z641 Problems related to multiparity: Secondary | ICD-10-CM

## 2022-08-01 DIAGNOSIS — O99213 Obesity complicating pregnancy, third trimester: Secondary | ICD-10-CM

## 2022-08-01 DIAGNOSIS — O24414 Gestational diabetes mellitus in pregnancy, insulin controlled: Secondary | ICD-10-CM

## 2022-08-01 DIAGNOSIS — O0992 Supervision of high risk pregnancy, unspecified, second trimester: Secondary | ICD-10-CM | POA: Insufficient documentation

## 2022-08-01 DIAGNOSIS — Z3A29 29 weeks gestation of pregnancy: Secondary | ICD-10-CM

## 2022-08-01 DIAGNOSIS — O0943 Supervision of pregnancy with grand multiparity, third trimester: Secondary | ICD-10-CM

## 2022-08-01 DIAGNOSIS — E669 Obesity, unspecified: Secondary | ICD-10-CM

## 2022-08-07 ENCOUNTER — Encounter: Payer: Self-pay | Admitting: Family Medicine

## 2022-08-07 ENCOUNTER — Other Ambulatory Visit: Payer: Self-pay

## 2022-08-07 ENCOUNTER — Ambulatory Visit (INDEPENDENT_AMBULATORY_CARE_PROVIDER_SITE_OTHER): Payer: Self-pay | Admitting: Family Medicine

## 2022-08-07 VITALS — BP 115/63 | HR 118 | Wt 201.8 lb

## 2022-08-07 DIAGNOSIS — O24415 Gestational diabetes mellitus in pregnancy, controlled by oral hypoglycemic drugs: Secondary | ICD-10-CM

## 2022-08-07 DIAGNOSIS — O0992 Supervision of high risk pregnancy, unspecified, second trimester: Secondary | ICD-10-CM

## 2022-08-07 DIAGNOSIS — O24414 Gestational diabetes mellitus in pregnancy, insulin controlled: Secondary | ICD-10-CM

## 2022-08-07 DIAGNOSIS — Z789 Other specified health status: Secondary | ICD-10-CM

## 2022-08-07 DIAGNOSIS — O09523 Supervision of elderly multigravida, third trimester: Secondary | ICD-10-CM

## 2022-08-07 DIAGNOSIS — Z641 Problems related to multiparity: Secondary | ICD-10-CM

## 2022-08-07 MED ORDER — INSULIN NPH (HUMAN) (ISOPHANE) 100 UNIT/ML ~~LOC~~ SUSP
10.0000 [IU] | Freq: Two times a day (BID) | SUBCUTANEOUS | 3 refills | Status: DC
  Filled 2022-08-07: qty 10, 50d supply, fill #0

## 2022-08-07 MED ORDER — METFORMIN HCL 1000 MG PO TABS
1000.0000 mg | ORAL_TABLET | Freq: Two times a day (BID) | ORAL | 5 refills | Status: DC
  Filled 2022-08-07: qty 60, 30d supply, fill #0

## 2022-08-07 NOTE — Progress Notes (Signed)
   Subjective:  Regina Shea is a 42 y.o. [id number] at 53w6dbeing seen today for ongoing prenatal care.  She is currently monitored for the following issues for this high-risk pregnancy and has AMA (advanced maternal age) multigravida 359+ Language barrier; Lipoma; Supervision of high-risk pregnancy; GClearlake Oaksmultipara; Migraines; Transportation insecurity; Food insecurity; and Gestational diabetes on their problem list.  Patient reports no complaints.  Contractions: Irritability. Vag. Bleeding: None.  Movement: Present. Denies leaking of fluid.   The following portions of the patient's history were reviewed and updated as appropriate: allergies, current medications, past family history, past medical history, past social history, past surgical history and problem list. Problem list updated.  Objective:   Vitals:   08/07/22 1120  BP: 115/63  Pulse: (!) 118  Weight: 201 lb 12.8 oz (91.5 kg)    Fetal Status: Fetal Heart Rate (bpm): 161   Movement: Present     General:  Alert, oriented and cooperative. Patient is in no acute distress.  Skin: Skin is warm and dry. No rash noted.   Cardiovascular: Normal heart rate noted  Respiratory: Normal respiratory effort, no problems with respiration noted  Abdomen: Soft, gravid, appropriate for gestational age. Pain/Pressure: Present     Pelvic: Vag. Bleeding: None     Cervical exam deferred        Extremities: Normal range of motion.     Mental Status: Normal mood and affect. Normal behavior. Normal judgment and thought content.   Urinalysis:      Assessment and Plan:  Pregnancy: [id number] 214w6d1. Supervision of high risk pregnancy in second trimester BP and FHR normal No insurance, [facility], she reports her husband is going to get a vasectomy  He does not have an appt, and we discussed there was almost no way he would be able to get it in time at this point for it to be effective  postpartum She has used nexplanon in the past but did not like it We discussed other options, primarily pills and IUD We discussed post placental IUD as an option, and that we could do an IUD scholarship but it is a process and we should get started ASAP She will let usKoreanow at the next visit  2. Insulin controlled gestational diabetes mellitus (GDM) in third trimester On NPH 8u BID, not taking metformin Did not bring log, on recall fastings are 120-200's. Post prandials are 120's. Reports she has dinner around 2000 but then no further food or snacks Will increase NPH to 10u BID and resume metformin at 1g BID, follow up sugars at next visit Last growth US[place]/3/24, EFW 49%, AC 77%, AFI 17.7 Discussed risks of uncontrolled GDM, including macrosomia, problems at delivery, pp sugar issues/NICU admission for baby, and stillbirth. Emphasized importance of keeping good log and bringing it to the next visit  3. Multigravida of advanced maternal age in third trimester Antenatal testing per MFM  4. Grand multipara High hemorrhage risk, TXA at delivery  5. Language barrier Spanish  Preterm labor symptoms and general obstetric precautions including but not limited to vaginal bleeding, contractions, leaking of fluid and fetal movement were reviewed in detail with the patient. Please refer to After Visit Summary for other counseling recommendations.  Return in 2 weeks (on 08/21/2022) for HRGastroenterology Care Incob visit.   EcClarnce FlockMD

## 2022-08-07 NOTE — Patient Instructions (Signed)
Eleccin del mtodo anticonceptivo Contraception Choices La anticoncepcin, o los mtodos anticonceptivos, hace referencia a los mtodos o dispositivos que evitan el [place]. Mtodos hormonales  Implante anticonceptivo Un implante anticonceptivo consiste en un tubo delgado de plstico que contiene una hormona que evita el [place]. Es diferente de un dispositivo intrauterino (DIU). Un mdico lo inserta en la parte superior del brazo. Los implantes pueden ser eficaces durante un mximo de 3 aos. Inyecciones de progestina sola Las inyecciones de progestina sola contienen progestina, una forma sinttica de la hormona progesterona. Un mdico las administra cada 3 meses. Pldoras anticonceptivas Las pldoras anticonceptivas son pastillas que contienen hormonas que evitan el [place]. Deben tomarse una vez al da, preferentemente a [place]. Se necesita una receta para utilizar este mtodo anticonceptivo. Parche anticonceptivo El parche anticonceptivo contiene hormonas que evitan el [place]. Se coloca en la piel, debe cambiarse una vez a la semana durante tres semanas y debe retirarse en la cuarta semana. Se necesita una receta para utilizar este mtodo anticonceptivo. Anillo vaginal Un anillo vaginal contiene hormonas que evitan el embarazo. Se coloca en la vagina durante tres semanas y se retira en la cuarta semana. Luego se repite el proceso con un anillo nuevo. Se necesita una receta para utilizar este mtodo anticonceptivo. Anticonceptivo de emergencia Los anticonceptivos de emergencia son mtodos para evitar un embarazo despus de Best boy sexo sin proteccin. Vienen en forma de pldora y pueden tomarse hasta 5 das despus de [place]. Funcionan mejor cuando se toman lo ms pronto posible luego de [employer]. La mayora de los anticonceptivos de emergencia estn disponibles sin receta mdica. Este mtodo no debe utilizarse como el nico mtodo anticonceptivo. Mtodos de  barrera  Condn masculino Un condn masculino es una vaina delgada que se coloca sobre el pene durante el sexo. Los condones evitan que el esperma ingrese en el cuerpo de la [place]. Pueden utilizarse con un una sustancia que mata a los espermatozoides (espermicida) para aumentar la efectividad. Deben desecharse despus de un uso. Condn femenino Un condn femenino es una vaina blanda y holgada que se coloca en la vagina antes de [place]. El condn evita que el esperma ingrese en el cuerpo de la [place]. Deben desecharse despus de un uso. Diafragma Un diafragma es una barrera blanda con forma de cpula. Se inserta en la vagina antes del sexo, junto con un espermicida. El diafragma bloquea el ingreso de esperma en el tero, y el espermicida mata a los espermatozoides. El Designer, television/film set en la vagina durante 6 a 8 horas despus de Best boy sexo y debe retirarse en el plazo de las 24 horas. Un diafragma es recetado y colocado por un mdico. Debe reemplazarse cada 1 a 2 aos, despus de dar a luz, de aumentar ms de 15lb (6.8kg) y de [place] plvica. Capuchn cervical Un capuchn cervical es una copa redonda y blanda de ltex o plstico que se coloca en el cuello uterino. Se inserta en la vagina antes del sexo, junto con un espermicida. Bloquea el ingreso del esperma en el tero. El capuchn Medical illustrator durante 6 a 8 horas despus de Best boy sexo y debe retirarse en el plazo de las 48 horas. Un capuchn cervical debe ser recetado y colocado por un mdico. Debe reemplazarse cada 2aos. Esponja Una esponja es una pieza blanda y circular de espuma de poliuretano que contiene espermicida. La esponja ayuda a bloquear el ingreso de esperma en el tero, y el espermicida  mata a los espermatozoides. Regina Shea, debe humedecerla e insertarla en la vagina. Debe insertarse antes de [employer], debe permanecer dentro al menos durante 6 horas despus de tener sexo y debe retirarse y  Aeronautical engineer en el plazo de las 30 horas. Espermicidas Los espermicidas son sustancias qumicas que matan o bloquean al esperma y no lo dejan ingresar al cuello uterino y al tero. Vienen en forma de crema, gel, supositorio, espuma o comprimido. Un espermicida debe insertarse en la vagina con un aplicador al menos 10 o 15 minutos antes de tener sexo para dar tiempo a que surta [place]. El proceso debe repetirse cada vez que tenga sexo. Los espermicidas no requieren Furniture conservator/restorer. Anticonceptivos intrauterinos Dispositivo intrauterino (DIU) Un DIU es un dispositivo en forma de T que se coloca en el tero. Existen dos tipos: DIU hormonal.Este tipo contiene progestina, una forma sinttica de la hormona progesterona. Este tipo puede permanecer colocado durante 3 a 5 aos. DIU de cobre.Este tipo est recubierto con un alambre de cobre. Puede permanecer colocado durante 10 aos. Mtodos anticonceptivos permanentes Ligadura de trompas en la mujer En este mtodo, se sellan, atan u obstruyen las trompas de [place] durante una ciruga para Product/process development scientist que el vulo descienda [place]. Esterilizacin histeroscpica En este mtodo, se coloca un implante pequeo y flexible dentro de cada trompa de [place]op[place]. Los implantes hacen que se forme un tejido cicatricial en las trompas de [place]op[place] y que las obstruya para que el espermatozoide no pueda llegar al vulo. El procedimiento demora alrededor de 3 meses para que sea [place]. Debe utilizarse otro mtodo anticonceptivo durante esos 3 meses. Esterilizacin masculina Este es un procedimiento que consiste en atar los conductos que transportan el esperma (vasectoma). Luego del procedimiento, el hombre Equities trader lquido (semen). Debe utilizarse otro mtodo anticonceptivo durante 3 meses despus del procedimiento. Mtodos de planificacin natural Planificacin familiar natural En este mtodo, la pareja no tiene SP[employer] Corporation la mujer podra quedar  [place]. Mtodo calendario En este mtodo, la mujer realiza un seguimiento de la duracin de cada ciclo menstrual, identifica los [employer] que se puede producir un Media planner y no tiene sexo durante esos das. Mtodo de la ovulacin En este mtodo, la pareja evita tener sexo durante la ovulacin. Mtodo sintotrmico Este mtodo implica no tener sexo durante la ovulacin. Normalmente, la mujer comprueba la ovulacin al observar cambios en su temperatura y en la consistencia del moco cervical. Mtodo posovulacin En este mtodo, la pareja espera a que finalice la ovulacin para Adult nurse. Dnde buscar ms informacin Centers for Disease Control and Prevention (Centros para el Control y Publishing copy de Arboriculturist): http://www.wolf.info/ Resumen La anticoncepcin, o los mtodos anticonceptivos, hace referencia a los mtodos o dispositivos que evitan el [place]. Los mtodos anticonceptivos hormonales incluyen implantes, inyecciones, pastillas, parches, anillos vaginales y anticonceptivos de Freight forwarder. Los mtodos anticonceptivos de barrera pueden incluir condones masculinos, condones femeninos, diafragmas, capuchones cervicales, esponjas y espermicidas. Sears Holdings Corporation tipos de DIU (dispositivo intrauterino). Un DIU puede colocarse en el tero de una mujer para evitar el embarazo durante 3 a 5 aos. La esterilizacin permanente puede realizarse mediante un procedimiento tanto en los hombres como en las mujeres. Los NIKE de Marine scientist natural implican no tener SP[employer] Corporation la mujer podra quedar [place]. Esta informacin no tiene Marine scientist el consejo del mdico. Asegrese de hacerle al mdico cualquier pregunta que tenga. Document Revised: 02/16/2020 Document Reviewed: 02/16/2020 Elsevier Patient Education  2023 Elsevier Inc.  

## 2022-08-08 ENCOUNTER — Other Ambulatory Visit: Payer: Self-pay

## 2022-08-09 ENCOUNTER — Other Ambulatory Visit: Payer: Self-pay

## 2022-08-21 ENCOUNTER — Encounter: Payer: Self-pay | Admitting: Family Medicine

## 2022-08-22 ENCOUNTER — Other Ambulatory Visit: Payer: Self-pay | Admitting: Obstetrics and Gynecology

## 2022-08-22 ENCOUNTER — Ambulatory Visit (INDEPENDENT_AMBULATORY_CARE_PROVIDER_SITE_OTHER): Payer: Self-pay | Admitting: Obstetrics & Gynecology

## 2022-08-22 ENCOUNTER — Ambulatory Visit: Payer: Self-pay | Admitting: *Deleted

## 2022-08-22 ENCOUNTER — Ambulatory Visit: Payer: Self-pay

## 2022-08-22 ENCOUNTER — Other Ambulatory Visit: Payer: Self-pay

## 2022-08-22 ENCOUNTER — Ambulatory Visit: Payer: Self-pay | Attending: Obstetrics and Gynecology

## 2022-08-22 VITALS — BP 112/56 | HR 94

## 2022-08-22 VITALS — BP 128/78 | HR 91 | Wt 204.4 lb

## 2022-08-22 DIAGNOSIS — O09523 Supervision of elderly multigravida, third trimester: Secondary | ICD-10-CM | POA: Insufficient documentation

## 2022-08-22 DIAGNOSIS — O0993 Supervision of high risk pregnancy, unspecified, third trimester: Secondary | ICD-10-CM | POA: Insufficient documentation

## 2022-08-22 DIAGNOSIS — R9389 Abnormal findings on diagnostic imaging of other specified body structures: Secondary | ICD-10-CM | POA: Insufficient documentation

## 2022-08-22 DIAGNOSIS — Z641 Problems related to multiparity: Secondary | ICD-10-CM

## 2022-08-22 DIAGNOSIS — O99213 Obesity complicating pregnancy, third trimester: Secondary | ICD-10-CM | POA: Insufficient documentation

## 2022-08-22 DIAGNOSIS — O24415 Gestational diabetes mellitus in pregnancy, controlled by oral hypoglycemic drugs: Secondary | ICD-10-CM

## 2022-08-22 DIAGNOSIS — O24414 Gestational diabetes mellitus in pregnancy, insulin controlled: Secondary | ICD-10-CM

## 2022-08-22 DIAGNOSIS — O24419 Gestational diabetes mellitus in pregnancy, unspecified control: Secondary | ICD-10-CM

## 2022-08-22 DIAGNOSIS — Z3A32 32 weeks gestation of pregnancy: Secondary | ICD-10-CM

## 2022-08-22 DIAGNOSIS — E669 Obesity, unspecified: Secondary | ICD-10-CM

## 2022-08-22 DIAGNOSIS — O0943 Supervision of pregnancy with grand multiparity, third trimester: Secondary | ICD-10-CM

## 2022-08-22 MED ORDER — INSULIN NPH (HUMAN) (ISOPHANE) 100 UNIT/ML ~~LOC~~ SUSP
SUBCUTANEOUS | 3 refills | Status: DC
  Filled 2022-08-22: qty 10, 28d supply, fill #0

## 2022-08-22 NOTE — Progress Notes (Signed)
   PRENATAL VISIT NOTE  Subjective:  Regina Shea is a 42 y.o. [id number] at 32w0dbeing seen today for ongoing prenatal care.  She is currently monitored for the following issues for this high-risk pregnancy and has AMA (advanced maternal age) multigravida 36+ Language barrier; Lipoma; Supervision of high-risk pregnancy; GBankstonmultipara; Migraines; Transportation insecurity; Food insecurity; and Gestational diabetes on their problem list.  Patient reports occasional contractions.  Contractions: Irritability. Vag. Bleeding: None.  Movement: Present. Denies leaking of fluid.   The following portions of the patient's history were reviewed and updated as appropriate: allergies, current medications, past family history, past medical history, past social history, past surgical history and problem list.   Objective:   Vitals:   08/22/22 1336  BP: 128/78  Pulse: 91  Weight: 204 lb 6.4 oz (92.7 kg)    Fetal Status: Fetal Heart Rate (bpm): 154   Movement: Present     General:  Alert, oriented and cooperative. Patient is in no acute distress.  Skin: Skin is warm and dry. No rash noted.   Cardiovascular: Normal heart rate noted  Respiratory: Normal respiratory effort, no problems with respiration noted  Abdomen: Soft, gravid, appropriate for gestational age.  Pain/Pressure: Present     Pelvic: Cervical exam deferred        Extremities: Normal range of motion.     Mental Status: Normal mood and affect. Normal behavior. Normal judgment and thought content.   Assessment and Plan:  Pregnancy: [id number] 360w0d. GrPort Townsendultipara [id number] 2. Supervision of high risk pregnancy in third trimester   3. Multigravida of advanced maternal age in third trimester   4. Insulin controlled gestational diabetes mellitus (GDM) in third trimester   Media Information  Document Information  Photos    08/22/2022 13:57  Attached To:  Routine Prenatal on 08/22/22 with ArWoodroe ModeMD   Source Information  ArWoodroe ModeMD  [facility] HospitalIncrease insulin to control FBS  5. Gestational diabetes mellitus (GDM) in second trimester controlled on oral hypoglycemic drug  - insulin NPH Human (NOVOLIN N) 100 UNIT/ML injection; 12 units subcutaneous in the morning and 14 units at night  Dispense: 10 mL; Refill: 3  Preterm labor symptoms and general obstetric precautions including but not limited to vaginal bleeding, contractions, leaking of fluid and fetal movement were reviewed in detail with the patient. Please refer to After Visit Summary for other counseling recommendations.   Return in about 2 weeks (around 09/05/2022).  Future Appointments  Date Time Provider DeBlair1/24/2024  3:30 PM [facility]UR[facility]MD[facility]1/24/2024  3:45 PM WMC-MFC US4 [facility]MFCUS WMP[facility]2/07/2022  8:30 AM WMC-MFC NURSE [facility]-MFC WMMem[facility]2/07/2022  8:45 AM WMC-MFC US6 [facility]US [facility]2/01/2023  1:15 PM ArWoodroe ModeMD WMN[facility]aseM[facility]2/02/2023  8:15 AM WMC-MFC NURSE [facility]-MFC WMH[facility]2/02/2023  8:30 AM WMC-MFC US2 WMC-MFCUS WMLiberty Hospital2/14/2024  3:55 PM ArWoodroe ModeMD WMSelect [facility] Hospital [facility] San AntonioMPrisma [facility]2/21/2024  1:15 PM PiAletha HalimMD WMCedar Springs Behavioral Health SystemMMiddles[facility]  JaEmeterio ReeveMD

## 2022-08-22 NOTE — Procedures (Signed)
Regina Shea Dec 20, 1980 [redacted]w[redacted]d Fetus A Non-Stress Test Interpretation for 08/22/22  Indication: Unsatisfactory BPP  Fetal Heart Rate A Mode: External Baseline Rate (A): 130 bpm Variability: Moderate Accelerations: 15 x 15 Decelerations: None Multiple birth?: No  Uterine Activity Mode: Toco Contraction Frequency (min): No UC's Resting Tone Palpated: Relaxed  Interpretation (Fetal Testing) Nonstress Test Interpretation: Reactive Overall Impression: Reassuring for gestational age Comments: Tracing reviewed by Dr. SEpimenio Sarin

## 2022-08-30 ENCOUNTER — Ambulatory Visit: Payer: Self-pay | Attending: Obstetrics and Gynecology

## 2022-08-30 ENCOUNTER — Ambulatory Visit: Payer: Self-pay | Admitting: *Deleted

## 2022-08-30 ENCOUNTER — Other Ambulatory Visit: Payer: Self-pay | Admitting: *Deleted

## 2022-08-30 VITALS — BP 120/60 | HR 94

## 2022-08-30 DIAGNOSIS — Z641 Problems related to multiparity: Secondary | ICD-10-CM | POA: Insufficient documentation

## 2022-08-30 DIAGNOSIS — O99213 Obesity complicating pregnancy, third trimester: Secondary | ICD-10-CM | POA: Diagnosis present

## 2022-08-30 DIAGNOSIS — Z3A33 33 weeks gestation of pregnancy: Secondary | ICD-10-CM

## 2022-08-30 DIAGNOSIS — O09523 Supervision of elderly multigravida, third trimester: Secondary | ICD-10-CM | POA: Insufficient documentation

## 2022-08-30 DIAGNOSIS — O0943 Supervision of pregnancy with grand multiparity, third trimester: Secondary | ICD-10-CM

## 2022-08-30 DIAGNOSIS — O0993 Supervision of high risk pregnancy, unspecified, third trimester: Secondary | ICD-10-CM | POA: Diagnosis present

## 2022-08-30 DIAGNOSIS — O24414 Gestational diabetes mellitus in pregnancy, insulin controlled: Secondary | ICD-10-CM

## 2022-08-30 DIAGNOSIS — E669 Obesity, unspecified: Secondary | ICD-10-CM

## 2022-08-30 DIAGNOSIS — O24419 Gestational diabetes mellitus in pregnancy, unspecified control: Secondary | ICD-10-CM | POA: Insufficient documentation

## 2022-09-05 ENCOUNTER — Other Ambulatory Visit: Payer: Self-pay

## 2022-09-05 ENCOUNTER — Ambulatory Visit: Payer: Medicaid Other | Attending: Obstetrics and Gynecology

## 2022-09-05 ENCOUNTER — Ambulatory Visit (HOSPITAL_BASED_OUTPATIENT_CLINIC_OR_DEPARTMENT_OTHER): Payer: Medicaid Other

## 2022-09-05 ENCOUNTER — Ambulatory Visit (INDEPENDENT_AMBULATORY_CARE_PROVIDER_SITE_OTHER): Payer: Self-pay | Admitting: Obstetrics & Gynecology

## 2022-09-05 VITALS — BP 126/75 | HR 91 | Wt 199.6 lb

## 2022-09-05 VITALS — BP 126/72 | HR 107

## 2022-09-05 DIAGNOSIS — O24419 Gestational diabetes mellitus in pregnancy, unspecified control: Secondary | ICD-10-CM | POA: Insufficient documentation

## 2022-09-05 DIAGNOSIS — Z3A34 34 weeks gestation of pregnancy: Secondary | ICD-10-CM | POA: Diagnosis not present

## 2022-09-05 DIAGNOSIS — O0993 Supervision of high risk pregnancy, unspecified, third trimester: Secondary | ICD-10-CM

## 2022-09-05 DIAGNOSIS — E669 Obesity, unspecified: Secondary | ICD-10-CM

## 2022-09-05 DIAGNOSIS — O99213 Obesity complicating pregnancy, third trimester: Secondary | ICD-10-CM | POA: Diagnosis present

## 2022-09-05 DIAGNOSIS — O09523 Supervision of elderly multigravida, third trimester: Secondary | ICD-10-CM

## 2022-09-05 DIAGNOSIS — Z641 Problems related to multiparity: Secondary | ICD-10-CM | POA: Insufficient documentation

## 2022-09-05 DIAGNOSIS — O0943 Supervision of pregnancy with grand multiparity, third trimester: Secondary | ICD-10-CM | POA: Diagnosis not present

## 2022-09-05 DIAGNOSIS — O24415 Gestational diabetes mellitus in pregnancy, controlled by oral hypoglycemic drugs: Secondary | ICD-10-CM

## 2022-09-05 DIAGNOSIS — O24414 Gestational diabetes mellitus in pregnancy, insulin controlled: Secondary | ICD-10-CM

## 2022-09-05 DIAGNOSIS — Z789 Other specified health status: Secondary | ICD-10-CM

## 2022-09-05 MED ORDER — INSULIN NPH (HUMAN) (ISOPHANE) 100 UNIT/ML ~~LOC~~ SUSP
SUBCUTANEOUS | 3 refills | Status: DC
  Filled 2022-09-05: qty 10, 31d supply, fill #0

## 2022-09-05 NOTE — Progress Notes (Signed)
   PRENATAL VISIT NOTE  Subjective:  Regina Shea is a 42 y.o. [id number] at 62w0dbeing seen today for ongoing prenatal care.  She is currently monitored for the following issues for this high-risk pregnancy and has AMA (advanced maternal age) multigravida 341+ Language barrier; Lipoma; Supervision of high-risk pregnancy; GOak [place] Heightsmultipara; Migraines; Transportation insecurity; Food insecurity; and Gestational diabetes on their problem list.  Patient reports no complaints.  Contractions: Irritability. Vag. Bleeding: None.  Movement: Present. Denies leaking of fluid.   The following portions of the patient's history were reviewed and updated as appropriate: allergies, current medications, past family history, past medical history, past social history, past surgical history and problem list.   Objective:   Vitals:   09/05/22 1330  BP: 126/75  Pulse: 91  Weight: 199 lb 9.6 oz (90.5 kg)    Fetal Status: Fetal Heart Rate (bpm): 143   Movement: Present     General:  Alert, oriented and cooperative. Patient is in no acute distress.  Skin: Skin is warm and dry. No rash noted.   Cardiovascular: Normal heart rate noted  Respiratory: Normal respiratory effort, no problems with respiration noted  Abdomen: Soft, gravid, appropriate for gestational age.  Pain/Pressure: Present     Pelvic: Cervical exam deferred        Extremities: Normal range of motion.  Edema: None  Mental Status: Normal mood and affect. Normal behavior. Normal judgment and thought content.   Assessment and Plan:  Pregnancy: [id number] 339w0d. Supervision of high risk pregnancy in third trimester   2. Multigravida of advanced maternal age in third trimester   3. Language barrier   4. Insulin controlled gestational diabetes mellitus (GDM) in third trimester  Increase PM insulin 5. GrWaskomultipara   Preterm labor symptoms and general obstetric precautions including but not limited to vaginal bleeding,  contractions, leaking of fluid and fetal movement were reviewed in detail with the patient. Please refer to After Visit Summary for other counseling recommendations.   Return in about 1 week (around 09/12/2022).  Future Appointments  Date Time Provider DeMarkleeville2/14/2024  3:55 PM ArWoodroe ModeMD WMW[facility] CenterMA[facility]2/15/2024  7:15 AM WMC-MFC NURSE [facility]-MFC WMPac[facility]2/15/2024  7:30 AM WMC-MFC US2 [facility]MC-MFCUS WMReg[facility]2/21/2024  1:15 PM PiAletha HalimMD WMBanner [facility] Surgery [facility] LLCMRobert [facility]2/22/2024  7:30 AM WMC-MFC NURSE WMC-MFC WMGreen[facility]2/22/2024  7:45 AM WMC-MFC US5 WMC-MFCUS WMEye [facility]2/29/2024  7:30 AM WMC-MFC NURSE WMC-MFC WMEllicott City [facility] LlLP2/29/2024  7:45 AM WMC-MFC US7 WMC-MFCUS WMC    JaEmeterio ReeveMD

## 2022-09-06 ENCOUNTER — Ambulatory Visit: Payer: Self-pay

## 2022-09-12 ENCOUNTER — Other Ambulatory Visit: Payer: Self-pay

## 2022-09-12 ENCOUNTER — Encounter: Payer: Self-pay | Admitting: Obstetrics & Gynecology

## 2022-09-13 ENCOUNTER — Ambulatory Visit: Payer: Medicaid Other | Attending: Maternal & Fetal Medicine

## 2022-09-13 ENCOUNTER — Ambulatory Visit: Payer: Medicaid Other | Admitting: *Deleted

## 2022-09-13 VITALS — BP 110/63 | HR 101

## 2022-09-13 DIAGNOSIS — O0993 Supervision of high risk pregnancy, unspecified, third trimester: Secondary | ICD-10-CM | POA: Diagnosis present

## 2022-09-13 DIAGNOSIS — Z3A35 35 weeks gestation of pregnancy: Secondary | ICD-10-CM

## 2022-09-13 DIAGNOSIS — Z641 Problems related to multiparity: Secondary | ICD-10-CM

## 2022-09-13 DIAGNOSIS — O24419 Gestational diabetes mellitus in pregnancy, unspecified control: Secondary | ICD-10-CM | POA: Diagnosis not present

## 2022-09-13 DIAGNOSIS — O24414 Gestational diabetes mellitus in pregnancy, insulin controlled: Secondary | ICD-10-CM

## 2022-09-13 DIAGNOSIS — O0943 Supervision of pregnancy with grand multiparity, third trimester: Secondary | ICD-10-CM

## 2022-09-13 DIAGNOSIS — E669 Obesity, unspecified: Secondary | ICD-10-CM

## 2022-09-13 DIAGNOSIS — O99213 Obesity complicating pregnancy, third trimester: Secondary | ICD-10-CM

## 2022-09-13 DIAGNOSIS — O09523 Supervision of elderly multigravida, third trimester: Secondary | ICD-10-CM | POA: Insufficient documentation

## 2022-09-19 ENCOUNTER — Encounter: Payer: Self-pay | Admitting: Obstetrics and Gynecology

## 2022-09-20 ENCOUNTER — Ambulatory Visit: Payer: Medicaid Other | Admitting: *Deleted

## 2022-09-20 ENCOUNTER — Ambulatory Visit (INDEPENDENT_AMBULATORY_CARE_PROVIDER_SITE_OTHER): Payer: Self-pay | Admitting: Obstetrics and Gynecology

## 2022-09-20 ENCOUNTER — Ambulatory Visit: Payer: Medicaid Other | Attending: Maternal & Fetal Medicine

## 2022-09-20 ENCOUNTER — Other Ambulatory Visit: Payer: Self-pay

## 2022-09-20 ENCOUNTER — Other Ambulatory Visit (HOSPITAL_COMMUNITY)
Admission: RE | Admit: 2022-09-20 | Discharge: 2022-09-20 | Disposition: A | Discharge status: Home / Self Care | Payer: Self-pay | Source: Ambulatory Visit | Attending: Obstetrics and Gynecology | Admitting: Obstetrics and Gynecology

## 2022-09-20 ENCOUNTER — Encounter: Payer: Self-pay | Admitting: Obstetrics and Gynecology

## 2022-09-20 VITALS — BP 125/79 | HR 94 | Wt 199.8 lb

## 2022-09-20 VITALS — BP 126/76 | HR 107

## 2022-09-20 DIAGNOSIS — O24419 Gestational diabetes mellitus in pregnancy, unspecified control: Secondary | ICD-10-CM | POA: Insufficient documentation

## 2022-09-20 DIAGNOSIS — O0993 Supervision of high risk pregnancy, unspecified, third trimester: Secondary | ICD-10-CM | POA: Diagnosis present

## 2022-09-20 DIAGNOSIS — O132 Gestational [pregnancy-induced] hypertension without significant proteinuria, second trimester: Secondary | ICD-10-CM

## 2022-09-20 DIAGNOSIS — Z641 Problems related to multiparity: Secondary | ICD-10-CM

## 2022-09-20 DIAGNOSIS — O99213 Obesity complicating pregnancy, third trimester: Secondary | ICD-10-CM

## 2022-09-20 DIAGNOSIS — Z789 Other specified health status: Secondary | ICD-10-CM

## 2022-09-20 DIAGNOSIS — O09523 Supervision of elderly multigravida, third trimester: Secondary | ICD-10-CM | POA: Insufficient documentation

## 2022-09-20 DIAGNOSIS — O24415 Gestational diabetes mellitus in pregnancy, controlled by oral hypoglycemic drugs: Secondary | ICD-10-CM

## 2022-09-20 DIAGNOSIS — Z3A36 36 weeks gestation of pregnancy: Secondary | ICD-10-CM

## 2022-09-20 DIAGNOSIS — O139 Gestational [pregnancy-induced] hypertension without significant proteinuria, unspecified trimester: Secondary | ICD-10-CM | POA: Insufficient documentation

## 2022-09-20 DIAGNOSIS — Z5982 Transportation insecurity: Secondary | ICD-10-CM

## 2022-09-20 DIAGNOSIS — E669 Obesity, unspecified: Secondary | ICD-10-CM

## 2022-09-20 DIAGNOSIS — O0943 Supervision of pregnancy with grand multiparity, third trimester: Secondary | ICD-10-CM

## 2022-09-20 DIAGNOSIS — O133 Gestational [pregnancy-induced] hypertension without significant proteinuria, third trimester: Secondary | ICD-10-CM

## 2022-09-20 DIAGNOSIS — O320XX0 Maternal care for unstable lie, not applicable or unspecified: Secondary | ICD-10-CM | POA: Insufficient documentation

## 2022-09-20 DIAGNOSIS — Z6839 Body mass index (BMI) 39.0-39.9, adult: Secondary | ICD-10-CM

## 2022-09-20 DIAGNOSIS — O24414 Gestational diabetes mellitus in pregnancy, insulin controlled: Secondary | ICD-10-CM

## 2022-09-20 MED ORDER — INSULIN NPH (HUMAN) (ISOPHANE) 100 UNIT/ML ~~LOC~~ SUSP: SUBCUTANEOUS | 3 refills | Status: DC

## 2022-09-20 NOTE — Progress Notes (Signed)
PRENATAL VISIT NOTE  Subjective:  Regina Shea is a 42 y.o. [id number] at 110w1dbeing seen today for ongoing prenatal care.  She is currently monitored for the following issues for this high-risk pregnancy and has Obesity in pregnancy, antepartum, third trimester; BMI 39.0-39.9,adult; AMA (advanced maternal age) multigravida 389+ Language barrier; Lipoma; Supervision of high-risk pregnancy; GLynchburgmultipara; Migraines; Transportation insecurity; Food insecurity; Gestational diabetes; Unstable lie of fetus; and Transient hypertension of pregnancy on their problem list.  Patient reports no complaints.  Contractions: Irritability. Vag. Bleeding: None.  Movement: Present. Denies leaking of fluid.   The following portions of the patient's history were reviewed and updated as appropriate: allergies, current medications, past family history, past medical history, past social history, past surgical history and problem list.   Objective:   Vitals:   09/20/22 1312  BP: 125/79  Pulse: 94  Weight: 199 lb 12.8 oz (90.6 kg)    Fetal Status: Fetal Heart Rate (bpm): 144   Movement: Present  Presentation: Vertex  General:  Alert, oriented and cooperative. Patient is in no acute distress.  Skin: Skin is warm and dry. No rash noted.   Cardiovascular: Normal heart rate noted  Respiratory: Normal respiratory effort, no problems with respiration noted  Abdomen: Soft, gravid, appropriate for gestational age.  Pain/Pressure: Present     Pelvic: Cervical exam performed in the presence of a chaperone Dilation: 1 Effacement (%): 0 Station: Ballotable  Extremities: Normal range of motion.  Edema: None  Mental Status: Normal mood and affect. Normal behavior. Normal judgment and thought content.   Assessment and Plan:  Pregnancy: [id number] 344w1d. Supervision of high risk pregnancy in third trimester Patient is self pay.  - Culture, beta strep (group b only) - Cervicovaginal ancillary only( [facility])  2. Gestational diabetes mellitus (GDM) in second trimester controlled on oral hypoglycemic drug Patient confirms on metformin 1000/1000 and NPH 16/18. AM fasting in the 110s and 2h post prandials 150s-170s. Recommending increasing NPH to 19 and 21. Pt amenable to 37wk IOL>>set up for 2/28 at 2345 2/22: ceph, afi 14, 8/8 2/1: 71%, 2350g, ac 98%, breech, afi 17, 8/8 - insulin NPH Human (NOVOLIN N) 100 UNIT/ML injection; Inject 0.19 mLs (19 Units total) into the skin in the morning AND 0.21 mLs (21 Units total) at bedtime.  Dispense: 10 mL; Refill: 3  3. Transient hypertension of pregnancy in second trimester Normal today  4. Unstable fetal lie, single or unspecified fetus Scan on admit. Cephalic today  5. Transportation insecurity SW consult PP  6. Obesity in pregnancy, antepartum, third trimester  7. BMI 39.0-39.9,adult  8. Multigravida of advanced maternal age in third trimester No issues  9. Grand multipara Active third stage management, consider L[place]eda  10. Language barrier In person interpreter used  Preterm labor symptoms and general obstetric precautions including but not limited to vaginal bleeding, contractions, leaking of fluid and fetal movement were reviewed in detail with the patient. Please refer to After Visit Summary for other counseling recommendations.   No follow-ups on file.  Future Appointments  Date Time Provider DeMaury2/29/2024  7:30 AM WMC-MFC NURSE WMGw[facility] PcMC[facility]2/29/2024  7:45 AM WMC-MFC US7 WMC-MFCUS WMWin[facility]3/07/2022 10:55 AM Constant, Peggy, MD WMLiberty Regional [facility] CenterMWest [facility]3/02/2023 10:15 AM NeCaren MacadamMD WMAustin Gi [facility] IMOrth[facility]3/14/2024  2:35 PM PiAletha HalimMD WMMercy [facility] West HospitalMInd[facility]3/21/2024  1:15 PM WMC-WOCA NST WMCo[facility] Surgery [facility] LPMNorth [facility]3/21/2024  2:15 PM EcClarnce FlockMD WMHealdsburg District HospitalMS[facility]  Aletha Halim, MD

## 2022-09-21 LAB — CERVICOVAGINAL ANCILLARY ONLY
Chlamydia: NEGATIVE
Comment: NEGATIVE
Comment: NORMAL
Neisseria Gonorrhea: NEGATIVE

## 2022-09-21 NOTE — Progress Notes (Signed)
Alert received through NCNotify system as follows: Patient identified as not having a recommended prenatal visit. Patient is 36 weeks and 1 days pregnant. Most recent prenatal visit was on 09/05/2022 at [place].   Chart review: Patient's last visit was on 09/20/2022.  No action needed.  Martina Sinner, RN 09/21/2022  9:48 AM

## 2022-09-22 ENCOUNTER — Other Ambulatory Visit: Payer: Self-pay | Admitting: Advanced Practice Midwife

## 2022-09-23 LAB — CULTURE, BETA STREP (GROUP B ONLY): Strep Gp B Culture: POSITIVE — AB

## 2022-09-24 ENCOUNTER — Encounter: Payer: Self-pay | Admitting: Obstetrics and Gynecology

## 2022-09-26 ENCOUNTER — Telehealth (HOSPITAL_COMMUNITY): Payer: Self-pay | Admitting: *Deleted

## 2022-09-26 ENCOUNTER — Other Ambulatory Visit: Payer: Self-pay | Admitting: Advanced Practice Midwife

## 2022-09-26 DIAGNOSIS — O24419 Gestational diabetes mellitus in pregnancy, unspecified control: Secondary | ICD-10-CM

## 2022-09-26 NOTE — Telephone Encounter (Signed)
Preadmission screen  

## 2022-09-27 ENCOUNTER — Inpatient Hospital Stay (HOSPITAL_COMMUNITY): Payer: Medicaid Other

## 2022-09-27 ENCOUNTER — Inpatient Hospital Stay (HOSPITAL_COMMUNITY): Payer: Medicaid Other | Admitting: Anesthesiology

## 2022-09-27 ENCOUNTER — Inpatient Hospital Stay (HOSPITAL_COMMUNITY)
Admission: AD | Admit: 2022-09-27 | Discharge: 2022-09-29 | DRG: 807 | Disposition: A | Discharge status: Home / Self Care | Payer: Medicaid Other | Attending: Obstetrics & Gynecology | Admitting: Obstetrics & Gynecology

## 2022-09-27 ENCOUNTER — Encounter (HOSPITAL_COMMUNITY): Payer: Self-pay | Admitting: Obstetrics & Gynecology

## 2022-09-27 ENCOUNTER — Ambulatory Visit: Payer: Self-pay

## 2022-09-27 DIAGNOSIS — O24424 Gestational diabetes mellitus in childbirth, insulin controlled: Principal | ICD-10-CM | POA: Diagnosis present

## 2022-09-27 DIAGNOSIS — O99824 Streptococcus B carrier state complicating childbirth: Secondary | ICD-10-CM | POA: Diagnosis present

## 2022-09-27 DIAGNOSIS — O99820 Streptococcus B carrier state complicating pregnancy: Secondary | ICD-10-CM | POA: Diagnosis not present

## 2022-09-27 DIAGNOSIS — Z23 Encounter for immunization: Secondary | ICD-10-CM

## 2022-09-27 DIAGNOSIS — S3733XA Laceration of urethra, initial encounter: Secondary | ICD-10-CM

## 2022-09-27 DIAGNOSIS — O24419 Gestational diabetes mellitus in pregnancy, unspecified control: Secondary | ICD-10-CM

## 2022-09-27 DIAGNOSIS — O09529 Supervision of elderly multigravida, unspecified trimester: Secondary | ICD-10-CM

## 2022-09-27 DIAGNOSIS — O09523 Supervision of elderly multigravida, third trimester: Secondary | ICD-10-CM | POA: Diagnosis not present

## 2022-09-27 DIAGNOSIS — Z3A37 37 weeks gestation of pregnancy: Secondary | ICD-10-CM

## 2022-09-27 DIAGNOSIS — O0990 Supervision of high risk pregnancy, unspecified, unspecified trimester: Secondary | ICD-10-CM

## 2022-09-27 HISTORY — DX: Laceration of urethra, initial encounter: S37.33XA

## 2022-09-27 LAB — CBC
HCT: 37.8 % (ref 36.0–46.0)
Hemoglobin: 12.7 g/dL (ref 12.0–15.0)
MCH: 28.4 pg (ref 26.0–34.0)
MCHC: 33.6 g/dL (ref 30.0–36.0)
MCV: 84.6 fL (ref 80.0–100.0)
Platelets: 238 10*3/uL (ref 150–400)
RBC: 4.47 MIL/uL (ref 3.87–5.11)
RDW: 14.6 % (ref 11.5–15.5)
WBC: 9.9 10*3/uL (ref 4.0–10.5)
nRBC: 0 % (ref 0.0–0.2)

## 2022-09-27 LAB — GLUCOSE, CAPILLARY
Glucose-Capillary: 85 mg/dL (ref 70–99)
Glucose-Capillary: 162 mg/dL — ABNORMAL HIGH (ref 70–99)
Glucose-Capillary: 90 mg/dL (ref 70–99)
Glucose-Capillary: 107 mg/dL — ABNORMAL HIGH (ref 70–99)

## 2022-09-27 LAB — TYPE AND SCREEN
ABO/RH(D): A POS
Antibody Screen: NEGATIVE

## 2022-09-27 LAB — RPR: RPR Ser Ql: NONREACTIVE

## 2022-09-27 MED ORDER — ACETAMINOPHEN 325 MG PO TABS
ORAL_TABLET | ORAL | Status: AC
  Filled 2022-09-27: qty 2

## 2022-09-27 MED ORDER — LIDOCAINE-EPINEPHRINE (PF) 1.5 %-1:200000 IJ SOLN
INTRAMUSCULAR | Status: DC | PRN
  Administered 2022-09-27: 5 mL via EPIDURAL

## 2022-09-27 MED ORDER — INSULIN ASPART 100 UNIT/ML IJ SOLN
0.0000 [IU] | INTRAMUSCULAR | Status: DC
  Administered 2022-09-27: 3 [IU] via SUBCUTANEOUS
  Administered 2022-09-27: 1 [IU] via SUBCUTANEOUS

## 2022-09-27 MED ORDER — EPHEDRINE 5 MG/ML INJ: 10.0000 mg | INTRAVENOUS | Status: DC | PRN

## 2022-09-27 MED ORDER — ONDANSETRON HCL 4 MG/2ML IJ SOLN: 4.0000 mg | Freq: Four times a day (QID) | INTRAMUSCULAR | Status: DC | PRN

## 2022-09-27 MED ORDER — SIMETHICONE 80 MG PO CHEW: 80.0000 mg | CHEWABLE_TABLET | ORAL | Status: DC | PRN

## 2022-09-27 MED ORDER — OXYCODONE HCL 5 MG PO TABS: 10.0000 mg | ORAL_TABLET | ORAL | Status: DC | PRN

## 2022-09-27 MED ORDER — MISOPROSTOL 50MCG HALF TABLET
50.0000 ug | ORAL_TABLET | Freq: Once | ORAL | Status: AC
  Administered 2022-09-27: 50 ug via BUCCAL
  Filled 2022-09-27: qty 1

## 2022-09-27 MED ORDER — ACETAMINOPHEN 325 MG PO TABS: 650.0000 mg | ORAL_TABLET | ORAL | Status: DC | PRN

## 2022-09-27 MED ORDER — OXYCODONE-ACETAMINOPHEN 5-325 MG PO TABS: 2.0000 | ORAL_TABLET | ORAL | Status: DC | PRN

## 2022-09-27 MED ORDER — MISOPROSTOL 25 MCG QUARTER TABLET
25.0000 ug | ORAL_TABLET | Freq: Once | ORAL | Status: AC
  Administered 2022-09-27: 25 ug via VAGINAL
  Filled 2022-09-27: qty 1

## 2022-09-27 MED ORDER — OXYTOCIN BOLUS FROM INFUSION
333.0000 mL | Freq: Once | INTRAVENOUS | Status: AC
  Administered 2022-09-27: 333 mL via INTRAVENOUS

## 2022-09-27 MED ORDER — ONDANSETRON HCL 4 MG/2ML IJ SOLN: 4.0000 mg | INTRAMUSCULAR | Status: DC | PRN

## 2022-09-27 MED ORDER — MISOPROSTOL 50MCG HALF TABLET
50.0000 ug | ORAL_TABLET | Freq: Once | ORAL | Status: AC
  Administered 2022-09-27: 50 ug via ORAL
  Filled 2022-09-27: qty 1

## 2022-09-27 MED ORDER — TERBUTALINE SULFATE 1 MG/ML IJ SOLN: 0.2500 mg | Freq: Once | INTRAMUSCULAR | Status: DC | PRN

## 2022-09-27 MED ORDER — WITCH HAZEL-GLYCERIN EX PADS
1.0000 | MEDICATED_PAD | CUTANEOUS | Status: DC | PRN
  Administered 2022-09-27: 1 via TOPICAL

## 2022-09-27 MED ORDER — DIPHENHYDRAMINE HCL 25 MG PO CAPS: 25.0000 mg | ORAL_CAPSULE | Freq: Four times a day (QID) | ORAL | Status: DC | PRN

## 2022-09-27 MED ORDER — LACTATED RINGERS IV SOLN: INTRAVENOUS | Status: DC

## 2022-09-27 MED ORDER — FENTANYL-BUPIVACAINE-NACL 0.5-0.125-0.9 MG/250ML-% EP SOLN
12.0000 mL/h | EPIDURAL | Status: DC | PRN
  Administered 2022-09-27: 12 mL/h via EPIDURAL
  Filled 2022-09-27: qty 250

## 2022-09-27 MED ORDER — DOCUSATE SODIUM 100 MG PO CAPS
100.0000 mg | ORAL_CAPSULE | Freq: Two times a day (BID) | ORAL | Status: DC
  Administered 2022-09-27 – 2022-09-29 (×3): 100 mg via ORAL
  Filled 2022-09-27 (×3): qty 1

## 2022-09-27 MED ORDER — DIBUCAINE (PERIANAL) 1 % EX OINT: 1.0000 | TOPICAL_OINTMENT | CUTANEOUS | Status: DC | PRN

## 2022-09-27 MED ORDER — DIPHENHYDRAMINE HCL 50 MG/ML IJ SOLN: 12.5000 mg | INTRAMUSCULAR | Status: DC | PRN

## 2022-09-27 MED ORDER — ACETAMINOPHEN 325 MG PO TABS
650.0000 mg | ORAL_TABLET | ORAL | Status: DC | PRN
  Administered 2022-09-27 – 2022-09-29 (×3): 650 mg via ORAL
  Filled 2022-09-27 (×2): qty 2

## 2022-09-27 MED ORDER — ONDANSETRON HCL 4 MG PO TABS: 4.0000 mg | ORAL_TABLET | ORAL | Status: DC | PRN

## 2022-09-27 MED ORDER — PHENYLEPHRINE 80 MCG/ML (10ML) SYRINGE FOR IV PUSH (FOR BLOOD PRESSURE SUPPORT)
80.0000 ug | PREFILLED_SYRINGE | INTRAVENOUS | Status: DC | PRN
  Administered 2022-09-27: 80 ug via INTRAVENOUS

## 2022-09-27 MED ORDER — LACTATED RINGERS IV SOLN
500.0000 mL | Freq: Once | INTRAVENOUS | Status: AC
  Administered 2022-09-27: 500 mL via INTRAVENOUS

## 2022-09-27 MED ORDER — PENICILLIN G POT IN DEXTROSE 60000 UNIT/ML IV SOLN
3.0000 10*6.[IU] | INTRAVENOUS | Status: DC
  Administered 2022-09-27 (×3): 3 10*6.[IU] via INTRAVENOUS
  Filled 2022-09-27 (×3): qty 50

## 2022-09-27 MED ORDER — FENTANYL CITRATE (PF) 100 MCG/2ML IJ SOLN: 100.0000 ug | INTRAMUSCULAR | Status: DC | PRN

## 2022-09-27 MED ORDER — BENZOCAINE-MENTHOL 20-0.5 % EX AERO
1.0000 | INHALATION_SPRAY | CUTANEOUS | Status: DC | PRN
  Administered 2022-09-27: 1 via TOPICAL
  Filled 2022-09-27: qty 56

## 2022-09-27 MED ORDER — LIDOCAINE HCL (PF) 1 % IJ SOLN: 30.0000 mL | INTRAMUSCULAR | Status: DC | PRN

## 2022-09-27 MED ORDER — SODIUM CHLORIDE 0.9 % IV SOLN
5.0000 10*6.[IU] | Freq: Once | INTRAVENOUS | Status: AC
  Administered 2022-09-27: 5 10*6.[IU] via INTRAVENOUS
  Filled 2022-09-27: qty 5

## 2022-09-27 MED ORDER — PHENYLEPHRINE 80 MCG/ML (10ML) SYRINGE FOR IV PUSH (FOR BLOOD PRESSURE SUPPORT)
80.0000 ug | PREFILLED_SYRINGE | INTRAVENOUS | Status: DC | PRN
  Administered 2022-09-27: 80 ug via INTRAVENOUS
  Filled 2022-09-27: qty 10

## 2022-09-27 MED ORDER — COCONUT OIL OIL: 1.0000 | TOPICAL_OIL | Status: DC | PRN

## 2022-09-27 MED ORDER — OXYTOCIN-SODIUM CHLORIDE 30-0.9 UT/500ML-% IV SOLN
2.5000 [IU]/h | INTRAVENOUS | Status: DC
  Administered 2022-09-27: 2.5 [IU]/h via INTRAVENOUS

## 2022-09-27 MED ORDER — LACTATED RINGERS IV SOLN: 500.0000 mL | INTRAVENOUS | Status: DC | PRN

## 2022-09-27 MED ORDER — PRENATAL MULTIVITAMIN CH
1.0000 | ORAL_TABLET | Freq: Every day | ORAL | Status: DC
  Administered 2022-09-28 – 2022-09-29 (×2): 1 via ORAL
  Filled 2022-09-27 (×2): qty 1

## 2022-09-27 MED ORDER — SOD CITRATE-CITRIC ACID 500-334 MG/5ML PO SOLN: 30.0000 mL | ORAL | Status: DC | PRN

## 2022-09-27 MED ORDER — OXYCODONE HCL 5 MG PO TABS
5.0000 mg | ORAL_TABLET | ORAL | Status: DC | PRN
  Administered 2022-09-28: 5 mg via ORAL
  Filled 2022-09-27: qty 1

## 2022-09-27 MED ORDER — OXYCODONE-ACETAMINOPHEN 5-325 MG PO TABS: 1.0000 | ORAL_TABLET | ORAL | Status: DC | PRN

## 2022-09-27 MED ORDER — IBUPROFEN 600 MG PO TABS
600.0000 mg | ORAL_TABLET | Freq: Four times a day (QID) | ORAL | Status: DC
  Administered 2022-09-27 – 2022-09-29 (×7): 600 mg via ORAL
  Filled 2022-09-27 (×7): qty 1

## 2022-09-27 MED ORDER — OXYTOCIN-SODIUM CHLORIDE 30-0.9 UT/500ML-% IV SOLN
1.0000 m[IU]/min | INTRAVENOUS | Status: DC
  Administered 2022-09-27: 2 m[IU]/min via INTRAVENOUS
  Filled 2022-09-27: qty 500

## 2022-09-27 NOTE — Anesthesia Preprocedure Evaluation (Addendum)
Anesthesia Evaluation  Patient identified by MRN, date of birth, ID band Patient awake    Reviewed: Allergy & Precautions, Patient's Chart, lab work & pertinent test results  Airway Mallampati: II  TM Distance: >3 FB Neck ROM: Full    Dental no notable dental hx.    Pulmonary asthma    Pulmonary exam normal        Cardiovascular hypertension,  Rhythm:Regular Rate:Normal     Neuro/Psych  Headaches  negative psych ROS   GI/Hepatic negative GI ROS, Neg liver ROS,,,  Endo/Other  diabetes    Renal/GU negative Renal ROS  negative genitourinary   Musculoskeletal negative musculoskeletal ROS (+)    Abdominal Normal abdominal exam  (+)   Peds  Hematology negative hematology ROS (+)   Anesthesia Other Findings   Reproductive/Obstetrics (+) Pregnancy                             Anesthesia Physical Anesthesia Plan  ASA: 2  Anesthesia Plan: Epidural   Post-op Pain Management:    Induction:   PONV Risk Score and Plan: 2 and Treatment may vary due to age or medical condition  Airway Management Planned: Natural Airway  Additional Equipment: None  Intra-op Plan:   Post-operative Plan:   Informed Consent: I have reviewed the patients History and Physical, chart, labs and discussed the procedure including the risks, benefits and alternatives for the proposed anesthesia with the patient or authorized representative who has indicated his/her understanding and acceptance.     Dental advisory given and Interpreter used for interveiw  Plan Discussed with:   Anesthesia Plan Comments: (Lab Results      Component                Value               Date                      WBC                      9.9                 09/27/2022                HGB                      12.7                09/27/2022                HCT                      37.8                09/27/2022                MCV                       84.6                09/27/2022                PLT                      238  09/27/2022           )       Anesthesia Quick Evaluation

## 2022-09-27 NOTE — Progress Notes (Signed)
Labor Progress Note Ragene Ingrim is a 42 y.o. [id number] at 79w1dpresented for IOL due to gestational diabetes  S: Patient reports stronger, more frequent contractions.  O:  BP 121/76   Pulse 87   Temp 97.8 F (36.6 C) (Oral)   Resp 17   Ht 4' 11.84" (1.52 m)   Wt 92.1 kg   LMP 12/22/2021   BMI 39.85 kg/m  EFM: 140/moderate/accelerations present, decels absent  CVE: Dilation: 2.5 Effacement (%): 70 Station: -3 Presentation: Vertex Exam by:: Ndulue, MD   A&P: 42y.o. [id number] due to gestational diabetes. #Labor: Progressing well. S/p 2 doses cytotec. Pitocin started. #Pain: PRN IV meds, epidural upon request #FWB: Cat I #GBS positive  LyDarlen RoundMD PGY-1 10:51 AM

## 2022-09-27 NOTE — Anesthesia Procedure Notes (Signed)
Epidural Patient location during procedure: OB Start time: 09/27/2022 11:16 AM End time: 09/27/2022 11:24 AM  Staffing Anesthesiologist: Darral Dash, DO Performed: anesthesiologist   Preanesthetic Checklist Completed: patient identified, IV checked, site marked, risks and benefits discussed, surgical consent, monitors and equipment checked, pre-op evaluation and timeout performed  Epidural Patient position: sitting Prep: ChloraPrep Patient monitoring: heart rate, continuous pulse ox and blood pressure Approach: midline Location: L3-L4 Injection technique: LOR saline  Needle:  Needle type: Tuohy  Needle gauge: 17 G Needle length: 9 cm Needle insertion depth: 6 cm Catheter type: closed end flexible Catheter size: 20 Guage Catheter at skin depth: 11 cm Test dose: negative and 1.5% lidocaine with Epi 1:200 K  Assessment Events: blood not aspirated, no cerebrospinal fluid, injection not painful, no injection resistance and no paresthesia  Additional Notes   Patient identified. Risks/Benefits/Options discussed with patient including but not limited to bleeding, infection, nerve damage, paralysis, failed block, incomplete pain control, headache, blood pressure changes, nausea, vomiting, reactions to medications, itching and postpartum back pain. Confirmed with bedside nurse the patient's most recent platelet count. Confirmed with patient that they are not currently taking any anticoagulation, have any bleeding history or any family history of bleeding disorders. Patient expressed understanding and wished to proceed. All questions were answered. Sterile technique was used throughout the entire procedure. Please see nursing notes for vital signs. Test dose was given through epidural catheter and negative prior to continuing to dose epidural or start infusion. Warning signs of high block given to the patient including shortness of breath, tingling/numbness in hands, complete motor  block, or any concerning symptoms with instructions to call for help. Patient was given instructions on fall risk and not to get out of bed. All questions and concerns addressed with instructions to call with any issues or inadequate analgesia.    Reason for block:procedure for pain

## 2022-09-27 NOTE — H&P (Addendum)
OBSTETRIC ADMISSION HISTORY AND PHYSICAL  Alohilani Bridson is a 42 y.o. female [phone] with IUP at 17w1dby [place] for IOL due to Gestational diabetes. She reports +FMs, No LOF, no VB, no blurry vision, headaches or peripheral edema, and RUQ pain.  She plans on Breast feeding. She request copper IUD for birth control. [facility]  Dating: By [place]--->  Estimated Date of Delivery: 10/17/22  Sono:    '@[redacted]w[redacted]d'$ , CWD, normal anatomy, breech presentation, posterior placenta, 2350g, 71% EFW   Prenatal History/Complications: Gestational Diabetes, AMA, GBS+  Past Medical History: Past Medical History:  Diagnosis Date   Aneurysm ([place] 2012   never confirmed by tests   Asthma    h/o no inhalers   Chest pain 01/20/2019   Fistula-in-ano    Headache    migraines   Vision loss 2012   [place], they told her may be aneurysm, but never had follow up    Past Surgical History: Past Surgical History:  Procedure Laterality Date   NO PAST SURGERIES     RECTAL EXAM UNDER ANESTHESIA N/A 01/23/2019   Procedure: RECTAL EXAM UNDER ANESTHESIA, with FISTULOTOMY and  FIBRIN GLUE;  Surgeon: CFredirick Maudlin MD;  Location: [facility] ORS;  Service: General;  Laterality: N/A;    Obstetrical History: OB History     Gravida  7   Para  5   Term  5   Preterm  0   AB  1   Living  5      SAB  1   IAB  0   Ectopic  0   Multiple  0   Live Births  5           Social History Social History   Socioeconomic History   Marital status: Married    Spouse name: Not on file   Number of children: Not on file   Years of education: Not on file   Highest education level: Not on file  Occupational History   Not on file  Tobacco Use   Smoking status: Never   Smokeless tobacco: Never  Vaping Use   Vaping Use: Never used  Substance and Sexual Activity   Alcohol use: No   Drug use: No   Sexual activity: Yes    Birth  control/protection: None  Other Topics Concern   Not on file  Social History Narrative   Not on file   Social Determinants of Health   Financial Resource Strain: Not on file  Food Insecurity: Food Insecurity Present (03/27/2022)   Hunger Vital Sign    Worried About Running Out of Food in the Last Year: Sometimes true    Ran Out of Food in the Last Year: Sometimes true  Transportation Needs: No Transportation Needs (03/27/2022)   PRAPARE - THydrologist(Medical): No    Lack of Transportation (Non-Medical): No  Physical Activity: Not on file  Stress: Not on file  Social Connections: Not on file    Family History: Family History  Problem Relation Age of Onset   Hypertension Mother    Asthma Mother    Diabetes Father    Hypertension Father    Hypertension Brother    Cancer Maternal Uncle    Heart disease Neg Hx    Stroke Neg Hx     Allergies: No Known Allergies  Medications Prior to Admission  Medication Sig Dispense Refill  Last Dose   acetaminophen (TYLENOL) 500 MG tablet Take 2 tablets (1,000 mg total) by mouth every 6 (six) hours as needed (for pain scale < 4).      insulin NPH Human (NOVOLIN N) 100 UNIT/ML injection Inject 0.19 mLs (19 Units total) into the skin in the morning AND 0.21 mLs (21 Units total) at bedtime. 10 mL 3    Insulin Syringe-Needle U-100 (INSULIN SYRINGE .5CC/31GX5/16") 31G X 5/16" 0.5 ML MISC 1 Device by Does not apply route 2 (two) times daily at 8 am and 10 pm. 100 each 6    metFORMIN (GLUCOPHAGE) 1000 MG tablet Take 1 tablet (1,000 mg total) by mouth 2 (two) times daily with a meal. 60 tablet 5    Prenatal Vit-Fe Fumarate-FA (PRENATAL VITAMINS PO) Take 1 tablet by mouth daily.      sertraline (ZOLOFT) 25 MG tablet Take 1 tablet (25 mg total) by mouth daily. (Patient not taking: Reported on 08/30/2022) 30 tablet 3      Review of Systems   All systems reviewed and negative except as stated in HPI  Blood pressure  137/73, pulse 81, temperature 97.9 F (36.6 C), temperature source Oral, height 4' 11.84" (1.52 m), weight 92.1 kg, last menstrual period 12/22/2021, unknown if currently breastfeeding. General appearance: alert, cooperative, and no distress Lungs: clear to auscultation bilaterally Heart: regular rate and rhythm Abdomen: soft, non-tender; bowel sounds normal Pelvic: deferred  Extremities: Homans sign is negative, no sign of DVT Presentation: cephalic Fetal monitoringBaseline: 125 bpm, Variability: Good {> 6 bpm), Accelerations: Reactive, and Decelerations: Absent Uterine activity: none, IOL      Prenatal labs: ABO, Rh: --/--/PENDING (02/29 0025) Antibody: PENDING (02/29 0025) Rubella: 2.46 (09/25 1033) RPR: Non Reactive (09/25 1033)  HBsAg: Negative (09/25 1033)  HIV: Non Reactive (09/25 1033)  GBS: Positive/-- (02/22 1336)  1 hr Glucola : 181 Genetic screening  low risk Anatomy US Female  Prenatal Transfer Tool  Maternal Diabetes: Yes:  Diabetes Type:  Insulin/Medication controlled Genetic Screening: Normal Maternal Ultrasounds/Referrals: Normal Fetal Ultrasounds or other Referrals:  None Maternal Substance Abuse:  No Significant Maternal Medications:  None Significant Maternal Lab Results:  Group B Strep positive Number of Prenatal Visits:greater than 3 verified prenatal visits Other Comments:  None  Results for orders placed or performed during the hospital encounter of 09/27/22 (from the past 24 hour(s))  Type and screen   Collection Time: 09/27/22 12:25 AM  Result Value Ref Range   ABO/RH(D) PENDING    Antibody Screen PENDING    Sample Expiration      09/30/2022,2359 Performed at [facility], [place] [address], [place], [place] [address]     Patient Active Problem List   Diagnosis Date Noted   GDM, class A2 09/27/2022   Unstable lie of fetus 09/20/2022   Transient hypertension of pregnancy 09/20/2022   Gestational diabetes 05/23/2022   Migraines  04/18/2022   Transportation insecurity 04/18/2022   Food insecurity 04/18/2022   Supervision of high-risk pregnancy 03/27/2022   Grand multipara 03/27/2022   GBS (group B Streptococcus carrier), +RV culture, currently pregnant 11/30/2020   Lipoma 10/20/2020   AMA (advanced maternal age) multigravida 35+ 06/09/2020   Language barrier 06/09/2020   Obesity in pregnancy, antepartum, third trimester 07/16/2019   BMI 39.0-39.9,adult 07/16/2019    Assessment/Plan:  Haneen Ratts is a 42 y.o. [id number] at 53w1dhere for IOL secondary to gestational diabetes  #Labor: Start Cytotec, pitocin when appropriate #GDM: SSI #Pain: PRN IV meds per  pt request #FWB: Cat 1 #ID:  GBS+, 2 rounds IV PCN #MOF: breast #MOC: outpatient copper IUD   Augustin Coupe, [place]  09/27/2022, 12:48 AM  Fellow Attestation  I saw and evaluated the patient, performing the key elements of the service.I  personally performed or re-performed the history, physical exam, and medical decision making activities of this service and have verified that the service and findings are accurately documented in the resident's note. I developed the management plan that is described in the resident's note, and I agree with the content, with my edits above.    Gifford Shave, MD OB Fellow

## 2022-09-27 NOTE — Discharge Summary (Signed)
Postpartum Discharge Summary    Patient Name: Regina Shea DOB: 07-31-1980 MRN: [id number]  Date of admission: 09/27/2022 Delivery date:09/27/2022  Delivering provider: Caren Macadam  Date of discharge: 09/29/2022  Admitting diagnosis: GDM, class A2 [O24.419] Intrauterine pregnancy: [redacted]w[redacted]d    Secondary diagnosis:  Principal Problem:   GDM, class A2 Active Problems:   AMA (advanced maternal age) multigravida 42+   GBS (group B Streptococcus carrier), +RV culture, currently pregnant   Supervision of high-risk pregnancy   Vaginal delivery   Urethral laceration  Additional problems: n/a    Discharge diagnosis: Term Pregnancy Delivered                                              Post partum procedures: n/a Augmentation: AROM, Pitocin, and Cytotec Complications: None  Hospital course: Induction of Labor With Vaginal Delivery   42y.o. yo [id number] admitted to the hospital 09/27/2022 for induction of labor.  Indication for induction: A2 DM.  Patient had an labor course complicated by: none Membrane Rupture Time/Date: 3:59 PM ,09/27/2022   Delivery Method:Vaginal, Spontaneous  Episiotomy: None  Lacerations:  Periurethral  Details of delivery can be found in separate delivery note.  Patient had a postpartum course complicated by none. Patient is discharged home 09/29/22.  Newborn Data: Birth date:09/27/2022  Birth time:4:05 PM  Gender:Female  Living status:Living  Apgars:8 ,9  Weight:2810 g   Magnesium Sulfate received: No BMZ received: No Rhophylac:N/A MMR:N/A, Rubella Immune T-DaP:Given prenatally Flu: Given prenatally Transfusion:No  Physical exam  Vitals:   09/28/22 0829 09/28/22 1253 09/28/22 2100 09/29/22 0515  BP: 113/70 129/72 121/81 120/78  Pulse: 88 88 94 82  Resp: '18 16 18 18  '$ Temp: 97.9 F (36.6 C) 98 F (36.7 C) 97.7 F (36.5 C) 98 F (36.7 C)  TempSrc: Oral Oral Oral Oral  SpO2: 98% 99% 98% 99%  Weight:      Height:        General: alert, cooperative, and no distress Lochia: appropriate Uterine Fundus: firm Incision: N/A DVT Evaluation: No evidence of DVT seen on physical exam. Labs: Lab Results  Component Value Date   WBC 9.9 09/28/2022   HGB 12.1 09/28/2022   HCT 37.2 09/28/2022   MCV 86.7 09/28/2022   PLT 206 09/28/2022      Latest Ref Rng & Units 05/24/2021    4:34 PM  CMP  Glucose 70 - 99 mg/dL 92   BUN 6 - 24 mg/dL 13   Creatinine 0.57 - 1.00 mg/dL 0.68   Sodium 134 - 144 mmol/L 138   Potassium 3.5 - 5.2 mmol/L 4.3   Chloride 96 - 106 mmol/L 100   CO2 20 - 29 mmol/L 21   Calcium 8.7 - 10.2 mg/dL 9.7   Total Protein 6.0 - 8.5 g/dL 7.8   Total Bilirubin 0.0 - 1.2 mg/dL 0.4   Alkaline Phos 44 - 121 IU/L 147   AST 0 - 40 IU/L 23   ALT 0 - 32 IU/L 38    Edinburgh Score:    09/28/2022    8:29 AM  Edinburgh Postnatal Depression Scale Screening Tool  I have been able to laugh and see the funny side of things. 0  I have looked forward with enjoyment to things. 2  I have blamed myself unnecessarily when things went wrong. 2  I have  been anxious or worried for no good reason. 3  I have felt scared or panicky for no good reason. 3  Things have been getting on top of me. 0  I have been so unhappy that I have had difficulty sleeping. 0  I have felt sad or miserable. 3  I have been so unhappy that I have been crying. 2  The thought of harming myself has occurred to me. 0  Edinburgh Postnatal Depression Scale Total 15     After visit meds:  Allergies as of 09/29/2022   No Known Allergies      Medication List     STOP taking these medications    insulin NPH Human 100 UNIT/ML injection Commonly known as: NOVOLIN N   INSULIN SYRINGE .5CC/31GX5/16" 31G X 5/16" 0.5 ML Misc   metFORMIN 1000 MG tablet Commonly known as: GLUCOPHAGE   PRENATAL VITAMINS PO       TAKE these medications    acetaminophen 325 MG tablet Commonly known as: Tylenol Take 2 tablets (650 mg total) by  mouth every 4 (four) hours as needed (for pain scale < 4). What changed:  medication strength how much to take when to take this   benzocaine-Menthol 20-0.5 % Aero Commonly known as: DERMOPLAST Apply 1 Application topically as needed for irritation (perineal discomfort).   ibuprofen 600 MG tablet Commonly known as: ADVIL Take 1 tablet (600 mg total) by mouth every 6 (six) hours.   sertraline 25 MG tablet Commonly known as: Zoloft Tome 1 tableta (25 mg en total) por va oral diariamente. (Take 1 tablet (25 mg total) by mouth daily.)         Discharge home in stable condition Infant Feeding: Bottle and Breast Infant Disposition:home with mother Discharge instruction: per After Visit Summary and Postpartum booklet. Activity: Advance as tolerated. Pelvic rest for 6 weeks.  Diet: routine diet Future Appointments: Future Appointments  Date Time Provider Department Center  11/07/2022  8:15 AM Aletha Halim, MD [facility]  11/07/2022  8:50 AM [facility]   Follow up Visit:  The following message was sent to [facility] by Mikki Santee, MD  Please schedule this patient for a In person postpartum visit in 6 weeks with the following provider: Any provider. Additional Postpartum F/U:2 hour GTT in 6 weeks High risk pregnancy complicated by: GDM Delivery mode:  Vaginal, Spontaneous  Anticipated Birth Control:  IUD - out patient   09/29/2022 Shelda Pal, DO

## 2022-09-28 ENCOUNTER — Encounter: Payer: Self-pay | Admitting: Obstetrics and Gynecology

## 2022-09-28 LAB — CBC
HCT: 37.2 % (ref 36.0–46.0)
Hemoglobin: 12.1 g/dL (ref 12.0–15.0)
MCH: 28.2 pg (ref 26.0–34.0)
MCHC: 32.5 g/dL (ref 30.0–36.0)
MCV: 86.7 fL (ref 80.0–100.0)
Platelets: 206 10*3/uL (ref 150–400)
RBC: 4.29 MIL/uL (ref 3.87–5.11)
RDW: 14.9 % (ref 11.5–15.5)
WBC: 9.9 10*3/uL (ref 4.0–10.5)
nRBC: 0 % (ref 0.0–0.2)

## 2022-09-28 NOTE — Progress Notes (Signed)
POSTPARTUM PROGRESS NOTE  Post Partum Day 1  Subjective:  Regina Shea is a 42 y.o. [id number] s/p VD at [redacted]w[redacted]d  She reports she is doing well. No acute events overnight. She denies any problems with ambulating, voiding or po intake. Denies nausea or vomiting.  Pain is well controlled.  Lochia is adequate, + passing clots.  Objective: Blood pressure (!) 95/51, pulse 81, temperature 98 F (36.7 C), temperature source Oral, resp. rate 18, height 4' 11.84" (1.52 m), weight 92.1 kg, last menstrual period 12/22/2021, SpO2 98 %, unknown if currently breastfeeding.  Physical Exam:  General: alert, cooperative and no distress Chest: no respiratory distress Heart:regular rate, distal pulses intact Uterine Fundus: firm, appropriately tender DVT Evaluation: No calf swelling or tenderness Extremities: no edema Skin: warm, dry  Recent Labs    09/27/22 0025 09/28/22 0423  HGB 12.7 12.1  HCT 37.8 37.2    Assessment/Plan: Regina Shea a 42y.o. [id number]/p VD at 369w1d PPD#1 - Doing well  Routine postpartum care Depression: doing well, no meds Contraception: OP IUD Feeding: Breast/bottle Dispo: Plan for discharge today vs tomorrow.   LOS: 1 day   JeShelda PalDO OB Fellow  09/28/2022, 8:15 AM

## 2022-09-28 NOTE — Lactation Note (Signed)
This note was copied from a baby's chart. Lactation Consultation Note  Patient Name: Regina Shea [id number] Date: 09/28/2022 Age:42 hours Reason for consult: Initial assessment;Early term 37-38.6wks  Spanish Interpreter used Lorriane Shire- in person  Birth Parent attempted to latch infant on her right breast using the football hold, infant licked, tasted and latch but only held nipple in her mouth. LC reviewed hand expression with breast model and Birth Parent self expressed 7 mls of colostrum that was spoon and finger fed to infant by RN, afterwards LC assisted with pace feeding infant 8 mls of formula, infant was burped but did not spit up ( emesis) with this feeding, infant had total volume of 15 mls ( EBM/ formula). Birth Parent was set up with DEBP, and was expressing colostrum in breast flanges as LC left the room. Birth Parent knows EBM is safe at room temperature for 4 hours whereas formula must be used within 1 hour. Birth Parent will continue to latch infant 1st every feeding, 8+ time within 24 hours, STS and knows to ask RN/LC for further latch assistance if needed. Birth Parent was made aware of O/P services, breastfeeding support groups, community resources, and our phone # for post-discharge questions.    Birth Parent understands that infant feeding plan may change  Current feeding plan: 1- Birth Parent will continue to latch infant at the breast, 8+ time within 24 hours, Birth Parent will pre-pump breast with hand pump prior to latching infant at the breast and knows to ask RN/LC for further latch assistance if needed. 2- Birth Parent will offer infant any EBM 1st and then formula, Birth Parent plans to supplement infant after each feeding, if infant latch at breast will offer on Day 1 ( 5-7 mls ) of EBM/formula and if infant doesn't latch 10 mls or more if infant wants it per feeding, Birth Parent has BF supplemental guideline sheet. 3- Birth Parent will continue to use DEBP  every 3 hours for 15 minutes on initial setting and will offer infant any EBM first before formula.   Maternal Data Has patient been taught Hand Expression?: Yes Does the patient have breastfeeding experience prior to this delivery?: Yes How long did the patient breastfeed?: Birth Parent did not BF previous 5 child she pumped only 3 months and 4 months with 3rd and 4 th and with 5 th child exclusively for 6 months, Birth Parent's 5 th child is less than 13 years old.  Feeding Mother's Current Feeding Choice: Breast Milk and Formula  LATCH Score Latch: Too sleepy or reluctant, no latch achieved, no sucking elicited.  Audible Swallowing: None  Type of Nipple: Inverted  Comfort (Breast/Nipple): Soft / non-tender  Hold (Positioning): Assistance needed to correctly position infant at breast and maintain latch.  LATCH Score: 3   Lactation Tools Discussed/Used Tools: Pump;Flanges Flange Size: 21 Breast pump type: Double-Electric Breast Pump Pump Education: Setup, frequency, and cleaning;Milk Storage Reason for Pumping: Due infant not latching at the breast, Birth Parent has inverted nipples, infant is ETI Pumping frequency: Birth Parent was pumping when LC left the room and was expressing colostrum in breast flanges  Interventions Interventions: Breast feeding basics reviewed;Adjust position;Assisted with latch;Support pillows;Skin to skin;Position options;Breast massage;Expressed milk;Pre-pump if needed;Reverse pressure;Breast compression;LC Services brochure;Education;Pace feeding;DEBP  Discharge    Consult Status Consult Status: Follow-up Date: 09/28/22 Follow-up type: In-patient    Eulis Canner 09/28/2022, 1:01 AM

## 2022-09-28 NOTE — Lactation Note (Signed)
This note was copied from a baby's chart. Lactation Consultation Note  Patient Name: Regina Shea [id number] Date: 09/28/2022 Age:41 hours Reason for consult: Follow-up assessment;Early term 37-38.6wks;Breastfeeding assistance;Infant weight loss (3.2% WL)  In-house interpreter Raquel used for the consultation.  LC was called to the room by the RN.  The birth parent stated that the infant has been spitting up, but does well with the bottle.  LC attempted to assist the birth parent with putting the infant to the breast.  The infant had just fed and was uninterested in latching.  The birth parent will call when the infant is ready to feed.    Feeding Nipple Type: Slow - flow   Consult Status Consult Status: Follow-up Date: 09/28/22 Follow-up type: In-patient    Lysbeth Penner 09/28/2022, 3:26 PM

## 2022-09-28 NOTE — Anesthesia Postprocedure Evaluation (Signed)
Anesthesia Post Note  Patient: Regina Shea  Procedure(s) Performed: AN AD HOC LABOR EPIDURAL     Patient location during evaluation: Mother Baby Anesthesia Type: Epidural Level of consciousness: awake and alert and oriented Pain management: satisfactory to patient Vital Signs Assessment: post-procedure vital signs reviewed and stable Respiratory status: respiratory function stable Cardiovascular status: stable Postop Assessment: no headache, no backache, epidural receding, patient able to bend at knees, no signs of nausea or vomiting, adequate PO intake and able to ambulate Anesthetic complications: no   No notable events documented.  Last Vitals:  Vitals:   09/28/22 0430 09/28/22 0829  BP: (!) 95/51 113/70  Pulse: 81 88  Resp: 18 18  Temp: 36.7 C 36.6 C  SpO2:  98%    Last Pain:  Vitals:   09/28/22 0829  TempSrc: Oral  PainSc: 4    Pain Goal:                   Audie Wieser

## 2022-09-29 MED ORDER — IBUPROFEN 600 MG PO TABS
600.0000 mg | ORAL_TABLET | Freq: Four times a day (QID) | ORAL | 0 refills | Status: DC
  Filled 2022-09-29: qty 30, 8d supply, fill #0

## 2022-09-29 MED ORDER — BENZOCAINE-MENTHOL 20-0.5 % EX AERO
1.0000 | INHALATION_SPRAY | CUTANEOUS | 0 refills | Status: DC | PRN
  Filled 2022-09-29: qty 78, fill #0

## 2022-09-29 MED ORDER — TETANUS-DIPHTH-ACELL PERTUSSIS 5-2.5-18.5 LF-MCG/0.5 IM SUSY
0.5000 mL | PREFILLED_SYRINGE | Freq: Once | INTRAMUSCULAR | Status: AC
  Administered 2022-09-29: 0.5 mL via INTRAMUSCULAR

## 2022-09-29 MED ORDER — ACETAMINOPHEN 325 MG PO TABS
650.0000 mg | ORAL_TABLET | ORAL | 0 refills | Status: DC | PRN
  Filled 2022-09-29: qty 30, 3d supply, fill #0

## 2022-09-29 NOTE — Progress Notes (Signed)
CSW received consult for hx of Depression and Edinburgh Postnatal Depression Scale score of 15. CSW met with MOB to offer support and complete assessment. In house [place] interpreter, Spero Geralds accompanied CSW for the first half of the visit. CSW utilized [employer] interpreter service for second half of visit, interpreter [place], [id number]. When CSW entered room, MOB was observed sitting in hospital bed, holding and bonding with infant. CSW introduced self and explained reason for consult. MOB was agreeable to consult and remained engaged throughout encounter.   CSW inquired how MOB has felt emotionally since infant's delivery. MOB shares she does not feel depressed and does not feel like she has symptoms of depression at this time. CSW inquired how MOB felt emotionally during pregnancy. MOB recalls that she did not have any energy during the first 3-4 months of her pregnancy, recalling that she did not want to get out of bed. MOB attributed low energy to her diagnosis of gestational diabetes, sharing that she did not have gestational diabetes during her prior pregnancies, which made her pregnancy with infant more challenging compared to prior pregnancies. CSW reviewed MOB's Edinburgh Postnatal Depression Scale results. MOB stated that her husband completed the questionnaire for her. CSW revisted positive scores. MOB shared that she did feel nervous and worried prior to infant's delivery due to being induced 1 month prior to her due date. CSW normalized and validated MOB's emotions. MOB also shared that she sometimes endorses feelings of failure regarding her parenting decisions with her two oldest sons who are 45 and almost 34 years old. MOB shared about concerns regarding her sons seeking independence and how she questions her parenting decisions. MOB shares that when she worries about her children, she has migraines, which she first began experiencing as a child. CSW provided active listening and emotional  support. MOB explained that she stays at home with her 5 children (6 including infant) and has limited access to transportation due to relying on FOB for transportation needs, adding that he works full time. MOB shares that she has limited support and limited time to care for herself due to the demands of parenting. CSW validated MOB's experience and explored ways MOB can take realistic moments for herself to reduce stress.  CSW inquired about MOB's mental health history. MOB shares that her OBGYN prescribed Zoloft in the beginning of her pregnancy due to MOB feeling tired and not wanting to get out of bed. MOB states that she discontinued the medication during pregnancy. CSW inquired about therapy involvement. MOB states that she does not attend therapy but was supposed to meet with integrated behavioral health (IBH) therapist during her pregnancy; however, she was unable to make the appointment. CSW explained that virtual appointments are often an option and encouraged MOB to contact her OBGYN clinic if she would like to schedule a virtual IBH appointment in the future. CSW also provided MOB with contact information to [facility] and explained that the program can provide free in home therapy. MOB expressed appreciation for the resource. MOB denied current SI/HI/DV.  CSW provided education regarding the baby blues period vs. perinatal mood disorders, discussed treatment and gave resources for mental health follow up if concerns arise.  CSW recommends self-evaluation during the postpartum time period using the New Mom Checklist from Postpartum Progress and encouraged MOB to contact a medical professional if symptoms are noted at any time.    MOB reports she has a car seat and bassinet for infant but is in need of more diapers  and clothes. CSW provided information about [facility]) nurse home visiting program and offered to place referral, explaining that diapers and wipes can be provided  at initial appointment. MOB provided verbal consent for CSW to place [facility] referral. MOB has chosen [facility] [facility] [place] for infant's follow up care. MOB states she is connected with [facility]. CSW offered to provide MOB with a new baby bundle, MOB expressed appreciation.  CSW inquired about food insecurity and transportation barriers noted in MOB's medical chart. MOB reports she receives [facility] and food stamps and was agreeable to food bank resources, which CSW provided. MOB states that she has utilized Medicaid transportation in the past; however, the transportation service did not pick her up for one of her appointments and did not bring her home after another. MOB shares that she prefers for FOB to take her to appointments as needed. MOB denied additional resource needs at this time.  CSW provided review of Sudden Infant Death Syndrome (SIDS) precautions.    CSW identifies no further need for intervention and no barriers to discharge at this time.  Signed,  Berniece Salines, MSW, LCSWA, LCASA 09/29/2022 4:06 PM

## 2022-09-29 NOTE — Lactation Note (Signed)
This note was copied from a baby's chart. Lactation Consultation Note  Patient Name: Regina Shea [id number] Date: 09/29/2022 Age:42 hours  Reason for consult: Follow-up assessment;Breastfeeding assistance;Early term 37-38.6wks  P6, 37.1 GA, 7% weight loss, language barrier [facility] Interpreter, [place], present)  LC in to see mother of early term infant who is not sustaining latch to the breast. Mother's left nipple protrudes and right nipple inverts (dimples) with compression. Baby suckled a few times on the left breast then pulled away, arching and crying. Baby calmed quickly when given a bottle. Efforts were made with mother's consent to try a feeding tube and syringe secured to breast when latching but baby frustrated and the feeding effort stopped.    Mother reports that none of her babies would breastfeed, despite her efforts. She pumped for 6 months with her last baby.   Mother reports being established with [facility] and referral made for mother to get a DEBP and follow up [facility] appointment, with mother's consent.   Mother has pumped occasionally. Encouraged due to infant's feeding effort at the breast and being ETI, she needs to pump every 3 hours for 15 mintutes to stimulate milk production. Mother to feed her EBM first followed by formula.   Reviewed use of manual hand pump.    Feeding Mother's Current Feeding Choice: Breast Milk and Formula Nipple Type: Extra Slow Flow  LATCH Score Latch: Repeated attempts needed to sustain latch, nipple held in mouth throughout feeding, stimulation needed to elicit sucking reflex.  Audible Swallowing: None  Type of Nipple: Flat  Comfort (Breast/Nipple): Soft / non-tender  Hold (Positioning): Assistance needed to correctly position infant at breast and maintain latch.  LATCH Score: 5   Lactation Tools Discussed/Used Tools: Pump;Flanges Flange Size: 21 Breast pump type: Double-Electric Breast Pump;Manual Pump  Education: Setup, frequency, and cleaning;Milk Storage Reason for Pumping: ETI, not latching Pumping frequency: Instructed to pump every 3 hours  Interventions Interventions: Breast feeding basics reviewed;Assisted with latch;Breast massage;Hand express;Breast compression;Adjust position;Support pillows;DEBP;Education;Pace feeding;Position options;Skin to skin  Discharge Discharge Education: Warning signs for feeding baby;Engorgement and breast care;Other (comment) (referral made to [facility] for pump and LC OP follow up)  Consult Status Consult Status: Follow-up Date: 09/30/22 Follow-up type: In-patient    Stana Bunting M 09/29/2022, 11:34 AM

## 2022-10-01 ENCOUNTER — Other Ambulatory Visit: Payer: Self-pay

## 2022-10-01 ENCOUNTER — Telehealth (HOSPITAL_COMMUNITY): Payer: Self-pay | Admitting: *Deleted

## 2022-10-01 DIAGNOSIS — Z1331 Encounter for screening for depression: Secondary | ICD-10-CM

## 2022-10-01 NOTE — Telephone Encounter (Signed)
Inpatient EPDS=15. CSW consult completed during hospital stay. Ambulatory IBH referral made now and Dr. Dione Plover notified via chart.  Odis Hollingshead, RN 10-01-2022 at 2:47pm

## 2022-10-02 ENCOUNTER — Encounter: Payer: Self-pay | Admitting: *Deleted

## 2022-10-05 ENCOUNTER — Encounter: Payer: Self-pay | Admitting: Family Medicine

## 2022-10-08 ENCOUNTER — Other Ambulatory Visit: Payer: Self-pay

## 2022-10-09 ENCOUNTER — Telehealth (HOSPITAL_COMMUNITY): Payer: Self-pay | Admitting: *Deleted

## 2022-10-09 NOTE — Telephone Encounter (Signed)
Voicemail not setup. Unable to leave message.  Odis Hollingshead, RN 10-09-2022 at 11:04am

## 2022-10-11 ENCOUNTER — Encounter: Payer: Self-pay | Admitting: Obstetrics and Gynecology

## 2022-10-18 ENCOUNTER — Other Ambulatory Visit: Payer: Self-pay

## 2022-10-18 ENCOUNTER — Encounter: Payer: Self-pay | Admitting: Family Medicine

## 2022-10-18 ENCOUNTER — Ambulatory Visit: Payer: Self-pay | Admitting: Clinical

## 2022-10-18 DIAGNOSIS — Z91199 Patient's noncompliance with other medical treatment and regimen due to unspecified reason: Secondary | ICD-10-CM

## 2022-10-18 NOTE — BH Specialist Note (Signed)
Pt did not arrive to video visit and did not answer the phone; Left HIPPA-compliant message to call back [place] from [facility] at  [phone] (Jamie's office).   

## 2022-10-29 ENCOUNTER — Telehealth (HOSPITAL_COMMUNITY): Payer: Self-pay

## 2022-10-29 NOTE — Telephone Encounter (Signed)
Chart review.

## 2022-11-05 ENCOUNTER — Other Ambulatory Visit: Payer: Self-pay

## 2022-11-05 DIAGNOSIS — O24419 Gestational diabetes mellitus in pregnancy, unspecified control: Secondary | ICD-10-CM

## 2022-11-05 DIAGNOSIS — O24415 Gestational diabetes mellitus in pregnancy, controlled by oral hypoglycemic drugs: Secondary | ICD-10-CM

## 2022-11-07 ENCOUNTER — Ambulatory Visit (INDEPENDENT_AMBULATORY_CARE_PROVIDER_SITE_OTHER): Payer: Medicaid Other | Admitting: Obstetrics and Gynecology

## 2022-11-07 ENCOUNTER — Other Ambulatory Visit: Payer: Self-pay

## 2022-11-07 ENCOUNTER — Encounter: Payer: Self-pay | Admitting: Obstetrics and Gynecology

## 2022-11-07 DIAGNOSIS — O24415 Gestational diabetes mellitus in pregnancy, controlled by oral hypoglycemic drugs: Secondary | ICD-10-CM

## 2022-11-07 NOTE — Progress Notes (Addendum)
Post Partum Visit Note  Regina Shea is a 42 y.o. [id number] female who presents for a postpartum visit. She is 5 weeks postpartum following a normal spontaneous vaginal delivery.  I have fully reviewed the prenatal and intrapartum course. The delivery was at [redacted]w[redacted]d gestational weeks.  Anesthesia: epidural. Postpartum course has been uncomplicated. Baby is doing well. Baby is feeding by breast. Bleeding no bleeding. Bowel function is normal. Bladder function is normal. Patient is not sexually active. Contraception method is abstinence. Postpartum depression screening: negative.    The pregnancy intention screening data noted above was reviewed. Potential methods of contraception were discussed. Considering IUD but no insurance and concerned about cost -     Health Maintenance Due  Topic Date Due   COVID-19 Vaccine (3 - 2023-24 season) 03/30/2022    The following portions of the patient's history were reviewed and updated as appropriate: allergies, current medications, past family history, past medical history, past social history, past surgical history, and problem list.  Review of Systems Pertinent items are noted in HPI.  Objective:  LMP 12/22/2021    General:  alert, cooperative, and no distress   Breasts:  not indicated  Lungs: Normal respiratory effort  Wound N/a  GU exam:  not indicated       Assessment:   1. Postpartum state Doing well Would like IUD  - will find out if cost better at [facility], if not, will return for IUD insertion Has completed childbearing  2. Gestational diabetes mellitus (GDM) in second trimester controlled on oral hypoglycemic drug Not fasting today, will return for fasting PP 2hr GTT   Routine postpartum exam.   Plan:   Essential components of care per ACOG recommendations:  1.  Mood and well being: Patient with negative depression screening today. Reviewed local resources for support.  - Patient tobacco use? No.   - hx of drug  use? No.    2. Infant care and feeding:  -Patient currently breastmilk feeding? Yes  -Social determinants of health (SDOH) reviewed in EPIC. No concerns  3. Sexuality, contraception and birth spacing - Patient does not want a pregnancy in the next year.  Desired family size is 6 children.  - Reviewed reproductive life planning. Reviewed contraceptive methods based on pt preferences and effectiveness.  Patient desired IUD or IUS today.   - Discussed birth spacing of 18 months  4. Sleep and fatigue -Encouraged family/partner/community support of 4 hrs of uninterrupted sleep to help with mood and fatigue  5. Physical Recovery  - Discussed patients delivery and complications. She describes her labor as good. - Patient had a Vaginal, no problems at delivery. Patient had   no  laceration. Perineal healing reviewed. Patient expressed understanding - Patient has urinary incontinence? No. - Patient is safe to resume physical and sexual activity  6.  Health Maintenance - HM due items addressed Yes - Last pap smear  Diagnosis  Date Value Ref Range Status  04/23/2022   Final   - Negative for intraepithelial lesion or malignancy (NILM)   Pap smear not done at today's visit.  -Breast Cancer screening indicated? Yes. Patient referred today for mammogram.  BCCCP due to no insurance coverage  7. Chronic Disease/Pregnancy Condition follow up: Gestational Diabetes  - PCP follow up  Judd Gaudier, CMA [facility], [facility]  Lorriane Shire, MD, FACOG Minimally Invasive Gynecologic Surgery and Chronic Pelvic Pain Specialist Obstetrics and Gynecology, [facility] for [facility], [facility] 11/07/22

## 2022-11-07 NOTE — Patient Instructions (Signed)
Call the health department for IUD

## 2022-11-19 ENCOUNTER — Other Ambulatory Visit: Payer: Self-pay

## 2022-11-19 DIAGNOSIS — O24419 Gestational diabetes mellitus in pregnancy, unspecified control: Secondary | ICD-10-CM

## 2022-11-19 DIAGNOSIS — O24415 Gestational diabetes mellitus in pregnancy, controlled by oral hypoglycemic drugs: Secondary | ICD-10-CM

## 2022-11-20 LAB — GLUCOSE TOLERANCE, 2 HOURS
Glucose, 2 hour: 90 mg/dL (ref 70–139)
Glucose, GTT - Fasting: 82 mg/dL (ref 70–99)

## 2022-12-04 ENCOUNTER — Telehealth: Payer: Self-pay | Admitting: General Practice

## 2022-12-04 NOTE — Telephone Encounter (Signed)
-----   Message from Lorriane Shire, MD sent at 12/04/2022 11:33 AM EDT ----- Notify of normal postpartum GTT and does not have diabetes

## 2022-12-04 NOTE — Telephone Encounter (Signed)
Called patient with Regina Shea assisting with spanish interpretation and informed her of normal results. Patient verbalized understanding and asked if she had any other appts with [place]. Told patient no and reviewed upcoming appts with [facility]. Patient verbalized understanding.

## 2022-12-18 ENCOUNTER — Ambulatory Visit: Payer: Medicaid Other

## 2023-01-18 ENCOUNTER — Ambulatory Visit: Payer: Medicaid Other | Admitting: Nurse Practitioner

## 2023-09-02 ENCOUNTER — Ambulatory Visit: Payer: Self-pay | Attending: Nurse Practitioner | Admitting: Nurse Practitioner

## 2023-09-02 ENCOUNTER — Encounter: Payer: Self-pay | Admitting: Nurse Practitioner

## 2023-09-02 VITALS — BP 113/76 | HR 81 | Resp 20 | Ht 59.0 in | Wt 200.1 lb

## 2023-09-02 DIAGNOSIS — M549 Dorsalgia, unspecified: Secondary | ICD-10-CM

## 2023-09-02 DIAGNOSIS — D179 Benign lipomatous neoplasm, unspecified: Secondary | ICD-10-CM

## 2023-09-02 DIAGNOSIS — M542 Cervicalgia: Secondary | ICD-10-CM

## 2023-09-02 DIAGNOSIS — Z23 Encounter for immunization: Secondary | ICD-10-CM

## 2023-09-02 DIAGNOSIS — D72829 Elevated white blood cell count, unspecified: Secondary | ICD-10-CM

## 2023-09-02 DIAGNOSIS — Z8632 Personal history of gestational diabetes: Secondary | ICD-10-CM

## 2023-09-02 DIAGNOSIS — R635 Abnormal weight gain: Secondary | ICD-10-CM

## 2023-09-02 DIAGNOSIS — R7309 Other abnormal glucose: Secondary | ICD-10-CM

## 2023-09-02 MED ORDER — ACETAMINOPHEN 325 MG PO TABS
650.0000 mg | ORAL_TABLET | ORAL | 0 refills | Status: DC | PRN
Start: 2023-09-02 — End: 2023-09-02

## 2023-09-02 MED ORDER — IBUPROFEN 600 MG PO TABS: 600.0000 mg | ORAL_TABLET | Freq: Three times a day (TID) | ORAL | 3 refills | Status: AC | PRN

## 2023-09-02 MED ORDER — ACETAMINOPHEN 325 MG PO TABS: 650.0000 mg | ORAL_TABLET | Freq: Four times a day (QID) | ORAL | 3 refills | Status: DC | PRN

## 2023-09-02 MED ORDER — IBUPROFEN 600 MG PO TABS
600.0000 mg | ORAL_TABLET | Freq: Four times a day (QID) | ORAL | 0 refills | Status: DC
Start: 2023-09-02 — End: 2023-09-02

## 2023-09-02 NOTE — Progress Notes (Unsigned)
Assessment & Plan:  Regina Shea was seen today for back pain, lipoma and obesity.  Diagnoses and all orders for this visit:  Multiple lipomas -     Ambulatory referral to Dermatology  Gain of weight -     Thyroid Panel With TSH  Neck pain -     Discontinue: ibuprofen (ADVIL) 600 MG tablet; Take 1 tablet (600 mg total) by mouth every 6 (six) hours. -     Discontinue: acetaminophen (TYLENOL) 325 MG tablet; Take 2 tablets (650 mg total) by mouth every 4 (four) hours as needed (for pain scale < 4). -     DG Cervical Spine Complete; Future -     acetaminophen (TYLENOL) 325 MG tablet; Take 2 tablets (650 mg total) by mouth every 6 (six) hours as needed (for pain scale < 4). -     ibuprofen (ADVIL) 600 MG tablet; Take 1 tablet (600 mg total) by mouth every 8 (eight) hours as needed.  Upper back pain -     Discontinue: ibuprofen (ADVIL) 600 MG tablet; Take 1 tablet (600 mg total) by mouth every 6 (six) hours. -     Discontinue: acetaminophen (TYLENOL) 325 MG tablet; Take 2 tablets (650 mg total) by mouth every 4 (four) hours as needed (for pain scale < 4). -     DG Thoracic Spine 2 View; Future -     acetaminophen (TYLENOL) 325 MG tablet; Take 2 tablets (650 mg total) by mouth every 6 (six) hours as needed (for pain scale < 4). -     ibuprofen (ADVIL) 600 MG tablet; Take 1 tablet (600 mg total) by mouth every 8 (eight) hours as needed.  History of gestational diabetes -     Hemoglobin A1c  Elevated glucose -     Hemoglobin A1c -     CMP14+EGFR  Leukocytosis, unspecified type -     CBC with Differential    Patient has been counseled on age-appropriate routine health concerns for screening and prevention. These are reviewed and up-to-date. Referrals have been placed accordingly. Immunizations are up-to-date or declined.    Subjective:   Chief Complaint  Patient presents with   Back Pain    Upper back right below neck.   Lipoma    Legs and arms    Obesity    Regina  Shea 43 y.o. female presents to office today for lipomas     She is also concerned that she is not losing weight.  She is eating the same.   She also endorses pain in the back of the neck and shoulders. She does remember doing something that may have caused her pain in the back of her back.    She had her son stand on her back who is very heavy. She felt he may have stepped up to her neck and  Feles pain when right arm is strechte      ROS  Past Medical History:  Diagnosis Date   Aneurysm (HCC) 2012   never confirmed by tests   Asthma    h/o no inhalers   Chest pain 01/20/2019   Fistula-in-ano    Gestational diabetes 05/23/2022   Headache    migraines   Vision loss 2012   states lost vision in [place] but it returned, they told her may be aneurysm, but never had follow up    Past Surgical History:  Procedure Laterality Date   NO PAST SURGERIES     RECTAL EXAM UNDER ANESTHESIA  N/A 01/23/2019   Procedure: RECTAL EXAM UNDER ANESTHESIA, with FISTULOTOMY and  FIBRIN GLUE;  Surgeon: Duanne Guess, MD;  Location: [facility] ORS;  Service: General;  Laterality: N/A;    Family History  Problem Relation Age of Onset   Hypertension Mother    Asthma Mother    Diabetes Father    Hypertension Father    Hypertension Brother    Cancer Maternal Uncle    Heart disease Neg Hx    Stroke Neg Hx     Social History Reviewed with no changes to be made today.   Outpatient Medications Prior to Visit  Medication Sig Dispense Refill   acetaminophen (TYLENOL) 325 MG tablet Take 2 tablets (650 mg total) by mouth every 4 (four) hours as needed (for pain scale < 4). (Patient not taking: Reported on 09/02/2023) 30 tablet 0   benzocaine-Menthol (DERMOPLAST) 20-0.5 % AERO Apply 1 Application topically as needed for irritation (perineal discomfort). (Patient not taking: Reported on 09/02/2023) 78 g 0   ibuprofen (ADVIL) 600 MG tablet Take 1 tablet (600 mg total) by mouth every 6 (six)  hours. (Patient not taking: Reported on 09/02/2023) 30 tablet 0   sertraline (ZOLOFT) 25 MG tablet Take 1 tablet (25 mg total) by mouth daily. (Patient not taking: Reported on 09/02/2023) 30 tablet 3   No facility-administered medications prior to visit.    No Known Allergies     Objective:    BP 113/76 (BP Location: Left Arm, Patient Position: Sitting, Cuff Size: Large)   Pulse 81   Resp 20   Ht 4\' 11"  (1.499 m)   Wt 200 lb 1.6 oz (90.8 kg)   LMP 08/30/2023 (Exact Date)   SpO2 98%   Breastfeeding No   BMI 40.42 kg/m  Wt Readings from Last 3 Encounters:  09/02/23 200 lb 1.6 oz (90.8 kg)  11/07/22 186 lb (84.4 kg)  09/27/22 203 lb (92.1 kg)    Physical Exam       Patient has been counseled extensively about nutrition and exercise as well as the importance of adherence with medications and regular follow-up. The patient was given clear instructions to go to ER or return to medical center if symptoms don't improve, worsen or new problems develop. The patient verbalized understanding.   Follow-up: No follow-ups on file.   [name], FNP-BC [facility] and [facility] [place], [place] [phone]   09/02/2023, 10:18 AM

## 2023-09-03 ENCOUNTER — Encounter: Payer: Self-pay | Admitting: Nurse Practitioner

## 2023-09-03 LAB — THYROID PANEL WITH TSH
Free Thyroxine Index: 2.3 (ref 1.2–4.9)
T3 Uptake Ratio: 27 % (ref 24–39)
T4, Total: 8.7 ug/dL (ref 4.5–12.0)
TSH: 3.05 u[IU]/mL (ref 0.450–4.500)

## 2023-09-03 LAB — CMP14+EGFR
ALT: 29 [IU]/L (ref 0–32)
AST: 17 [IU]/L (ref 0–40)
Albumin: 4.4 g/dL (ref 3.9–4.9)
Alkaline Phosphatase: 113 [IU]/L (ref 44–121)
BUN/Creatinine Ratio: 21 (ref 9–23)
BUN: 14 mg/dL (ref 6–24)
Bilirubin Total: 0.2 mg/dL (ref 0.0–1.2)
CO2: 23 mmol/L (ref 20–29)
Calcium: 9.4 mg/dL (ref 8.7–10.2)
Chloride: 104 mmol/L (ref 96–106)
Creatinine, Ser: 0.66 mg/dL (ref 0.57–1.00)
Globulin, Total: 3.1 g/dL (ref 1.5–4.5)
Glucose: 108 mg/dL — ABNORMAL HIGH (ref 70–99)
Potassium: 4.1 mmol/L (ref 3.5–5.2)
Sodium: 138 mmol/L (ref 134–144)
Total Protein: 7.5 g/dL (ref 6.0–8.5)
eGFR: 112 mL/min/{1.73_m2} (ref >59)

## 2023-09-03 LAB — CBC WITH DIFFERENTIAL/PLATELET
Basophils Absolute: 0.1 10*3/uL (ref 0.0–0.2)
Basos: 1 %
EOS (ABSOLUTE): 0.1 10*3/uL (ref 0.0–0.4)
Eos: 1 %
Hematocrit: 41 % (ref 34.0–46.6)
Hemoglobin: 13.7 g/dL (ref 11.1–15.9)
Immature Grans (Abs): 0 10*3/uL (ref 0.0–0.1)
Immature Granulocytes: 0 %
Lymphocytes Absolute: 2.9 10*3/uL (ref 0.7–3.1)
Lymphs: 38 %
MCH: 28.4 pg (ref 26.6–33.0)
MCHC: 33.4 g/dL (ref 31.5–35.7)
MCV: 85 fL (ref 79–97)
Monocytes Absolute: 0.6 10*3/uL (ref 0.1–0.9)
Monocytes: 7 %
Neutrophils Absolute: 4 10*3/uL (ref 1.4–7.0)
Neutrophils: 53 %
Platelets: 340 10*3/uL (ref 150–450)
RBC: 4.83 x10E6/uL (ref 3.77–5.28)
RDW: 12.8 % (ref 11.7–15.4)
WBC: 7.7 10*3/uL (ref 3.4–10.8)

## 2023-09-03 LAB — HEMOGLOBIN A1C
Est. average glucose Bld gHb Est-mCnc: 123 mg/dL
Hgb A1c MFr Bld: 5.9 % — ABNORMAL HIGH (ref 4.8–5.6)

## 2023-09-23 ENCOUNTER — Ambulatory Visit: Payer: Self-pay | Admitting: Nurse Practitioner

## 2023-09-23 NOTE — Telephone Encounter (Signed)
  Chief Complaint: rectal pain - bump near anus Symptoms: pain, bump Frequency: 3 days ago Pertinent Negatives: Patient denies fever Disposition: [] ED /[] Urgent Care (no appt availability in office) / [x] Appointment(In office/virtual)/ []  [facility]/ [] Home Care/ [] Refused Recommended Disposition /[] [place] [facility]/ []  Follow-up with PCP Additional Notes: pt states that she has had anal fissures in the past and feels this might be another.  Pt states that the area is very painful and it is difficult to sit down. Appt scheduled for tomorrow morning.     Copied from CRM [id number]. Topic: Appointments - Appointment Scheduling >> Sep 23, 2023 12:01 PM Gery Pray wrote: Patient/patient representative is calling to schedule an appointment. Refer to attachments for appointment information. Patient has an anal bum with a slight fever to the touch. Patient states that pain is a 10 and she is losing sleep due to the severe amount of pain. In addition, patient states that when she had it removed the first time the provider stated that it will come back. Reason for Disposition  MODERATE-SEVERE rectal pain (i.e., interferes with school, work, or sleep)  Answer Assessment - Initial Assessment Questions 1. SYMPTOM:  "What's the main symptom you're concerned about?" (e.g., pain, itching, swelling, rash)     Pain left side by anus 2. ONSET: "When did the s/s  start?"     3 days ago 3. RECTAL PAIN: "Do you have any pain around your rectum?" "How bad is the pain?"  (Scale 0-10; or mild, moderate, severe)   - NONE (0): no pain   - MILD (1-3): doesn't interfere with normal activities    - MODERATE (4-7): interferes with normal activities or awakens from sleep, limping    - SEVERE (8-10): excruciating pain, unable to have a bowel movement      10/10 4. RECTAL ITCHING: "Do you have any itching in this area?" "How bad is the itching?"  (Scale 0-10; or mild, moderate, severe)   - NONE: no itching    - MILD: doesn't interfere with normal activities    - MODERATE-SEVERE: interferes with normal activities or awakens from sleep     no 5. CONSTIPATION: "Do you have constipation?" If Yes, ask: "How bad is it?"     na 6. CAUSE: "What do you think is causing the anus symptoms?"     Anal fistula 7. OTHER SYMPTOMS: "Do you have any other symptoms?"  (e.g., abdomen pain, fever, rectal bleeding, vomiting)     pain  Protocols used: Rectal Symptoms-A-AH

## 2023-09-24 ENCOUNTER — Ambulatory Visit: Payer: Self-pay | Attending: Nurse Practitioner | Admitting: Nurse Practitioner

## 2023-09-24 ENCOUNTER — Other Ambulatory Visit: Payer: Self-pay

## 2023-09-24 ENCOUNTER — Encounter: Payer: Self-pay | Admitting: Nurse Practitioner

## 2023-09-24 VITALS — BP 116/78 | HR 86 | Resp 19 | Ht 59.0 in | Wt 202.4 lb

## 2023-09-24 DIAGNOSIS — L0231 Cutaneous abscess of buttock: Secondary | ICD-10-CM

## 2023-09-24 DIAGNOSIS — L03317 Cellulitis of buttock: Secondary | ICD-10-CM

## 2023-09-24 MED ORDER — ACETAMINOPHEN-CODEINE 300-30 MG PO TABS
1.0000 | ORAL_TABLET | Freq: Three times a day (TID) | ORAL | 0 refills | Status: DC | PRN
  Filled 2023-09-24: qty 30, 10d supply, fill #0

## 2023-09-24 MED ORDER — SULFAMETHOXAZOLE-TRIMETHOPRIM 800-160 MG PO TABS
1.0000 | ORAL_TABLET | Freq: Two times a day (BID) | ORAL | 0 refills | Status: AC
  Filled 2023-09-24: qty 14, 7d supply, fill #0

## 2023-09-24 NOTE — Progress Notes (Signed)
 I have seen and examined this patient with the advanced practice provider STUDENT and agree with the note below

## 2023-09-24 NOTE — Progress Notes (Signed)
 .  Assessment & Plan:  [name] was seen today for rectal pain.  Diagnoses and all orders for this visit:  Abscess and cellulitis of gluteal region -     CBC -     sulfamethoxazole-trimethoprim (BACTRIM DS) 800-160 MG tablet; Take 1 tablet by mouth 2 (two) times daily for 7 days. -     Ambulatory referral to General Surgery -     acetaminophen-codeine (TYLENOL #3) 300-30 MG tablet; Take 1 tablet by mouth every 8 (eight) hours as needed for moderate pain (pain score 4-6). Education provided on AVS for prediabetes and diet Reduce carbohydrate intake, sugary foods, soda, and highly processed foods.  Eat more vegetables, and lean protein,  Education provided on AVS on abscess Use an antibacterial soap Warm compresses to area to reduce pain and swelling Complete full course of antibiotics, report any side effects such hives, swelling, shortness of breath     Patient has been counseled on age-appropriate routine health concerns for screening and prevention. These are reviewed and up-to-date. Referrals have been placed accordingly. Immunizations are up-to-date or declined.    Subjective:   Chief Complaint  Patient presents with   Rectal Pain    Regina Shea 43 y.o. female presents to office today for abscess   Abscess: Patient presents for evaluation of a cutaneous abscess. Lesion is located in the left medial buttock near the perirectal area. Onset was 3 day ago. Symptoms have gradually worsened with pain rated 10 on pain scale. Abscess has associated symptoms of pain, erythema, and swelling. Area is negative for pus or bloody drainage. Patient does have previous history of cutaneous abscesses and was seen by General Surgery in the past requiring intra-op fistulotomy.  A1C is 5.9, she is prediabetic.      Review of Systems  Constitutional:  Negative for chills and fever.  HENT: Negative.    Eyes: Negative.   Respiratory: Negative.    Cardiovascular: Negative.    Gastrointestinal: Negative.   Genitourinary: Negative.   Musculoskeletal: Negative.   Skin:        redness, swelling and pain at left buttock  Neurological: Negative.   Endo/Heme/Allergies: Negative.   Psychiatric/Behavioral: Negative.      Past Medical History:  Diagnosis Date   Aneurysm (HCC) 2012   never confirmed by tests   Asthma    h/o no inhalers   Chest pain 01/20/2019   Fistula-in-ano    Gestational diabetes 05/23/2022   Headache    migraines   Vision loss 2012   states lost vision in [place] but it returned, they told her may be aneurysm, but never had follow up    Past Surgical History:  Procedure Laterality Date   NO PAST SURGERIES     RECTAL EXAM UNDER ANESTHESIA N/A 01/23/2019   Procedure: RECTAL EXAM UNDER ANESTHESIA, with FISTULOTOMY and  FIBRIN GLUE;  Surgeon: Duanne Guess, MD;  Location: [facility] ORS;  Service: General;  Laterality: N/A;    Family History  Problem Relation Age of Onset   Hypertension Mother    Asthma Mother    Diabetes Father    Hypertension Father    Hypertension Brother    Cancer Maternal Uncle    Heart disease Neg Hx    Stroke Neg Hx     Social History Reviewed with no changes to be made today.   Outpatient Medications Prior to Visit  Medication Sig Dispense Refill   ibuprofen (ADVIL) 600 MG tablet Take 1 tablet (600 mg total) by  mouth every 8 (eight) hours as needed. 60 tablet 3   acetaminophen (TYLENOL) 325 MG tablet Take 2 tablets (650 mg total) by mouth every 6 (six) hours as needed (for pain scale < 4). 60 tablet 3   No facility-administered medications prior to visit.    No Known Allergies     Objective:    BP 116/78 (BP Location: Left Arm, Patient Position: Sitting, Cuff Size: Normal)   Pulse 86   Resp 19   Ht 4\' 11"  (1.499 m)   Wt 202 lb 6.4 oz (91.8 kg)   LMP 08/30/2023 (Exact Date)   SpO2 99%   Breastfeeding No   BMI 40.88 kg/m  Wt Readings from Last 3 Encounters:  09/24/23 202 lb 6.4 oz (91.8  kg)  09/02/23 200 lb 1.6 oz (90.8 kg)  11/07/22 186 lb (84.4 kg)    Physical Exam Constitutional:      General: She is in acute distress.  HENT:     Head: Normocephalic.     Right Ear: External ear normal.     Left Ear: External ear normal.     Nose: Nose normal.  Cardiovascular:     Rate and Rhythm: Normal rate and regular rhythm.  Pulmonary:     Effort: Pulmonary effort is normal.     Breath sounds: Normal breath sounds.  Genitourinary:    Comments: Abscess left buttock Musculoskeletal:        General: Normal range of motion.     Cervical back: Normal range of motion.  Skin:    General: Skin is warm and dry.     Findings: Abscess and erythema present.          Comments: SEE PHOTO  Neurological:     Mental Status: She is alert and oriented to person, place, and time.  Psychiatric:        Attention and Perception: Attention normal.        Mood and Affect: Mood normal.        Behavior: Behavior normal. Behavior is cooperative.        Cognition and Memory: Cognition normal.        Judgment: Judgment normal.         Patient has been counseled extensively about nutrition and exercise as well as the importance of adherence with medications and regular follow-up. The patient was given clear instructions to go to ER or return to medical center if symptoms don't improve, worsen or new problems develop. The patient verbalized understanding.   Follow-up: Return if symptoms worsen or fail to improve.   [name], BSN, RN - Student FNP [facility] and [facility] [place], [place] [phone]   09/24/2023, 12:12 PM

## 2023-09-25 LAB — CBC
Hematocrit: 42.4 % (ref 34.0–46.6)
Hemoglobin: 13.4 g/dL (ref 11.1–15.9)
MCH: 27.5 pg (ref 26.6–33.0)
MCHC: 31.6 g/dL (ref 31.5–35.7)
MCV: 87 fL (ref 79–97)
Platelets: 296 10*3/uL (ref 150–450)
RBC: 4.88 x10E6/uL (ref 3.77–5.28)
RDW: 13.1 % (ref 11.7–15.4)
WBC: 12 10*3/uL — ABNORMAL HIGH (ref 3.4–10.8)

## 2023-09-26 ENCOUNTER — Encounter: Payer: Self-pay | Admitting: General Surgery

## 2023-09-26 ENCOUNTER — Ambulatory Visit: Payer: Self-pay | Admitting: General Surgery

## 2023-09-26 ENCOUNTER — Ambulatory Visit (INDEPENDENT_AMBULATORY_CARE_PROVIDER_SITE_OTHER): Payer: Self-pay | Admitting: General Surgery

## 2023-09-26 ENCOUNTER — Telehealth: Payer: Self-pay | Admitting: General Surgery

## 2023-09-26 VITALS — BP 103/70 | HR 89 | Temp 100.3°F | Ht 59.0 in | Wt 198.0 lb

## 2023-09-26 DIAGNOSIS — K611 Rectal abscess: Secondary | ICD-10-CM

## 2023-09-26 MED ORDER — CEFAZOLIN SODIUM-DEXTROSE 2-4 GM/100ML-% IV SOLN
2.0000 g | INTRAVENOUS | Status: AC
  Administered 2023-09-27: 2 g via INTRAVENOUS

## 2023-09-26 MED ORDER — CHLORHEXIDINE GLUCONATE CLOTH 2 % EX PADS: 6.0000 | MEDICATED_PAD | Freq: Once | CUTANEOUS | Status: DC

## 2023-09-26 NOTE — Patient Instructions (Addendum)
 We are scheduling you for surgery at [facility] tomorrow. Do not eat or drink anything after midnight tonight and nothing in the morning.  You will need to arrive at the [facility] Mall entrance of the hospital and check in at the OR desk on the 2nd floor. You will need to arrive there at 6:45 am.    Regina Shea programaremos Regina Shea en [facility]. No coma ni beba nada despus de la medianoche de esta noche y nada por la maana.  Deber llegar a la entrada del Medical Mall del hospital y English as a second language teacher en el mostrador de quirfano en el segundo piso. Deber llegar all a las 6:45 am.

## 2023-09-26 NOTE — Progress Notes (Signed)
 Patient ID: Regina Shea, female   DOB: Jun 24, 1981, 43 y.o.   MRN: [id number] CC: Left Gluteal Pain History of Present Illness Regina Shea is a 43 y.o. female with past medical history as listed below who is seen in consultation for left gluteal pain and swelling.  The patient reports that 3 to 4 days ago she noticed some increased swelling and pain around her anus.  She says that it started to feel like when she had an abscess in 2020.  She says that there has been swelling and it is extremely painful to touch.  She went to her primary care doctor for evaluation.  At that time she was diagnosed with cellulitis and given antibiotics.  She says however, that the pain has persisted despite antibiotics and that she has had increased swelling.  She denies any fevers or chills and denies any nausea or vomiting.  She is having normal bowel movements and denies any purulent drainage or blood in her bowel movements.  Of note, the patient had an exam under anesthesia with incision and drainage and partial fistulotomy was from the in 2020.  She says since then she has not had any persistent drainage or any problems.  She reports normal soft bowel movements without having to strain.  She has had 6 children and she says in her first 2 pregnancies she did sustain lacerations that required repair.  She has never had a colonoscopy denies any family history of inflammatory bowel disease or colon cancer.  Past Medical History Past Medical History:  Diagnosis Date   Aneurysm (HCC) 2012   never confirmed by tests   Asthma    h/o no inhalers   Chest pain 01/20/2019   Fistula-in-ano    Gestational diabetes 05/23/2022   Headache    migraines   Vision loss 2012   states lost vision in [place] but it returned, they told her may be aneurysm, but never had follow up       Past Surgical History:  Procedure Laterality Date   RECTAL EXAM UNDER ANESTHESIA N/A 01/23/2019   Procedure: RECTAL EXAM  UNDER ANESTHESIA, with FISTULOTOMY and  FIBRIN GLUE;  Surgeon: Duanne Guess, MD;  Location: [facility] ORS;  Service: General;  Laterality: N/A;    No Known Allergies  Current Outpatient Medications  Medication Sig Dispense Refill   acetaminophen-codeine (TYLENOL #3) 300-30 MG tablet Take 1 tablet by mouth every 8 (eight) hours as needed for moderate pain (pain score 4-6). 30 tablet 0   ibuprofen (ADVIL) 600 MG tablet Take 1 tablet (600 mg total) by mouth every 8 (eight) hours as needed. 60 tablet 3   sulfamethoxazole-trimethoprim (BACTRIM DS) 800-160 MG tablet Take 1 tablet by mouth 2 (two) times daily for 7 days. 14 tablet 0   No current facility-administered medications for this visit.    Family History Family History  Problem Relation Age of Onset   Hypertension Mother    Asthma Mother    Diabetes Father    Hypertension Father    Hypertension Brother    Cancer Maternal Uncle    Heart disease Neg Hx    Stroke Neg Hx        Social History Social History   Tobacco Use   Smoking status: Never    Passive exposure: Never   Smokeless tobacco: Never  Vaping Use   Vaping status: Never Used  Substance Use Topics   Alcohol use: No   Drug use: No  ROS Full ROS of systems performed and is otherwise negative there than what is stated in the HPI  Physical Exam Blood pressure 103/70, pulse 89, temperature 100.3 F (37.9 C), height 4\' 11"  (1.499 m), weight 198 lb (89.8 kg), last menstrual period 08/30/2023, SpO2 99%, not currently breastfeeding.  Alert and oriented x 3, clear to auscultation bilaterally, regular rate and rhythm, abdomen soft, nontender and obese, rectal exam performed in the presence of a chaperone.  There is induration and swelling in the left buttock extending into the left anoderm slightly anterior.  There is an area of fluctuance just distal to the anal mucosa that is very painful to palpation.  I was unable to express any purulence from the  wound.  Data Reviewed I reviewed her last operative note where she had a partial fistulotomy with application of fibrin glue.  I also reviewed her last labs and these were significant for a leukocytosis of 12,000 and a hemoglobin A1c of 5.9  I have personally reviewed the patient's imaging and medical records.    Assessment/Plan    The patient is a 43 year old with 4-day history of anal pain and swelling with fluctuance.  On exam this is consistent with perirectal abscess.  I discussed with the patient that she would need formal incision and drainage and given the degree of swelling I do not think she would tolerate this in the office.  I also discussed with her that she may have a fistula.  We will perform an exam under anesthesia with I&D of perirectal abscess and possible seton placement.  Discussed the risk, benefits alternatives of the procedure including risk of further infection, bleeding, fistula formation and pelvic sepsis.  She understands these risks and wishes to proceed.  She ate today so we will plan to take her to the operating room in the morning.  She is currently on antibiotics prescribed her primary care doctor which she should continue.  I discussed that overnight tonight she can use warm compresses and sitz bath's for comfort     Kandis Cocking 09/26/2023, 1:17 PM

## 2023-09-26 NOTE — Telephone Encounter (Signed)
 Patient has been advised of Pre-Admission date/time, and Surgery date at [facility].  Surgery Date: 09/27/23 Preadmission Testing Date: 09/27/23 (patient to arrive 2 hours early)  Patient has been made aware to arrive at 6:45 am at [facility] 2nd floor for surgery.  Patient also reminded to be NPO after midnight.

## 2023-09-27 ENCOUNTER — Ambulatory Visit: Payer: Self-pay | Admitting: Anesthesiology

## 2023-09-27 ENCOUNTER — Other Ambulatory Visit: Payer: Self-pay

## 2023-09-27 ENCOUNTER — Encounter: Payer: Self-pay | Admitting: General Surgery

## 2023-09-27 ENCOUNTER — Ambulatory Visit
Admission: RE | Admit: 2023-09-27 | Discharge: 2023-09-27 | Disposition: A | Discharge status: Home / Self Care | Payer: Self-pay | Source: Ambulatory Visit | Attending: General Surgery | Admitting: General Surgery

## 2023-09-27 ENCOUNTER — Encounter
Admission: RE | Disposition: A | Discharge status: Home / Self Care | Payer: Self-pay | Source: Ambulatory Visit | Attending: General Surgery

## 2023-09-27 DIAGNOSIS — Z01812 Encounter for preprocedural laboratory examination: Secondary | ICD-10-CM

## 2023-09-27 DIAGNOSIS — K611 Rectal abscess: Secondary | ICD-10-CM

## 2023-09-27 DIAGNOSIS — E119 Type 2 diabetes mellitus without complications: Secondary | ICD-10-CM | POA: Insufficient documentation

## 2023-09-27 HISTORY — PX: INCISION AND DRAINAGE ABSCESS: SHX5864

## 2023-09-27 HISTORY — PX: PLACEMENT OF SETON: SHX6029

## 2023-09-27 HISTORY — DX: Rectal abscess: K61.1

## 2023-09-27 LAB — POCT PREGNANCY, URINE: Preg Test, Ur: NEGATIVE

## 2023-09-27 SURGERY — EXAM UNDER ANESTHESIA
Anesthesia: General | Site: Rectum

## 2023-09-27 MED ORDER — PROPOFOL 10 MG/ML IV BOLUS
INTRAVENOUS | Status: DC | PRN
  Administered 2023-09-27: 160 mg via INTRAVENOUS
  Administered 2023-09-27: 40 mg via INTRAVENOUS

## 2023-09-27 MED ORDER — KETOROLAC TROMETHAMINE 30 MG/ML IJ SOLN
INTRAMUSCULAR | Status: AC
  Filled 2023-09-27: qty 1

## 2023-09-27 MED ORDER — FENTANYL CITRATE (PF) 100 MCG/2ML IJ SOLN
INTRAMUSCULAR | Status: DC | PRN
  Administered 2023-09-27 (×4): 25 ug via INTRAVENOUS

## 2023-09-27 MED ORDER — 0.9 % SODIUM CHLORIDE (POUR BTL) OPTIME
TOPICAL | Status: DC | PRN
  Administered 2023-09-27: 500 mL

## 2023-09-27 MED ORDER — ACETAMINOPHEN 10 MG/ML IV SOLN
INTRAVENOUS | Status: AC
  Filled 2023-09-27: qty 100

## 2023-09-27 MED ORDER — KETOROLAC TROMETHAMINE 30 MG/ML IJ SOLN
INTRAMUSCULAR | Status: DC | PRN
Start: 2023-09-27 — End: 2023-09-27
  Administered 2023-09-27: 30 mg via INTRAVENOUS

## 2023-09-27 MED ORDER — LIDOCAINE HCL (CARDIAC) PF 100 MG/5ML IV SOSY
PREFILLED_SYRINGE | INTRAVENOUS | Status: DC | PRN
  Administered 2023-09-27: 100 mg via INTRAVENOUS

## 2023-09-27 MED ORDER — CHLORHEXIDINE GLUCONATE 0.12 % MT SOLN
OROMUCOSAL | Status: AC
  Filled 2023-09-27: qty 15

## 2023-09-27 MED ORDER — ONDANSETRON HCL 4 MG/2ML IJ SOLN
INTRAMUSCULAR | Status: DC | PRN
  Administered 2023-09-27: 4 mg via INTRAVENOUS

## 2023-09-27 MED ORDER — FENTANYL CITRATE (PF) 100 MCG/2ML IJ SOLN: 25.0000 ug | INTRAMUSCULAR | Status: DC | PRN

## 2023-09-27 MED ORDER — MIDAZOLAM HCL 2 MG/2ML IJ SOLN
INTRAMUSCULAR | Status: DC | PRN
  Administered 2023-09-27: 2 mg via INTRAVENOUS

## 2023-09-27 MED ORDER — OXYCODONE HCL 5 MG/5ML PO SOLN: 5.0000 mg | Freq: Once | ORAL | Status: DC | PRN

## 2023-09-27 MED ORDER — FENTANYL CITRATE (PF) 100 MCG/2ML IJ SOLN
INTRAMUSCULAR | Status: AC
  Filled 2023-09-27: qty 2

## 2023-09-27 MED ORDER — GLYCOPYRROLATE 0.2 MG/ML IJ SOLN
INTRAMUSCULAR | Status: AC
  Filled 2023-09-27: qty 1

## 2023-09-27 MED ORDER — HYDROMORPHONE HCL 1 MG/ML IJ SOLN
INTRAMUSCULAR | Status: AC
  Filled 2023-09-27: qty 1

## 2023-09-27 MED ORDER — MIDAZOLAM HCL 2 MG/2ML IJ SOLN
INTRAMUSCULAR | Status: AC
  Filled 2023-09-27: qty 2

## 2023-09-27 MED ORDER — HYDROGEN PEROXIDE 3 % EX SOLN
CUTANEOUS | Status: DC | PRN
  Administered 2023-09-27: 1

## 2023-09-27 MED ORDER — OXYCODONE HCL 5 MG PO TABS
5.0000 mg | ORAL_TABLET | Freq: Four times a day (QID) | ORAL | 0 refills | Status: DC | PRN
  Filled 2023-09-27: qty 20, 5d supply, fill #0

## 2023-09-27 MED ORDER — DEXAMETHASONE SODIUM PHOSPHATE 10 MG/ML IJ SOLN
INTRAMUSCULAR | Status: AC
  Filled 2023-09-27: qty 1

## 2023-09-27 MED ORDER — LACTATED RINGERS IV SOLN: INTRAVENOUS | Status: DC

## 2023-09-27 MED ORDER — ACETAMINOPHEN 10 MG/ML IV SOLN
INTRAVENOUS | Status: DC | PRN
  Administered 2023-09-27: 1000 mg via INTRAVENOUS

## 2023-09-27 MED ORDER — CEFAZOLIN SODIUM-DEXTROSE 2-4 GM/100ML-% IV SOLN
INTRAVENOUS | Status: AC
  Filled 2023-09-27: qty 100

## 2023-09-27 MED ORDER — PROPOFOL 10 MG/ML IV BOLUS
INTRAVENOUS | Status: AC
Start: 2023-09-27 — End: ?
  Filled 2023-09-27: qty 40

## 2023-09-27 MED ORDER — DEXAMETHASONE SODIUM PHOSPHATE 10 MG/ML IJ SOLN
INTRAMUSCULAR | Status: DC | PRN
  Administered 2023-09-27: 10 mg via INTRAVENOUS

## 2023-09-27 MED ORDER — OXYCODONE HCL 5 MG PO TABS: 5.0000 mg | ORAL_TABLET | Freq: Once | ORAL | Status: DC | PRN

## 2023-09-27 MED ORDER — ORAL CARE MOUTH RINSE: 15.0000 mL | Freq: Once | OROMUCOSAL | Status: AC

## 2023-09-27 MED ORDER — LIDOCAINE HCL (PF) 2 % IJ SOLN
INTRAMUSCULAR | Status: AC
Start: 2023-09-27 — End: ?
  Filled 2023-09-27: qty 5

## 2023-09-27 MED ORDER — HYDROMORPHONE HCL 1 MG/ML IJ SOLN
INTRAMUSCULAR | Status: DC | PRN
  Administered 2023-09-27 (×4): .25 mg via INTRAVENOUS

## 2023-09-27 MED ORDER — CHLORHEXIDINE GLUCONATE 0.12 % MT SOLN
15.0000 mL | Freq: Once | OROMUCOSAL | Status: AC
  Administered 2023-09-27: 15 mL via OROMUCOSAL

## 2023-09-27 MED ORDER — ONDANSETRON HCL 4 MG/2ML IJ SOLN
INTRAMUSCULAR | Status: AC
  Filled 2023-09-27: qty 2

## 2023-09-27 SURGICAL SUPPLY — 31 items
BLADE CLIPPER SURG (BLADE) IMPLANT
BNDG GAUZE DERMACEA FLUFF 4 (GAUZE/BANDAGES/DRESSINGS) IMPLANT
DRAPE LAPAROTOMY 100X77 ABD (DRAPES) ×1 IMPLANT
DRAPE LEGGINS SURG 28X43 STRL (DRAPES) ×1 IMPLANT
DRAPE UNDER BUTTOCK W/FLU (DRAPES) ×1 IMPLANT
ELECT REM PT RETURN 9FT ADLT (ELECTROSURGICAL) ×1 IMPLANT
ELECTRODE REM PT RTRN 9FT ADLT (ELECTROSURGICAL) ×1 IMPLANT
GAUZE 4X4 16PLY ~~LOC~~+RFID DBL (SPONGE) IMPLANT
GLOVE BIOGEL PI IND STRL 7.5 (GLOVE) ×1 IMPLANT
GLOVE SURG SYN 7.0 (GLOVE) ×1 IMPLANT
GLOVE SURG SYN 7.0 PF PI (GLOVE) ×1 IMPLANT
GOWN STRL REUS W/ TWL LRG LVL3 (GOWN DISPOSABLE) ×2 IMPLANT
HYDROGEN PEROXIDE 16OZ (MISCELLANEOUS) IMPLANT
IV CATH ANGIO 14GX3.25 ORG (MISCELLANEOUS) IMPLANT
KIT TURNOVER CYSTO (KITS) IMPLANT
LOOP VESSEL MAXI 1X406 RED (MISCELLANEOUS) IMPLANT
MANIFOLD NEPTUNE II (INSTRUMENTS) ×1 IMPLANT
NDL HYPO 22X1.5 SAFETY MO (MISCELLANEOUS) IMPLANT
NEEDLE HYPO 22X1.5 SAFETY MO (MISCELLANEOUS) IMPLANT
NS IRRIG 1000ML POUR BTL (IV SOLUTION) ×1 IMPLANT
PACK BASIN MINOR ARMC (MISCELLANEOUS) ×1 IMPLANT
PAD PREP OB/GYN DISP 24X41 (PERSONAL CARE ITEMS) ×1 IMPLANT
SOL PREP POV-IOD 4OZ 10% (MISCELLANEOUS) IMPLANT
SOL PREP PVP 2OZ (MISCELLANEOUS) ×1 IMPLANT
SOLUTION PREP PVP 2OZ (MISCELLANEOUS) ×1 IMPLANT
SPONGE T-LAP 18X18 ~~LOC~~+RFID (SPONGE) ×1 IMPLANT
SURGILUBE 2OZ TUBE FLIPTOP (MISCELLANEOUS) ×1 IMPLANT
SUT SILK 0 30XBRD TIE 6 (SUTURE) IMPLANT
SWAB CULTURE AMIES ANAERIB BLU (MISCELLANEOUS) IMPLANT
TRAP FLUID SMOKE EVACUATOR (MISCELLANEOUS) ×1 IMPLANT
WATER STERILE IRR 500ML POUR (IV SOLUTION) ×1 IMPLANT

## 2023-09-27 NOTE — Anesthesia Preprocedure Evaluation (Addendum)
 Anesthesia Evaluation  Patient identified by MRN, date of birth, ID band Patient awake    Reviewed: Allergy & Precautions, NPO status , Patient's Chart, lab work & pertinent test results  History of Anesthesia Complications Negative for: history of anesthetic complications  Airway Mallampati: III  TM Distance: <3 FB Neck ROM: full    Dental  (+) Chipped   Pulmonary neg shortness of breath, asthma    Pulmonary exam normal        Cardiovascular Exercise Tolerance: Good negative cardio ROS Normal cardiovascular exam     Neuro/Psych  Headaches  negative psych ROS   GI/Hepatic negative GI ROS, Neg liver ROS,neg GERD  ,,  Endo/Other  diabetes, Type 2    Renal/GU      Musculoskeletal   Abdominal   Peds  Hematology negative hematology ROS (+)   Anesthesia Other Findings Past Medical History: 2012: Aneurysm (HCC)     Comment:  never confirmed by tests No date: Asthma     Comment:  h/o no inhalers 01/20/2019: Chest pain No date: Fistula-in-ano 05/23/2022: Gestational diabetes No date: Headache     Comment:  migraines 2012: Vision loss     Comment:  states lost vision in [place] but it returned, they               told her may be aneurysm, but never had follow up  Past Surgical History: 01/23/2019: RECTAL EXAM UNDER ANESTHESIA; N/A     Comment:  Procedure: RECTAL EXAM UNDER ANESTHESIA, with               FISTULOTOMY and  FIBRIN GLUE;  Surgeon: Duanne Guess,              MD;  Location: [facility] ORS;  Service: General;  Laterality:               N/A;  BMI    Body Mass Index: 38.66 kg/m      Reproductive/Obstetrics negative OB ROS                             Anesthesia Physical Anesthesia Plan  ASA: 3  Anesthesia Plan: General LMA   Post-op Pain Management:    Induction: Intravenous  PONV Risk Score and Plan: Ondansetron, Dexamethasone, Midazolam and Treatment may vary due  to age or medical condition  Airway Management Planned:   Additional Equipment:   Intra-op Plan:   Post-operative Plan: Extubation in OR  Informed Consent: I have reviewed the patients History and Physical, chart, labs and discussed the procedure including the risks, benefits and alternatives for the proposed anesthesia with the patient or authorized representative who has indicated his/her understanding and acceptance.     Dental Advisory Given and Interpreter used for interview  Plan Discussed with: Anesthesiologist, CRNA and Surgeon  Anesthesia Plan Comments: (Patient consented for risks of anesthesia including but not limited to:  - adverse reactions to medications - damage to eyes, teeth, lips or other oral mucosa - nerve damage due to positioning  - sore throat or hoarseness - Damage to heart, brain, nerves, lungs, other parts of body or loss of life  Patient voiced understanding and assent.)       Anesthesia Quick Evaluation

## 2023-09-27 NOTE — H&P (Signed)
 No changes to below H&P.  The patient did have some drainage last night from her abscess.  I discussed with her that we will complete the incision and drainage and that she will need to have some packing in the abscess.  I also discussed that I am concerned that she has a fistula and in this case we will likely place a seton.  She understands his risk and wishes to proceed.  Patient ID: Regina Shea, female   DOB: April 10, 1981, 43 y.o.   MRN: [id number] CC: Left Gluteal Pain History of Present Illness Regina Shea is a 43 y.o. female with past medical history as listed below who is seen in consultation for left gluteal pain and swelling.  The patient reports that 3 to 4 days ago she noticed some increased swelling and pain around her anus.  She says that it started to feel like when she had an abscess in 2020.  She says that there has been swelling and it is extremely painful to touch.  She went to her primary care doctor for evaluation.  At that time she was diagnosed with cellulitis and given antibiotics.  She says however, that the pain has persisted despite antibiotics and that she has had increased swelling.  She denies any fevers or chills and denies any nausea or vomiting.  She is having normal bowel movements and denies any purulent drainage or blood in her bowel movements.   Of note, the patient had an exam under anesthesia with incision and drainage and partial fistulotomy was from the in 2020.  She says since then she has not had any persistent drainage or any problems.   She reports normal soft bowel movements without having to strain.  She has had 6 children and she says in her first 2 pregnancies she did sustain lacerations that required repair.  She has never had a colonoscopy denies any family history of inflammatory bowel disease or colon cancer.   Past Medical History     Past Medical History:  Diagnosis Date   Aneurysm (HCC) 2012    never confirmed by tests    Asthma      h/o no inhalers   Chest pain 01/20/2019   Fistula-in-ano     Gestational diabetes 05/23/2022   Headache      migraines   Vision loss 2012    states lost vision in [place] but it returned, they told her may be aneurysm, but never had follow up                 Past Surgical History:  Procedure Laterality Date   RECTAL EXAM UNDER ANESTHESIA N/A 01/23/2019    Procedure: RECTAL EXAM UNDER ANESTHESIA, with FISTULOTOMY and  FIBRIN GLUE;  Surgeon: Duanne Guess, MD;  Location: [facility] ORS;  Service: General;  Laterality: N/A;          Allergies  No Known Allergies           Current Outpatient Medications  Medication Sig Dispense Refill   acetaminophen-codeine (TYLENOL #3) 300-30 MG tablet Take 1 tablet by mouth every 8 (eight) hours as needed for moderate pain (pain score 4-6). 30 tablet 0   ibuprofen (ADVIL) 600 MG tablet Take 1 tablet (600 mg total) by mouth every 8 (eight) hours as needed. 60 tablet 3   sulfamethoxazole-trimethoprim (BACTRIM DS) 800-160 MG tablet Take 1 tablet by mouth 2 (two) times daily for 7 days. 14 tablet 0      No current facility-administered  medications for this visit.        Family History      Family History  Problem Relation Age of Onset   Hypertension Mother     Asthma Mother     Diabetes Father     Hypertension Father     Hypertension Brother     Cancer Maternal Uncle     Heart disease Neg Hx     Stroke Neg Hx              Social History Social History  Social History         Tobacco Use   Smoking status: Never      Passive exposure: Never   Smokeless tobacco: Never  Vaping Use   Vaping status: Never Used  Substance Use Topics   Alcohol use: No   Drug use: No            ROS Full ROS of systems performed and is otherwise negative there than what is stated in the HPI   Physical Exam Blood pressure 103/70, pulse 89, temperature 100.3 F (37.9 C), height 4\' 11"  (1.499 m), weight 198 lb (89.8 kg), last  menstrual period 08/30/2023, SpO2 99%, not currently breastfeeding.   Alert and oriented x 3, clear to auscultation bilaterally, regular rate and rhythm, abdomen soft, nontender and obese, rectal exam performed in the presence of a chaperone.  There is induration and swelling in the left buttock extending into the left anoderm slightly anterior.  There is an area of fluctuance just distal to the anal mucosa that is very painful to palpation.  I was unable to express any purulence from the wound.   Data Reviewed I reviewed her last operative note where she had a partial fistulotomy with application of fibrin glue.  I also reviewed her last labs and these were significant for a leukocytosis of 12,000 and a hemoglobin A1c of 5.9   I have personally reviewed the patient's imaging and medical records.     Assessment/Plan Assessment The patient is a 43 year old with 4-day history of anal pain and swelling with fluctuance.  On exam this is consistent with perirectal abscess.  I discussed with the patient that she would need formal incision and drainage and given the degree of swelling I do not think she would tolerate this in the office.  I also discussed with her that she may have a fistula.  We will perform an exam under anesthesia with I&D of perirectal abscess and possible seton placement.  Discussed the risk, benefits alternatives of the procedure including risk of further infection, bleeding, fistula formation and pelvic sepsis.  She understands these risks and wishes to proceed.  She ate today so we will plan to take her to the operating room in the morning.  She is currently on antibiotics prescribed her primary care doctor which she should continue.  I discussed that overnight tonight she can use warm compresses and sitz bath's for comfort         Kandis Cocking 09/26/2023, 1:17 PM

## 2023-09-27 NOTE — Op Note (Signed)
 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  General endotracheal anesthesia was induced and then she was placed in high lithotomy position.  All pressure points were correctly padded.  A surgical timeout was called identifying correct patient, site, side and procedure.  On the digital rectal exam there was an area that I felt like of induration within the left posterior lateral anal canal.  On exam under anesthesia we placed the Pratt anoscope within the anal canal.  There was a area just distal to the dentate line where there was a punctate opening that corresponded likely to a fistula tract.  The Pratt anoscope was then removed.  An incision was made in the most fluctuant area in the perirectal skin.  This was taken down into the abscess cavity.  The patient had previously had drainage overnight so there was just a small amount of pus that was able to be expressed.  Culture swabs were taken and sent to microbiology.  All loculations of the abscess cavity were broken up digitally and with a hemostat.  Using a lacrimal duct probe I was able to place the lacrimal duct probe through the fistula tract from the perirectal abscess and through the area of punctate opening within the anal canal.  A hemostat was then placed into this tract and a vessel loop was placed within the tract.  The vessel loop was secured with 0 silk tie.  Hemostasis was then obtained and the abscess cavity was then packed with moist Kerlix.  Prior to termination of the procedure all sponge instrument counts were correct x 2.  The wound was then dressed with gauze and mesh underwear.  The patient was then  placed back supine on the operating room table and awoken from general endotracheal anesthesia.  She was then transferred to the PACU in good condition.

## 2023-09-27 NOTE — Transfer of Care (Signed)
 Immediate Anesthesia Transfer of Care Note  Patient: Regina Shea  Procedure(s) Performed: EXAM UNDER ANESTHESIA (Rectum) INCISION AND DRAINAGE ABSCESS, perirectal (Rectum) PLACEMENT OF SETON (Rectum)  Patient Location: PACU  Anesthesia Type:General  Level of Consciousness: awake, alert , and oriented  Airway & Oxygen Therapy: Patient Spontanous Breathing and Patient connected to face mask oxygen  Post-op Assessment: Report given to RN and Post -op Vital signs reviewed and stable  Post vital signs: Reviewed and stable  Last Vitals:  Vitals Value Taken Time  BP 96/57 09/27/23 0817  Temp    Pulse 86 09/27/23 0819  Resp 12 09/27/23 0819  SpO2 100 % 09/27/23 0819  Vitals shown include unfiled device data.  Last Pain:  Vitals:   09/27/23 0627  TempSrc: Oral  PainSc: 8          Complications: No notable events documented.

## 2023-09-27 NOTE — Anesthesia Procedure Notes (Signed)
 Procedure Name: LMA Insertion Date/Time: 09/27/2023 7:35 AM  Performed by: Malva Cogan, CRNAPre-anesthesia Checklist: Patient identified, Patient being monitored, Timeout performed, Emergency Drugs available and Suction available Patient Re-evaluated:Patient Re-evaluated prior to induction Oxygen Delivery Method: Circle system utilized Preoxygenation: Pre-oxygenation with 100% oxygen Induction Type: IV induction Ventilation: Mask ventilation without difficulty LMA: LMA inserted LMA Size: 4.0 Tube type: Oral Number of attempts: 1 Placement Confirmation: positive ETCO2 and breath sounds checked- equal and bilateral Tube secured with: Tape Dental Injury: Teeth and Oropharynx as per pre-operative assessment

## 2023-09-27 NOTE — Anesthesia Postprocedure Evaluation (Signed)
 Anesthesia Post Note  Patient: Regina Shea  Procedure(s) Performed: EXAM UNDER ANESTHESIA (Rectum) INCISION AND DRAINAGE ABSCESS, perirectal (Rectum) PLACEMENT OF SETON (Rectum)  Patient location during evaluation: PACU Anesthesia Type: General Level of consciousness: awake and alert Pain management: pain level controlled Vital Signs Assessment: post-procedure vital signs reviewed and stable Respiratory status: spontaneous breathing, nonlabored ventilation, respiratory function stable and patient connected to nasal cannula oxygen Cardiovascular status: blood pressure returned to baseline and stable Postop Assessment: no apparent nausea or vomiting Anesthetic complications: no   No notable events documented.   Last Vitals:  Vitals:   09/27/23 0900 09/27/23 0904  BP: 115/73 (!) 120/108  Pulse: 94 93  Resp: 17   Temp: 37.2 C 37.4 C  SpO2: 97% 100%    Last Pain:  Vitals:   09/27/23 0904  TempSrc: Temporal  PainSc: 0-No pain                 Cleda Mccreedy Bliss Behnke

## 2023-09-27 NOTE — Progress Notes (Signed)
 Patient care performed with interpreter via Zenaida Niece [phone]

## 2023-09-27 NOTE — Progress Notes (Signed)
   09/27/23 0700  Spiritual Encounters  Type of Visit Initial  Care provided to: Patient  Conversation partners present during encounter Other (comment) Garment/textile technologist)  Referral source Chaplain assessment  Reason for visit Routine spiritual support  OnCall Visit No  Interventions  Spiritual Care Interventions Made Established relationship of care and support;Compassionate presence;Prayer  Intervention Outcomes  Outcomes Connection to spiritual care;Awareness around self/spiritual resourses;Reduced anxiety  Spiritual Care Plan  Spiritual Care Issues Still Outstanding No further spiritual care needs at this time (see row info)

## 2023-09-28 ENCOUNTER — Encounter: Payer: Self-pay | Admitting: General Surgery

## 2023-09-29 ENCOUNTER — Encounter: Payer: Self-pay | Admitting: Nurse Practitioner

## 2023-09-30 LAB — AEROBIC/ANAEROBIC CULTURE W GRAM STAIN (SURGICAL/DEEP WOUND)

## 2023-10-08 ENCOUNTER — Other Ambulatory Visit: Payer: Self-pay

## 2023-10-10 ENCOUNTER — Other Ambulatory Visit: Payer: Self-pay

## 2023-10-10 ENCOUNTER — Encounter: Payer: Self-pay | Admitting: General Surgery

## 2023-10-10 ENCOUNTER — Ambulatory Visit: Payer: Self-pay | Admitting: General Surgery

## 2023-10-10 VITALS — BP 113/76 | HR 80 | Temp 98.2°F | Ht 59.0 in | Wt 196.0 lb

## 2023-10-10 DIAGNOSIS — Z09 Encounter for follow-up examination after completed treatment for conditions other than malignant neoplasm: Secondary | ICD-10-CM

## 2023-10-10 DIAGNOSIS — K611 Rectal abscess: Secondary | ICD-10-CM

## 2023-10-10 MED ORDER — LIDOCAINE 5 % EX OINT
1.0000 | TOPICAL_OINTMENT | Freq: Three times a day (TID) | CUTANEOUS | 0 refills | Status: DC | PRN
  Filled 2023-10-10: qty 35.44, 10d supply, fill #0

## 2023-10-10 NOTE — Progress Notes (Signed)
 Outpatient Surgical Follow Up  10/10/2023  Regina Shea is an 43 y.o. female.   Chief Complaint  Patient presents with   Routine Post Op    HPI: The patient returns today status post incision and drainage of perirectal abscess with seton placement for fistula in anal.  She reports that she continues to have some pain in her rectum.  She says that where the incision was made she is not having much pain.  She backed up for 5 days and now is letting it heal.  She denies normal bowel movements.  She denies any drainage of purulence and she also denies any bleeding per rectum.  She says the pain is a burning pain worse when she stands.  Patient communication was aided by interpreter as patient is Spanish-speaking only.  Past Medical History:  Diagnosis Date   Aneurysm (HCC) 2012   never confirmed by tests   Asthma    h/o no inhalers   Chest pain 01/20/2019   Fistula-in-ano    Gestational diabetes 05/23/2022   Headache    migraines   Vision loss 2012   states lost vision in [place] but it returned, they told her may be aneurysm, but never had follow up    Past Surgical History:  Procedure Laterality Date   INCISION AND DRAINAGE ABSCESS N/A 09/27/2023   Procedure: INCISION AND DRAINAGE ABSCESS, perirectal;  Surgeon: Kandis Cocking, MD;  Location: [facility];  Service: General;  Laterality: N/A;   PLACEMENT OF SETON N/A 09/27/2023   Procedure: PLACEMENT OF SETON;  Surgeon: Kandis Cocking, MD;  Location: [facility];  Service: General;  Laterality: N/A;   RECTAL EXAM UNDER ANESTHESIA N/A 01/23/2019   Procedure: RECTAL EXAM UNDER ANESTHESIA, with FISTULOTOMY and  FIBRIN GLUE;  Surgeon: Duanne Guess, MD;  Location: [facility];  Service: General;  Laterality: N/A;    Family History  Problem Relation Age of Onset   Hypertension Mother    Asthma Mother    Diabetes Father    Hypertension Father    Hypertension Brother    Cancer Maternal Uncle    Heart disease Neg Hx     Stroke Neg Hx     Social History:  reports that she has never smoked. She has never been exposed to tobacco smoke. She has never used smokeless tobacco. She reports that she does not drink alcohol and does not use drugs.  Allergies: No Known Allergies  Medications reviewed.    ROS Full ROS performed and is otherwise negative other than what is stated in HPI   BP 113/76   Pulse 80   Temp 98.2 F (36.8 C)   Ht 4\' 11"  (1.499 m)   Wt 196 lb (88.9 kg)   LMP 10/03/2023 (Exact Date)   SpO2 98%   BMI 39.59 kg/m   Physical Exam No acute distress, normal work of breathing on room air.  Perirectal exam performed in the presence of a chaperone.  At the left anterior lateral position she has an area where I performed the incision and drainage.  This is healing up well.  There is a seton in place.  There is no drainage of mucus.  I did not perform a digital rectal exam today.    No results found for this or any previous visit (from the past 48 hours). No results found.  Assessment/Plan:  Patient status post incision and drainage of perirectal abscess with seton placement for fistula in ano.  She is having some  pain in her rectum.  We will prescribe her some topical anesthetic to help with the pain.  I also encouraged her to increase fiber intake.  She can use sitz bath's as needed.  We will see her again in 4 weeks.  I discussed that given her fistula that she will likely need an exam under anesthesia with fistulotomy.    Baker Pierini, M.D. [facility]

## 2023-10-10 NOTE — Patient Instructions (Addendum)
 You may use Lidocaine gel to the area 3 times a day for comfort. Puede usar gel de lidocana en la zona 3 veces al da para mayor comodidad. Keep the surgical area clean and dry.  Mantenga el rea quirrgica limpia y [place]. Follow up here in 1 month. Call us if you have any problems or questions. Haz un seguimiento aqu dentro de un mes. Llmanos si tienes algn problema o pregunta.  Fstula anal Anal Fistula  Una fstula anal es un orificio que se forma entre el intestino y la piel que est cerca de la abertura del trasero (ano).  El ano permite que la materia fecal (heces) salga del cuerpo. Est formado por muchas glndulas pequeas. En algunos casos, estas glndulas pueden taparse. Esto puede hacer que se forme un saco lleno de lquido (absceso). Puede producirse una fstula anal cuando un absceso se infecta. Cules son las causas? En la [employer], la causa de una fstula anal es un absceso. Otras causas son: Un problema debido a [place]. Una lesin en el recto o la zona que lo rodea. El uso de rayos de alta potencia (radiacin) para tratar la zona que rodea al recto. Qu incrementa el riesgo? Es ms probable que tenga una fstula anal si tiene ciertas afecciones. Estas incluyen las siguientes: Una enfermedad inflamatoria del intestino crnica, como la enfermedad de Crohn o la colitis ulcerosa. Cncer de colon o rectal. Enfermedad diverticular, como diverticulitis. Una infeccin de transmisin sexual (ITS), como gonorrea, clamidia o sfilis. Una infeccin causada por el virus de inmunodeficiencia humana (VIH). Cules son los signos o sntomas? Los sntomas de una fstula anal incluyen: Dolor pulstil o constante que puede empeorar cuando se sienta. Hinchazn o irritacin alrededor del ano. Pus o sangre que sale de un orificio cerca del ano. Dolor al defecar. Fiebre o escalofros. Cmo se diagnostica? Esta afeccin se diagnostica en funcin de un examen fsico. El  mdico puede: Buscar la abertura de la fstula en el exterior de su cuerpo. Buscar la abertura de la fstula en el interior de su cuerpo. Esto se puede hacer con una sonda o un colonoscopio. Hacer un examen del recto con una mano enguantada. Esto se denomina examen rectal. Tambin pueden hacerle pruebas. Pueden incluir: Pruebas de diagnstico por imgenes en las que se [place] un tinte para Clinical research associate la fstula, como las siguientes: Radiografas. Ecografa. Exploracin por tomografa computarizada (TC). Resonancia magntica (RM). Otros estudios para Production assistant, radio causa de la fstula. Estos pueden incluir anlisis de [place]. Cmo se trata? Una fstula anal a menudo se trata con [place]. Puede incluir: Una fistulotoma. Esto es cuando la fstula se abre y se drena. Esto ayuda a la curacin. La colocacin de un setn. Esto es cuando se coloca un hilo de seda (setn) en una fistulotoma. Esto ayuda a drenar cualquier infeccin. Procedimiento de colgajo de avance. Esto es cuando se extrae tejido del recto o de la piel de alrededor del ano. El tejido se une a Horticulturist, commercial. Tapn bioprotsico. Esto es cuando se hace un tapn en forma de cono con el tejido. Se utiliza para bloquear la fstula. Algunas fstulas anales no requieren [place]. En algunos casos, se pueden sellar con un adhesivo de fibrina. Es posible que tambin necesite tomar antibiticos. Siga estas instrucciones en su casa: Medicamentos Use los medicamentos de venta libre y los recetados solamente como se lo haya indicado el mdico. Si le recetaron antibiticos, tmelos como se lo haya indicado el mdico. No deje de  usar el antibitico aunque comience a Actor. Control del estreimiento Es posible que tenga que tomar estas medidas para prevenir o tratar el estreimiento: Product manager suficiente lquido para Radio producer pis (orina) de color amarillo plido. Usar medicamentos recetados o de Sales promotion account executive. Usar un ablandador de heces o un  medicamento que le ayude a defecar (laxante) segn las indicaciones de su mdico. Consumir alimentos ricos en fibra, como frijoles, cereales integrales, y frutas y verduras frescas. Limitar el consumo de alimentos ricos en grasa y azcares procesados, como los alimentos fritos o dulces. Instrucciones generales  [employer] de asiento durante 15 a 20 minutos, 3 o 4 veces por da, o como se lo haya indicado el mdico. Los baos de asiento pueden Engineer, materials y las [place]. Tambin pueden ayudar con la curacin. Mantenga la zona que rodea el ano lo ms limpia y seca posible. Use papel higinico hmedo o una toallita humedecida despus de defecar. Comunquese con un mdico si: Su dolor no se alivia con medicamentos. Tiene un nuevo enrojecimiento o hinchazn cerca del ano. Le sale un nuevo lquido, sangre o pus del ano. Siente sensibilidad o calor en la zona de alrededor del ano. Tiene fiebre o escalofros. Tiene diarrea. Solicite ayuda de inmediato si: Siente dolor intenso. Tiene problemas graves para hacer pis (orinar) o Advertising copywriter. Esta informacin no tiene Theme park manager el consejo del mdico. Asegrese de hacerle al mdico cualquier pregunta que tenga. Document Revised: 05/23/2022 Document Reviewed: 05/23/2022 Elsevier Patient Education  2024 [employer].

## 2023-10-21 ENCOUNTER — Other Ambulatory Visit: Payer: Self-pay

## 2023-10-28 ENCOUNTER — Encounter: Payer: Self-pay | Admitting: General Surgery

## 2023-11-12 ENCOUNTER — Encounter: Payer: Self-pay | Admitting: General Surgery

## 2023-11-12 ENCOUNTER — Ambulatory Visit: Payer: Self-pay | Admitting: General Surgery

## 2023-11-12 VITALS — BP 121/78 | HR 84 | Temp 98.6°F | Ht 64.0 in | Wt 196.6 lb

## 2023-11-12 DIAGNOSIS — K611 Rectal abscess: Secondary | ICD-10-CM

## 2023-11-12 DIAGNOSIS — K603 Anal fistula, unspecified: Secondary | ICD-10-CM

## 2023-11-12 DIAGNOSIS — K604 Rectal fistula, unspecified: Secondary | ICD-10-CM

## 2023-11-12 NOTE — Patient Instructions (Signed)
 Fistulotoma anal Anal Fistulotomy  Una fistulotoma anal es una ciruga para abrir y Sales executive una fstula en el ano. Una fstula anal es un tnel que se forma entre el intestino y la piel que est cerca del orificio del trasero (ano).  Abrir y drenar la fstula puede ayudar a que la zona cicatrice. Informe al mdico acerca de lo siguiente: Cualquier alergia que tenga. Todos los [employer] [place], incluidos vitaminas, hierbas, gotas oftlmicas, cremas y [address] de [address]. Cualquier problema que usted o los [employer] de su familia hayan tenido con el uso de anestesia. Cualquier problema de la sangre que tenga. Cirugas a las que se haya sometido. Cualquier afeccin mdica que tenga. Si est embarazada o podra estarlo. Cualquier problema que tenga para controlar cuando hace pis (orina) o defeca (incontinencia). Cules son los riesgos? El mdico hablar con usted [employer]. Estos pueden incluir: Infeccin. Sangrado. Reacciones alrgicas a los medicamentos o a los tintes. Daos a las Environmental consultant o a los Tax inspector. Incontinencia o prdida de materia fecal (heces). Incapacidad para vaciar la vejiga (retencin urinaria). Necesidad de [place]. Es posible que sea necesaria si la fstula vuelve a aparecer. Qu ocurre antes de la ciruga? Cundo dejar de comer y beber Siga las instrucciones del mdico respecto de lo que puede comer y beber. Estas pueden incluir: Ocho horas antes de la ciruga Deje de comer la mayora de los alimentos. No coma carne, alimentos fritos ni alimentos grasos. Consuma solo alimentos livianos, como tostadas o Social worker. Todos los lquidos son aceptables, excepto las bebidas energticas y el alcohol. Seis horas antes de la ciruga Deje de comer. Beba nicamente lquidos transparentes, como agua, jugo de fruta transparente, caf solo, t solo y bebidas deportivas. No consuma bebidas energticas ni alcohol. Dos horas antes de  la ciruga Deje de beber todos los lquidos. Es posible que le permitan tomar medicamentos con pequeos sorbos de [place]. Si no sigue las instrucciones del mdico, la ciruga puede retrasarse o cancelarse. Medicamentos Consulte a su mdico si debe o no hacer lo siguiente: Multimedia programmer o suspender los medicamentos que [place] habitualmente. Estos incluyen cualquier medicamento para la diabetes o anticoagulantes que use. Tomar medicamentos como aspirina e ibuprofeno. Estos medicamentos pueden tener un efecto anticoagulante en la [place]. No los tome, a menos que se lo indique el mdico. Usar medicamentos de venta libre, vitaminas, hierbas y suplementos. Es posible que le indiquen que tome un medicamento que lo ayude a Advertising copywriter (laxante). Tambin pueden indicarle que use un enema para limpiar el intestino. Estudios Pueden hacerle un examen o anlisis. Pueden extraerle Lauris Poag de [place] o pedirle una Joslin de pis [place]). Seguridad en la ciruga Pregntele al mdico qu medidas se tomarn para ayudar a prevenir una infeccin. Estas pueden incluir: Rasurar el vello del lugar de la ciruga. Lavar la piel con un jabn germicida. Tomar antibiticos. Indicaciones generales No consuma ningn producto que contenga nicotina o tabaco durante al [employer] 4 semanas anteriores a la [place]. Estos productos incluyen cigarrillos, tabaco para Theatre manager y aparatos de vapeo, como los Administrator, Civil Service. Si necesita ayuda para dejar de consumir estos productos, consulte al [employer]. Si va a marcharse a casa inmediatamente despus de la ciruga, pdale a un adulto responsable que: Lo lleve a su casa desde el hospital o la clnica. No se le permitir conducir. Lo cuide durante el [employer] indiquen. Qu ocurre durante la ciruga? Le colocarn una va intravenosa (i.v.) en una vena. Se le administrar  lo siguiente: Un sedante. Esto lo ayuda a relajarse. Anestesia. Esta impide que sienta dolor. Har que se quede  dormido para someterse a la [place]. La abertura de la fstula se encontrar dentro del cuerpo. Le harn una incisin. Es posible que se extienda a los msculos alrededor del ano (msculos del Corporate investment banker). Cortarn y drenarn la fstula. Le podrn colocar vendas de gasa (vendaje) en el interior de la fstula. La fstula se dejar abierta. Esto ayuda a que cicatrice como tejido cicatricial plano. La ciruga puede variar segn el mdico y el hospital. Kathlynn Pardon sucede despus de la ciruga? Le controlarn la presin arterial, la frecuencia cardaca, la frecuencia respiratoria y Air cabin crew de oxgeno en la sangre hasta que le den el alta del hospital o la clnica. Es posible que tenga algo de sangrado. Puede ser [employer] usar un apsito absorbente. Esta informacin no tiene Theme park manager el consejo del mdico. Asegrese de hacerle al mdico cualquier pregunta que tenga. Document Revised: 07/25/2022 Document Reviewed: 07/25/2022 Elsevier Patient Education  2024 [employer].

## 2023-11-14 NOTE — Progress Notes (Signed)
 Outpatient Surgical Follow Up  11/14/2023  Regina Shea is an 43 y.o. female.   Chief Complaint  Patient presents with   Routine Post Op    Perirectal Abscess     HPI: The patient returns today status post incision and drainage of perirectal abscess with insertion of seton.  This was done several weeks ago.  She reports that since then she continues to have burning pain when she has a bowel movement.  She says she is having soft vitamins and does not have to strain.  She is unsure if there is any drainage from the seton.  She denies any blood in her stool.  She is tolerating a diet.  She is using topical lidocaine for pain control.  Past Medical History:  Diagnosis Date   Aneurysm (HCC) 2012   never confirmed by tests   Asthma    h/o no inhalers   Chest pain 01/20/2019   Fistula-in-ano    Gestational diabetes 05/23/2022   Headache    migraines   Vision loss 2012   states lost vision in [place] but it returned, they told her may be aneurysm, but never had follow up    Past Surgical History:  Procedure Laterality Date   INCISION AND DRAINAGE ABSCESS N/A 09/27/2023   Procedure: INCISION AND DRAINAGE ABSCESS, perirectal;  Surgeon: Barrett Lick, MD;  Location: [facility] OR[facility];  Service: General;  Laterality: N/A;   PLACEMENT OF SETON N/A 09/27/2023   Procedure: PLACEMENT OF SETON;  Surgeon: Barrett Lick, MD;  Location: [facility] OR[facility];  Service: General;  Laterality: N/A;   RECTAL EXAM UNDER ANESTHESIA N/A 01/23/2019   Procedure: RECTAL EXAM UNDER ANESTHESIA, with FISTULOTOMY and  FIBRIN GLUE;  Surgeon: Mercy Stall, MD;  Location: [facility] OR[facility];  Service: General;  Laterality: N/A;    Family History  Problem Relation Age of Onset   Hypertension Mother    Asthma Mother    Diabetes Father    Hypertension Father    Hypertension Brother    Cancer Maternal Uncle    Heart disease Neg Hx    Stroke Neg Hx     Social History:  reports that she has never smoked. She has never  been exposed to tobacco smoke. She has never used smokeless tobacco. She reports that she does not drink alcohol and does not use drugs.  Allergies: No Known Allergies  Medications reviewed.    ROS Full ROS performed and is otherwise negative other than what is stated in HPI   BP 121/78   Pulse 84   Temp 98.6 F (37 C) (Oral)   Ht 5\' 4"  (1.626 m)   Wt 196 lb 9.6 oz (89.2 kg)   SpO2 99%   BMI 33.75 kg/m   Physical Exam Alert and oriented x 3, normal work of breathing room air, regular rate and rhythm, abdomen soft, nontender nondistended, rectal exam performed in the presence of a chaperone.  There is a seton in place at the site of incision and drainage.  On palpation of this there is expression of mucus and purulent discharge from the fistula tract.  On digital rectal exam there is no dominant masses or gross blood.    No results found for this or any previous visit (from the past 48 hours). No results found.  Assessment/Plan:  Patient is status post incision and drainage of perirectal abscess with seton placement for perirectal fistula.  I discussed with her that given that she still has some purulent drainage  from the fistula tract that I do not think is going to deal with the seton.  Therefore, I think we need to go to the operating room perform an exam under anesthesia with possible fistulotomy.  Of note, the patient has had previous partial fistulotomy with fibrin plug placement several years ago.  I discussed with her that if the fistula appears to traverse a significant portion of the muscle then she would need a referral to a colorectal surgeon for treatment of her fistula.  I also discussed the risk, benefits alternatives of the procedure including risk of infection, bleeding, incontinence and recurrence of abscess or fistula.  She understands this and wishes to proceed with surgery.  Severa Daniels, M.D. [facility]

## 2023-11-18 ENCOUNTER — Inpatient Hospital Stay
Admission: RE | Admit: 2023-11-18 | Discharge: 2023-11-18 | Disposition: A | Discharge status: Home / Self Care | Payer: Self-pay | Source: Ambulatory Visit

## 2023-11-18 NOTE — Pre-Procedure Instructions (Signed)
 Spoke Barbara at office notified her that multiple attempts were made and no contact could be made with patient or husband.

## 2023-11-18 NOTE — Patient Instructions (Signed)
 Your procedure is scheduled on: Wednesday April 23  Report to the Registration Desk on the 1st floor of the [facility] To find out your arrival time, please call [phone] between 1PM - 3PM on:  Tuesday April 22 If your arrival time is 6:00 am, do not arrive before that time as the [facility] entrance doors do not open until 6:00 am.  REMEMBER: Instructions that are not followed completely may result in serious medical risk, up to and including death; or upon the discretion of your surgeon and anesthesiologist your surgery may need to be rescheduled.  Do not eat food after midnight the night before surgery.  No gum chewing or hard candies.  You may however, drink CLEAR liquids up to 2 hours before you are scheduled to arrive for your surgery. Do not drink anything within 2 hours of your scheduled arrival time.  Clear liquids include: - water  - apple juice without pulp - gatorade (not RED colors) - black coffee or tea (Do NOT add milk or creamers to the coffee or tea) Do NOT drink anything that is not on this list.   One week prior to surgery: Stop Anti-inflammatories (NSAIDS) such as Advil , Aleve , Ibuprofen , Motrin , Naproxen , Naprosyn  and Aspirin based products such as Excedrin , Goody's Powder, BC Powder. Stop ANY OVER THE COUNTER supplements until after surgery.  You may however, continue to take Tylenol  if needed for pain up until the day of surgery.  Continue taking all of your other prescription medications up until the day of surgery.  ON THE DAY OF SURGERY DO NOT TAKE ANY MEDICATIONS  No Alcohol for 24 hours before or after surgery.  No Smoking including e-cigarettes for 24 hours before surgery.  No chewable tobacco products for at least 6 hours before surgery.  No nicotine patches on the day of surgery.  Do not use any "recreational" drugs for at least a week (preferably 2 weeks) before your surgery.  Please be advised that the combination of cocaine and  anesthesia may have negative outcomes, up to and including death. If you test positive for cocaine, your surgery will be cancelled.  On the morning of surgery brush your teeth with toothpaste and water, you may rinse your mouth with mouthwash if you wish. Do not swallow any toothpaste or mouthwash.  Do not wear jewelry, make-up, hairpins, clips or nail polish.  For welded (permanent) jewelry: bracelets, anklets, waist bands, etc.  Please have this removed prior to surgery.  If it is not removed, there is a chance that hospital personnel will need to cut it off on the day of surgery.  Do not wear lotions, powders, or perfumes.   Do not shave body hair from the neck down 48 hours before surgery.  Contact lenses, hearing aids and dentures may not be worn into surgery.  Do not bring valuables to the hospital. [facility] is not responsible for any missing/lost belongings or valuables.   Notify your doctor if there is any change in your medical condition (cold, fever, infection).  Wear comfortable clothing (specific to your surgery type) to the hospital.  After surgery, you can help prevent lung complications by doing breathing exercises.  Take deep breaths and cough every 1-2 hours.  If you are being discharged the day of surgery, you will not be allowed to drive home. You will need a responsible individual to drive you home and stay with you for 24 hours after surgery.   If you are taking public  transportation, you will need to have a responsible individual with you.  Please call the Pre-admissions Testing Dept. at [phone] if you have any questions about these instructions.  Surgery Visitation Policy:  Patients having surgery or a procedure may have two visitors.  Children under the age of 65 must have an adult with them who is not the patient.           Su procedimiento est programado para el mircoles 23 de abril. Presntese en el mostrador de registro, ubicado  en Information systems manager del [facility] Para conocer su hora de llegada, llame al [phone] entre la 1 p. m. y las 3 p. m. el martes 22 de abril. Si su hora de llegada es a las 6:00 a. m., no llegue antes, ya que las puertas de [place] del [address] no abren [employer] 6:00 a. m.  RECUERDE: El incumplimiento de las instrucciones puede resultar en un riesgo mdico grave, que puede incluir la [place]; o, a discrecin de su cirujano y Scientific laboratory technician, su ciruga podra tener que reprogramarse.  No consuma alimentos despus de la medianoche anterior a la ciruga.  No mastique chicle ni caramelos duros.  Sin embargo, puede beber lquidos claros hasta 2 horas antes de su hora de llegada programada para la [place]. No beba nada dentro de las 2 horas previas a su hora de llegada programada.  Los lquidos claros incluyen: - Agua - Jugo de manzana sin pulpa - Gatorade (sin colorantes ROJOS) - Caf o t negro (NO agregue leche ni crema al caf o t). NO beba nada que no est en esta lista.   Una semana antes de la ciruga: Suspenda los antiinflamatorios (AINE) como Advil , Aleve , ibuprofeno, Motrin , naproxeno, Naprosyn  y productos a base de aspirina como Excedrin , Goody's Powder y BC Powder. Suspenda cualquier suplemento de venta libre hasta despus de la ciruga.  Sin embargo, puede Educational psychologist tomando Tylenol  si lo necesita para Marketing executive de la [place].  Contine tomando todos sus dems medicamentos recetados hasta el da de la ciruga.  EL DA DE LA CIRUGA NO TOME NINGN MEDICAMENTO.  No consuma alcohol durante las 24 horas anteriores o posteriores a la [place].  No fume, incluidos los cigarrillos electrnicos, durante las 24 horas anteriores a la [place].  No consuma tabaco masticable durante al menos 6 horas antes de la [place].  No use parches de nicotina el da de la [place].  No consuma drogas recreativas durante al menos una semana (preferiblemente 2 semanas) antes de  la ciruga. Tenga en cuenta que la combinacin de cocana y anestesia puede tener consecuencias negativas, incluso la [place]. Si da positivo en la prueba de cocana, se cancelar la ciruga.    La maana de la ciruga, cepllese los dientes con pasta dental y agua; puede enjuagarse la boca con enjuague bucal si lo desea. No ingiera pasta dental ni enjuague bucal.  No use joyas, maquillaje, horquillas, broches ni esmalte de uas.  Para joyas soldadas (permanentes): pulseras, tobilleras, fajas, etc., quteselas antes de la ciruga. Si no se las [place], es posible que el personal del hospital tenga que cortarlas el da de la [place].  No use lociones, polvos ni perfumes.  No se afeite el vello corporal del cuello para abajo 48 horas antes de la [place].  No puede usar lentes de contacto, audfonos ni dentaduras postizas durante la [place].  No traiga objetos de valor al hospital. [facility] no se hace responsable de la prdida  de pertenencias o objetos de valor.  Notifique a su mdico si se produce algn cambio en su estado de salud (resfriado, fiebre, infeccin).  Lleve ropa cmoda (especfica para el tipo de [place]) al hospital.  Despus de la [place], puede ayudar a prevenir complicaciones pulmonares haciendo ejercicios de respiracin. Respire profundamente y tosa cada 1 o 2 horas.  Si le dan de alta el da de la ciruga, no se le permitir conducir a casa. Necesitar que una persona responsable lo lleve a casa y lo acompae durante 24 horas despus de la [place].  Si utiliza transporte pblico, necesitar estar acompaado por una persona responsable.  Si tiene [employer], llame al [employer] de Preadmisin al [phone].  Poltica de Visitas a la Ciruga:  Los pacientes que se sometan a [place] o procedimiento pueden [employer] visitantes. Los [employer] de 16 aos deben estar acompaados por un adulto que no sea [place].

## 2023-11-19 ENCOUNTER — Encounter
Admission: RE | Admit: 2023-11-19 | Discharge: 2023-11-19 | Disposition: A | Discharge status: Home / Self Care | Payer: Self-pay | Source: Ambulatory Visit | Attending: General Surgery | Admitting: General Surgery

## 2023-11-19 ENCOUNTER — Other Ambulatory Visit: Payer: Self-pay

## 2023-11-19 ENCOUNTER — Encounter: Payer: Self-pay | Admitting: General Surgery

## 2023-11-19 DIAGNOSIS — R079 Chest pain, unspecified: Secondary | ICD-10-CM

## 2023-11-19 DIAGNOSIS — K611 Rectal abscess: Secondary | ICD-10-CM

## 2023-11-19 DIAGNOSIS — Z01812 Encounter for preprocedural laboratory examination: Secondary | ICD-10-CM

## 2023-11-19 MED ORDER — LACTATED RINGERS IV SOLN: INTRAVENOUS | Status: DC

## 2023-11-19 MED ORDER — CHLORHEXIDINE GLUCONATE 0.12 % MT SOLN
15.0000 mL | Freq: Once | OROMUCOSAL | Status: AC
  Administered 2023-11-20: 15 mL via OROMUCOSAL

## 2023-11-19 MED ORDER — CHLORHEXIDINE GLUCONATE CLOTH 2 % EX PADS
6.0000 | MEDICATED_PAD | Freq: Once | CUTANEOUS | Status: DC
Start: 2023-11-19 — End: 2023-11-20

## 2023-11-19 MED ORDER — ORAL CARE MOUTH RINSE: 15.0000 mL | Freq: Once | OROMUCOSAL | Status: AC

## 2023-11-19 MED ORDER — CEFAZOLIN SODIUM-DEXTROSE 2-4 GM/100ML-% IV SOLN
2.0000 g | INTRAVENOUS | Status: AC
Start: 2023-11-20 — End: 2023-11-21
  Administered 2023-11-20: 2 g via INTRAVENOUS

## 2023-11-19 MED ORDER — CHLORHEXIDINE GLUCONATE CLOTH 2 % EX PADS: 6.0000 | MEDICATED_PAD | Freq: Once | CUTANEOUS | Status: DC

## 2023-11-19 NOTE — Patient Instructions (Addendum)
 Your procedure is scheduled on:   11/20/2023 Wednesday Report to the Registration Desk on the 1st floor of the [facility]. To find out your arrival time, please call [phone] between 1PM - 3PM on: 11/19/2023 Tuesday If your arrival time is 6:00 am, do not arrive before that time as the Medical Mall entrance doors do not open until 6:00 am.  REMEMBER: Instructions that are not followed completely may result in serious medical risk, up to and including death; or upon the discretion of your surgeon and anesthesiologist your surgery may need to be rescheduled.  Do not eat food after midnight the night before surgery.  No gum chewing or hard candies.    One week prior to surgery: Stop Anti-inflammatories (NSAIDS) such as Advil , Aleve , Ibuprofen , Motrin , Naproxen , Naprosyn  and Aspirin based products such as Excedrin , Goody's Powder, BC Powder and mobic Stop ANY OVER THE COUNTER supplements until after surgery.  You may however, continue to take Tylenol  if needed for pain up until the day of surgery.   Continue taking all of your other prescription medications up until the day of surgery.  ON THE DAY OF SURGERY ONLY TAKE THESE MEDICATIONS WITH SIPS OF WATER:        No Alcohol for 24 hours before or after surgery.  No Smoking including e-cigarettes for 24 hours before surgery.  No chewable tobacco products for at least 6 hours before surgery.  No nicotine patches on the day of surgery.  Do not use any "recreational" drugs for at least a week (preferably 2 weeks) before your surgery.  Please be advised that the combination of cocaine and anesthesia may have negative outcomes, up to and including death. If you test positive for cocaine, your surgery will be cancelled.  On the morning of surgery brush your teeth with toothpaste and water, you may rinse your mouth with mouthwash if you wish. Do not swallow any toothpaste or mouthwash.  Please shower on day of surgery.  Do not  wear jewelry, make-up, hairpins, clips or nail polish.   Do not wear lotions, powders, or perfumes.   Do not shave body hair from the neck down 48 hours before surgery.  Contact lenses, hearing aids and dentures may not be worn into surgery.  Do not bring valuables to the hospital. [facility] is not responsible for any missing/lost belongings or valuables.     Notify your doctor if there is any change in your medical condition (cold, fever, infection).  Wear comfortable clothing (specific to your surgery type) to the hospital.  After surgery, you can help prevent lung complications by doing breathing exercises.  Take deep breaths and cough every 1-2 hours. Your doctor may order a device called an Incentive Spirometer to help you take deep breaths.  If you are being admitted to the hospital overnight, leave your suitcase in the car. After surgery it may be brought to your room.   If you are being discharged the day of surgery, you will not be allowed to drive home. You will need a responsible individual to drive you home and stay with you for 24 hours after surgery.    Please call the Pre-admissions Testing Dept. at [phone] if you have any questions about these instructions.  Surgery Visitation Policy:  Patients having surgery or a procedure may have two visitors.  Children under the age of 95 must have an adult with them who is not the patient.

## 2023-11-19 NOTE — Pre-Procedure Instructions (Signed)
 PAT interview completed and pre-anesthesia instructions given to patient over the phone with the help of the interpreter. Patient contact number is [phone]. Since, patient is unable to pick up printed instructions so this RN reinforced and discussed the instructions over  the phone. Patient verbalized understanding.

## 2023-11-20 ENCOUNTER — Other Ambulatory Visit: Payer: Self-pay

## 2023-11-20 ENCOUNTER — Ambulatory Visit: Payer: Self-pay | Admitting: Urgent Care

## 2023-11-20 ENCOUNTER — Ambulatory Visit
Admission: RE | Admit: 2023-11-20 | Discharge: 2023-11-20 | Disposition: A | Discharge status: Home / Self Care | Payer: Self-pay | Attending: General Surgery | Admitting: General Surgery

## 2023-11-20 ENCOUNTER — Encounter: Payer: Self-pay | Admitting: General Surgery

## 2023-11-20 ENCOUNTER — Encounter
Admission: RE | Disposition: A | Discharge status: Home / Self Care | Payer: Self-pay | Source: Home / Self Care | Attending: General Surgery

## 2023-11-20 DIAGNOSIS — Z01812 Encounter for preprocedural laboratory examination: Secondary | ICD-10-CM

## 2023-11-20 DIAGNOSIS — K603 Anal fistula, unspecified: Secondary | ICD-10-CM

## 2023-11-20 DIAGNOSIS — E669 Obesity, unspecified: Secondary | ICD-10-CM | POA: Insufficient documentation

## 2023-11-20 DIAGNOSIS — R7303 Prediabetes: Secondary | ICD-10-CM | POA: Insufficient documentation

## 2023-11-20 DIAGNOSIS — K611 Rectal abscess: Secondary | ICD-10-CM

## 2023-11-20 DIAGNOSIS — Z6838 Body mass index (BMI) 38.0-38.9, adult: Secondary | ICD-10-CM | POA: Insufficient documentation

## 2023-11-20 DIAGNOSIS — R079 Chest pain, unspecified: Secondary | ICD-10-CM

## 2023-11-20 HISTORY — PX: RECTAL EXAM UNDER ANESTHESIA: SHX6399

## 2023-11-20 HISTORY — PX: ANAL FISTULOTOMY: SHX6423

## 2023-11-20 HISTORY — DX: Personal history of other diseases of the respiratory system: Z87.09

## 2023-11-20 HISTORY — DX: Migraine, unspecified, not intractable, without status migrainosus: G43.909

## 2023-11-20 LAB — POCT PREGNANCY, URINE: Preg Test, Ur: NEGATIVE

## 2023-11-20 SURGERY — EXAM UNDER ANESTHESIA, RECTUM
Anesthesia: General | Site: Rectum

## 2023-11-20 MED ORDER — MIDAZOLAM HCL 2 MG/2ML IJ SOLN
INTRAMUSCULAR | Status: AC
  Filled 2023-11-20: qty 2

## 2023-11-20 MED ORDER — CHLORHEXIDINE GLUCONATE 0.12 % MT SOLN
OROMUCOSAL | Status: AC
  Filled 2023-11-20: qty 15

## 2023-11-20 MED ORDER — ONDANSETRON HCL 4 MG/2ML IJ SOLN
INTRAMUSCULAR | Status: DC | PRN
  Administered 2023-11-20: 4 mg via INTRAVENOUS

## 2023-11-20 MED ORDER — ROCURONIUM BROMIDE 100 MG/10ML IV SOLN
INTRAVENOUS | Status: DC | PRN
  Administered 2023-11-20: 10 mg via INTRAVENOUS
  Administered 2023-11-20: 50 mg via INTRAVENOUS

## 2023-11-20 MED ORDER — BUPIVACAINE-EPINEPHRINE (PF) 0.5% -1:200000 IJ SOLN
INTRAMUSCULAR | Status: AC
Start: 2023-11-20 — End: ?
  Filled 2023-11-20: qty 20

## 2023-11-20 MED ORDER — OXYCODONE HCL 5 MG/5ML PO SOLN: 5.0000 mg | Freq: Once | ORAL | Status: DC | PRN

## 2023-11-20 MED ORDER — SUGAMMADEX SODIUM 200 MG/2ML IV SOLN
INTRAVENOUS | Status: DC | PRN
  Administered 2023-11-20: 200 mg via INTRAVENOUS

## 2023-11-20 MED ORDER — FENTANYL CITRATE (PF) 100 MCG/2ML IJ SOLN: 25.0000 ug | INTRAMUSCULAR | Status: DC | PRN

## 2023-11-20 MED ORDER — DEXMEDETOMIDINE HCL IN NACL 80 MCG/20ML IV SOLN
INTRAVENOUS | Status: DC | PRN
Start: 2023-11-20 — End: 2023-11-20
  Administered 2023-11-20: 8 ug via INTRAVENOUS

## 2023-11-20 MED ORDER — ACETAMINOPHEN 10 MG/ML IV SOLN
INTRAVENOUS | Status: DC | PRN
  Administered 2023-11-20: 1000 mg via INTRAVENOUS

## 2023-11-20 MED ORDER — CEFAZOLIN SODIUM-DEXTROSE 2-4 GM/100ML-% IV SOLN
INTRAVENOUS | Status: AC
  Filled 2023-11-20: qty 100

## 2023-11-20 MED ORDER — MIDAZOLAM HCL 5 MG/5ML IJ SOLN
INTRAMUSCULAR | Status: DC | PRN
  Administered 2023-11-20: 2 mg via INTRAVENOUS

## 2023-11-20 MED ORDER — PROPOFOL 10 MG/ML IV BOLUS
INTRAVENOUS | Status: DC | PRN
  Administered 2023-11-20: 150 mg via INTRAVENOUS

## 2023-11-20 MED ORDER — PROPOFOL 10 MG/ML IV BOLUS
INTRAVENOUS | Status: AC
  Filled 2023-11-20: qty 20

## 2023-11-20 MED ORDER — ONDANSETRON HCL 4 MG/2ML IJ SOLN: 4.0000 mg | Freq: Once | INTRAMUSCULAR | Status: DC | PRN

## 2023-11-20 MED ORDER — LACTATED RINGERS IV SOLN: INTRAVENOUS | Status: DC

## 2023-11-20 MED ORDER — OXYCODONE HCL 5 MG PO TABS
5.0000 mg | ORAL_TABLET | Freq: Four times a day (QID) | ORAL | 0 refills | Status: DC | PRN
Start: 2023-11-20 — End: 2024-01-02
  Filled 2023-11-20: qty 15, 4d supply, fill #0

## 2023-11-20 MED ORDER — BUPIVACAINE LIPOSOME 1.3 % IJ SUSP
INTRAMUSCULAR | Status: AC
  Filled 2023-11-20: qty 20

## 2023-11-20 MED ORDER — DEXAMETHASONE SODIUM PHOSPHATE 10 MG/ML IJ SOLN
INTRAMUSCULAR | Status: DC | PRN
  Administered 2023-11-20: 10 mg via INTRAVENOUS

## 2023-11-20 MED ORDER — DOCUSATE SODIUM 100 MG PO CAPS
100.0000 mg | ORAL_CAPSULE | Freq: Two times a day (BID) | ORAL | 0 refills | Status: DC
  Filled 2023-11-20: qty 10, 5d supply, fill #0

## 2023-11-20 MED ORDER — OXYCODONE HCL 5 MG PO TABS: 5.0000 mg | ORAL_TABLET | Freq: Once | ORAL | Status: DC | PRN

## 2023-11-20 MED ORDER — FENTANYL CITRATE (PF) 100 MCG/2ML IJ SOLN
INTRAMUSCULAR | Status: AC
  Filled 2023-11-20: qty 2

## 2023-11-20 MED ORDER — BUPIVACAINE LIPOSOME 1.3 % IJ SUSP
INTRAMUSCULAR | Status: DC | PRN
  Administered 2023-11-20: 20 mL

## 2023-11-20 MED ORDER — ACETAMINOPHEN 10 MG/ML IV SOLN: 1000.0000 mg | Freq: Once | INTRAVENOUS | Status: DC | PRN

## 2023-11-20 MED ORDER — BUPIVACAINE-EPINEPHRINE (PF) 0.5% -1:200000 IJ SOLN
INTRAMUSCULAR | Status: DC | PRN
  Administered 2023-11-20: 20 mL

## 2023-11-20 MED ORDER — FENTANYL CITRATE (PF) 100 MCG/2ML IJ SOLN
INTRAMUSCULAR | Status: DC | PRN
  Administered 2023-11-20 (×2): 50 ug via INTRAVENOUS

## 2023-11-20 MED ORDER — LIDOCAINE HCL (CARDIAC) PF 100 MG/5ML IV SOSY
PREFILLED_SYRINGE | INTRAVENOUS | Status: DC | PRN
  Administered 2023-11-20: 80 mg via INTRAVENOUS

## 2023-11-20 MED ORDER — ALBUTEROL SULFATE HFA 108 (90 BASE) MCG/ACT IN AERS
INHALATION_SPRAY | RESPIRATORY_TRACT | Status: DC | PRN
  Administered 2023-11-20: 4 via RESPIRATORY_TRACT

## 2023-11-20 SURGICAL SUPPLY — 23 items
BRIEF MESH DISP 2XL (UNDERPADS AND DIAPERS) ×2 IMPLANT
DISSECTOR SURG LIGASURE 21 (MISCELLANEOUS) IMPLANT
DRAPE LAPAROTOMY 100X77 ABD (DRAPES) ×2 IMPLANT
DRSG GAUZE FLUFF 36X18 (GAUZE/BANDAGES/DRESSINGS) ×2 IMPLANT
ELECTRODE REM PT RTRN 9FT ADLT (ELECTROSURGICAL) ×2 IMPLANT
GLOVE BIOGEL PI IND STRL 7.5 (GLOVE) ×2 IMPLANT
GLOVE SURG SYN 7.0 (GLOVE) ×4 IMPLANT
GLOVE SURG SYN 7.0 PF PI (GLOVE) ×2 IMPLANT
GOWN STRL REUS W/ TWL LRG LVL3 (GOWN DISPOSABLE) ×4 IMPLANT
KIT TURNOVER KIT A (KITS) ×2 IMPLANT
LABEL OR SOLS (LABEL) ×2 IMPLANT
LOOP VESSEL MAXI 1X406 RED (MISCELLANEOUS) IMPLANT
MANIFOLD NEPTUNE II (INSTRUMENTS) ×2 IMPLANT
NDL HYPO 22X1.5 SAFETY MO (MISCELLANEOUS) ×2 IMPLANT
NEEDLE HYPO 22X1.5 SAFETY MO (MISCELLANEOUS) ×2 IMPLANT
NS IRRIG 500ML POUR BTL (IV SOLUTION) ×2 IMPLANT
PACK BASIN MINOR ARMC (MISCELLANEOUS) ×2 IMPLANT
SOLUTION PREP PVP 2OZ (MISCELLANEOUS) ×2 IMPLANT
SURGILUBE 2OZ TUBE FLIPTOP (MISCELLANEOUS) ×2 IMPLANT
SUT VIC AB 2-0 SH 27XBRD (SUTURE) IMPLANT
SYR 10ML LL (SYRINGE) ×2 IMPLANT
TRAP FLUID SMOKE EVACUATOR (MISCELLANEOUS) ×2 IMPLANT
WATER STERILE IRR 500ML POUR (IV SOLUTION) ×2 IMPLANT

## 2023-11-20 NOTE — H&P (Signed)
 No changes to below H and P, proceed as planned  Chief Complaint  Patient presents with   Routine Post Op      Perirectal Abscess       HPI: The patient returns today status post incision and drainage of perirectal abscess with insertion of seton.  This was done several weeks ago.  She reports that since then she continues to have burning pain when she has a bowel movement.  She says she is having soft vitamins and does not have to strain.  She is unsure if there is any drainage from the seton.  She denies any blood in her stool.  She is tolerating a diet.  She is using topical lidocaine  for pain control.       Past Medical History:  Diagnosis Date   Aneurysm (HCC) 2012    never confirmed by tests   Asthma      h/o no inhalers   Chest pain 01/20/2019   Fistula-in-ano     Gestational diabetes 05/23/2022   Headache      migraines   Vision loss 2012    states lost vision in [place] but it returned, they told her may be aneurysm, but never had follow up               Past Surgical History:  Procedure Laterality Date   INCISION AND DRAINAGE ABSCESS N/A 09/27/2023    Procedure: INCISION AND DRAINAGE ABSCESS, perirectal;  Surgeon: Barrett Lick, MD;  Location: [facility] ORS;  Service: General;  Laterality: N/A;   PLACEMENT OF SETON N/A 09/27/2023    Procedure: PLACEMENT OF SETON;  Surgeon: Barrett Lick, MD;  Location: [facility] ORS;  Service: General;  Laterality: N/A;   RECTAL EXAM UNDER ANESTHESIA N/A 01/23/2019    Procedure: RECTAL EXAM UNDER ANESTHESIA, with FISTULOTOMY and  FIBRIN GLUE;  Surgeon: Mercy Stall, MD;  Location: [facility] ORS;  Service: General;  Laterality: N/A;               Family History  Problem Relation Age of Onset   Hypertension Mother     Asthma Mother     Diabetes Father     Hypertension Father     Hypertension Brother     Cancer Maternal Uncle     Heart disease Neg Hx     Stroke Neg Hx            Social History:  reports that she has never  smoked. She has never been exposed to tobacco smoke. She has never used smokeless tobacco. She reports that she does not drink alcohol and does not use drugs.   Allergies:  Allergies  No Known Allergies     Medications reviewed.       ROS Full ROS performed and is otherwise negative other than what is stated in HPI     BP 121/78   Pulse 84   Temp 98.6 F (37 C) (Oral)   Ht 5\' 4"  (1.626 m)   Wt 196 lb 9.6 oz (89.2 kg)   SpO2 99%   BMI 33.75 kg/m    Physical Exam Alert and oriented x 3, normal work of breathing room air, regular rate and rhythm, abdomen soft, nontender nondistended, rectal exam performed in the presence of a chaperone.  There is a seton in place at the site of incision and drainage.  On palpation of this there is expression of mucus and purulent discharge from the fistula tract.  On digital  rectal exam there is no dominant masses or gross blood.       Lab Results Last 48 Hours  No results found for this or any previous visit (from the past 48 hours).   Imaging Results (Last 48 hours)  No results found.     Assessment/Plan:   Patient is status post incision and drainage of perirectal abscess with seton placement for perirectal fistula.  I discussed with her that given that she still has some purulent drainage from the fistula tract that I do not think is going to deal with the seton.  Therefore, I think we need to go to the operating room perform an exam under anesthesia with possible fistulotomy.  Of note, the patient has had previous partial fistulotomy with fibrin plug placement several years ago.  I discussed with her that if the fistula appears to traverse a significant portion of the muscle then she would need a referral to a colorectal surgeon for treatment of her fistula.  I also discussed the risk, benefits alternatives of the procedure including risk of infection, bleeding, incontinence and recurrence of abscess or fistula.  She understands this and  wishes to proceed with surgery.   Severa Daniels, M.D. [facility]

## 2023-11-20 NOTE — Op Note (Signed)
 Operative note  Preoperative diagnosis: Anal fistula Postoperative diagnosis: Anal fistula Surgeon: Severa Daniels, MD EBL: 5 cc Procedure: Exam under anesthesia, anal fistulotomy  After informed consent was obtained the patient was brought to the operating room.  She was intubated in the hospital gurney and then placed prone on the operating room table.  Her buttocks was then prepped and draped in the usual sterile fashion.  A surgical timeout was called identifying correct patient, site, side and procedure.  A perianal skin was infiltrated with 20 cc of liposomal bupivacaine  and Marcaine  solution.  The deep M[place]oe anal space was then also infiltrated with another 20 cc of liposomal bupivacaine  and Marcaine  solution.  A digital rectal exam was performed.  There was presence of a vessel loop that I had placed previously and that appeared to be in an intersphincteric fistula tract.  There was no dominant masses or lesions on digital rectal exam.  Using a Pratt anoscope I examined the anal canal.  There was some hemorrhoidal tissue at the right posterior.  In the left anterior anal canal there was a vessel loop again that I had previously placed.  I inserted a lacrimal duct probe through this tract.  This was in between the muscle complexes.  It was quite shallow and did not involve much of the sphincter complex so I decided to perform a fistulotomy.  Using a Bovie cautery the fistula was filleted open.  There was some feculent fluid that was released when the fistula was laid open.  The fistula tract was then curetted.  Hemostasis was then obtained with electrocautery and direct pressure.  A piece of Xeroform was placed on the fistula tract and this completed the procedure.  Prior to termination all sponge instrument counts were correct x 2.  The patient was then placed supine on the hospital gurney and extubated.  She was taken the PACU in good condition.  Prior to termination of the procedure all sponge  instrument counts were correct x 2

## 2023-11-20 NOTE — Transfer of Care (Signed)
 Immediate Anesthesia Transfer of Care Note  Patient: Regina Shea  Procedure(s) Performed: EXAM UNDER ANESTHESIA, RECTUM (Rectum) ANAL FISTULOTOMY  Patient Location: PACU  Anesthesia Type:General  Level of Consciousness: drowsy  Airway & Oxygen Therapy: Patient Spontanous Breathing  Post-op Assessment: Report given to RN and Post -op Vital signs reviewed and stable  Post vital signs: Reviewed and stable  Last Vitals:  Vitals Value Taken Time  BP 110/68 11/20/23 1030  Temp    Pulse 94 11/20/23 1031  Resp 16 11/20/23 1031  SpO2 98 % 11/20/23 1031  Vitals shown include unfiled device data.  Last Pain:  Vitals:   11/20/23 0914  TempSrc: Temporal  PainSc: 0-No pain         Complications: No notable events documented.

## 2023-11-20 NOTE — Anesthesia Preprocedure Evaluation (Addendum)
 Anesthesia Evaluation  Patient identified by MRN, date of birth, ID band Patient awake    Reviewed: Allergy & Precautions, NPO status , Patient's Chart, lab work & pertinent test results  History of Anesthesia Complications Negative for: history of anesthetic complications  Airway Mallampati: III   Neck ROM: Full    Dental  (+) Implants   Pulmonary asthma    Pulmonary exam normal breath sounds clear to auscultation       Cardiovascular Exercise Tolerance: Good negative cardio ROS Normal cardiovascular exam Rhythm:Regular Rate:Normal  ECG 11/20/23: normal   Neuro/Psych  Headaches    GI/Hepatic negative GI ROS,,,  Endo/Other  diabetes (gestational, now prediabetes)  Obesity   Renal/GU negative Renal ROS     Musculoskeletal   Abdominal   Peds  Hematology negative hematology ROS (+)   Anesthesia Other Findings   Reproductive/Obstetrics                             Anesthesia Physical Anesthesia Plan  ASA: 2  Anesthesia Plan: General   Post-op Pain Management:    Induction: Intravenous  PONV Risk Score and Plan: 3 and Ondansetron , Dexamethasone  and Treatment may vary due to age or medical condition  Airway Management Planned: LMA  Additional Equipment:   Intra-op Plan:   Post-operative Plan: Extubation in OR  Informed Consent: I have reviewed the patients History and Physical, chart, labs and discussed the procedure including the risks, benefits and alternatives for the proposed anesthesia with the patient or authorized representative who has indicated his/her understanding and acceptance.     Dental advisory given  Plan Discussed with: CRNA  Anesthesia Plan Comments: (History and consent obtained in Spanish. Patient consented for risks of anesthesia including but not limited to:  - adverse reactions to medications - damage to eyes, teeth, lips or other oral mucosa - nerve  damage due to positioning  - sore throat or hoarseness - damage to heart, brain, nerves, lungs, other parts of body or loss of life  Informed patient about role of CRNA in peri- and intra-operative care.  Patient voiced understanding.)        Anesthesia Quick Evaluation

## 2023-11-20 NOTE — Anesthesia Postprocedure Evaluation (Signed)
 Anesthesia Post Note  Patient: Regina Shea  Procedure(s) Performed: EXAM UNDER ANESTHESIA, RECTUM (Rectum) ANAL FISTULOTOMY  Patient location during evaluation: PACU Anesthesia Type: General Level of consciousness: awake and alert, oriented and patient cooperative Pain management: pain level controlled Vital Signs Assessment: post-procedure vital signs reviewed and stable Respiratory status: spontaneous breathing, nonlabored ventilation and respiratory function stable Cardiovascular status: blood pressure returned to baseline and stable Postop Assessment: adequate PO intake Anesthetic complications: no   No notable events documented.   Last Vitals:  Vitals:   11/20/23 1105 11/20/23 1110  BP:    Pulse: 67 63  Resp: 16 13  Temp:    SpO2: 99% 96%    Last Pain:  Vitals:   11/20/23 1100  TempSrc:   PainSc: 0-No pain                 Dorothey Gate

## 2023-11-20 NOTE — Anesthesia Procedure Notes (Signed)
 Procedure Name: Intubation Date/Time: 11/20/2023 9:45 AM  Performed by: Donnamae Gaba, CRNAPre-anesthesia Checklist: Patient identified, Emergency Drugs available, Suction available and Patient being monitored Patient Re-evaluated:Patient Re-evaluated prior to induction Oxygen Delivery Method: Circle system utilized Preoxygenation: Pre-oxygenation with 100% oxygen Induction Type: IV induction Ventilation: Mask ventilation without difficulty Laryngoscope Size: McGrath and 3 Grade View: Grade I Tube type: Oral Tube size: 7.0 mm Number of attempts: 1 Airway Equipment and Method: Stylet Placement Confirmation: ETT inserted through vocal cords under direct vision, positive ETCO2 and breath sounds checked- equal and bilateral Secured at: 20 cm Tube secured with: Tape Dental Injury: Teeth and Oropharynx as per pre-operative assessment

## 2023-11-21 ENCOUNTER — Encounter: Payer: Self-pay | Admitting: General Surgery

## 2023-12-05 ENCOUNTER — Encounter: Payer: Self-pay | Admitting: General Surgery

## 2023-12-05 ENCOUNTER — Ambulatory Visit: Payer: Self-pay | Admitting: General Surgery

## 2023-12-05 VITALS — BP 119/78 | HR 75 | Ht 59.0 in

## 2023-12-05 DIAGNOSIS — Z09 Encounter for follow-up examination after completed treatment for conditions other than malignant neoplasm: Secondary | ICD-10-CM

## 2023-12-05 DIAGNOSIS — K603 Anal fistula, unspecified: Secondary | ICD-10-CM

## 2023-12-05 NOTE — Patient Instructions (Addendum)
 the area is still healing. you still have some open areas and this will create some pain.  El rea an se est curando. An tienes algunas reas abiertas y esto crear algo de dolor.  Continue to use the cream that we prescribed.  Contine usando la crema que le recetamos.  Follow up here in 4 weeks. Call us  with any questions.  Seguimiento aqu en 4 semanas. Llmenos si tiene alguna pregunta.    Cmo tomar un bao de asiento How to Take a [employer] Un bao de asiento es un bao de agua tibia que se puede usar para cuidar el recto, la zona genital o la zona entre el recto y los genitales (perineo). En un bao de asiento, el agua solamente llega [employer] caderas y [place] las nalgas. Un bao de asiento puede hacerse en la baera o en una tina porttil para bao de asiento que se coloca sobre el inodoro. Su mdico puede recomendar un bao de asiento para ayudarlo con lo siguiente: Aliviar el dolor y las molestias despus de dar a Patent examiner. Aliviar el dolor y la picazn causados por las hemorroides o las fisuras anales. Aliviar el dolor despus de determinadas cirugas. Relajar los msculos doloridos o tensos. Cmo tomar un bao de [employer] 2 y 4 baos de asiento diarios, o tantos como le haya indicado el mdico. Bao de asiento en la baera Para tomar un bao de asiento en una baera: Llene parte de la baera con agua tibia. El agua debe tener la profundidad suficiente para cubrirle las caderas y las nalgas cuando est sentado en la baera. Siga las instrucciones de su mdico si le indica que ponga medicamentos en el agua. Sintese en el agua. Abra un poco el drenaje de la baera y djelo abierto durante su bao. Abra el agua tibia nuevamente, lo suficiente para reponer Firefighter. Deje correr el agua durante todo su bao. Esto ayuda a Surveyor, minerals en el nivel adecuado y Retail buyer. Sumrjase en el agua entre 15 y 20 minutos, o el tiempo que le haya indicado el  mdico. Cuando termine, tenga cuidado al ponerse de pie. Puede sentirse mareado. Luego del bao de asiento, squese con golpecitos suaves. No frote la piel para secarla.  Bao de asiento sobre el inodoro Para tomar un bao de asiento con un recipiente sobre el inodoro: Siga las instrucciones del fabricante. Llene el recipiente con agua tibia. Siga las instrucciones de su mdico si le indic que ponga medicamentos en el agua. Sintese en el asiento. Asegrese de que el agua le cubra las nalgas y el perineo. Sumrjase en el agua entre 15 y 20 minutos, o el tiempo que le haya indicado el mdico. Luego del bao de asiento, squese con golpecitos suaves. No frote la piel para secarla. Limpie y seque la tina despus de cada uso. Deseche el recipiente si se agrieta o segn las instrucciones del fabricante.  Comunquese con un mdico si: El dolor o la picazn [place]. Deje de hacerse baos de asiento si los sntomas [place]. Aparecen nuevos sntomas. Deje de hacerse baos de asiento hasta que hable con el mdico. Resumen Un bao de asiento es un bao con agua tibia en el cual el agua solo le llega hasta la cadera y cubre las nalgas. El mdico puede recomendarle un bao de asiento para ayudar a Engineer, materials y las molestias despus de un parto, Engineer, materials y la picazn de hemorroides o fisuras anales, aliviar  el dolor despus de ciertas cirugas o contribuir a International aid/development worker los msculos doloridos o tensos. [employer] 2 y 4 baos de asiento diarios, o tantos como le haya indicado el mdico. Sumrjase en el agua entre 15 y 20 minutos. Deje de hacerse baos de asiento si los sntomas [place]. Esta informacin no tiene Theme park manager el consejo del mdico. Asegrese de hacerle al mdico cualquier pregunta que tenga. Document Revised: 11/09/2021 Document Reviewed: 11/09/2021 Elsevier Patient Education  2024 [employer].

## 2023-12-05 NOTE — Progress Notes (Signed)
 Outpatient Surgical Follow Up  12/05/2023  Regina Shea is an 43 y.o. female.   Chief Complaint  Patient presents with   Routine Post Op    HPI: Patient returns today status post exam under anesthesia and anal fistulotomy.  She reports doing well.  She denies any mucus discharge.  She does report that there is some small amounts of blood in her underwear intermittently.  She says that in the morning she does have some pain in her anus and some pain when she passes bowel movements but this is much improved from before her surgery.  She continues to use topical lidocaine  and says that this is helping.  Past Medical History:  Diagnosis Date   Aneurysm (HCC) 2012   never confirmed by tests   Chest pain 01/20/2019   Fistula-in-ano    Gestational diabetes 05/23/2022   History of asthma    Lipoma 2022   Migraines    Perirectal abscess 09/27/2023   Urethral laceration 09/27/2022   Vision loss 2012   states lost vision in [place] but it returned, they told her may be aneurysm, but never had follow up    Past Surgical History:  Procedure Laterality Date   ANAL FISTULOTOMY N/A 11/20/2023   Procedure: ANAL FISTULOTOMY;  Surgeon: Barrett Lick, MD;  Location: [facility];  Service: General;  Laterality: N/A;   INCISION AND DRAINAGE ABSCESS N/A 09/27/2023   Procedure: INCISION AND DRAINAGE ABSCESS, perirectal;  Surgeon: Barrett Lick, MD;  Location: [facility];  Service: General;  Laterality: N/A;   PLACEMENT OF SETON N/A 09/27/2023   Procedure: PLACEMENT OF SETON;  Surgeon: Barrett Lick, MD;  Location: [facility];  Service: General;  Laterality: N/A;   RECTAL EXAM UNDER ANESTHESIA N/A 01/23/2019   Procedure: RECTAL EXAM UNDER ANESTHESIA, with FISTULOTOMY and  FIBRIN GLUE;  Surgeon: Mercy Stall, MD;  Location: [facility];  Service: General;  Laterality: N/A;   RECTAL EXAM UNDER ANESTHESIA N/A 11/20/2023   Procedure: EXAM UNDER ANESTHESIA, RECTUM;  Surgeon: Barrett Lick, MD;   Location: [facility];  Service: General;  Laterality: N/A;    Family History  Problem Relation Age of Onset   Hypertension Mother    Asthma Mother    Diabetes Father    Hypertension Father    Hypertension Brother    Cancer Maternal Uncle    Heart disease Neg Hx    Stroke Neg Hx     Social History:  reports that she has never smoked. She has never been exposed to tobacco smoke. She has never used smokeless tobacco. She reports that she does not drink alcohol and does not use drugs.  Allergies: No Known Allergies  Medications reviewed.    ROS Full ROS performed and is otherwise negative other than what is stated in HPI   BP 119/78   Pulse 75   Ht 4\' 11"  (1.499 m)   LMP 11/05/2023   SpO2 98%   BMI 39.59 kg/m   Physical Exam  Alert and oriented x 3, normal breathing on room air, regular rate and rhythm, anorectal exam performed in the presence of a chaperone.  At the area of fistulotomy it is healing up well.  There is still some granulation tissue coming in with some small amount of fibrinous exudate.  I deferred anoscopy and digital rectal exam given that she is so soon from surgery.   No results found for this or any previous visit (from the past 48 hours). No results  found.  Assessment/Plan:  Patient status post exam under anesthesia and anal fistulotomy.  She is healing up as expected.  Her pain seems to be improving.  I discussed with her to continue good bowel hygiene including having soft, frequent bowel movements and not straining on the toilet.  She should continue using lidocaine  cream as needed.  Will plan to see her again in 4 weeks.   Severa Daniels, M.D. [facility]

## 2024-01-02 ENCOUNTER — Ambulatory Visit: Payer: Self-pay | Admitting: General Surgery

## 2024-01-02 ENCOUNTER — Encounter: Payer: Self-pay | Admitting: General Surgery

## 2024-01-02 VITALS — BP 118/76 | HR 98 | Temp 98.0°F | Ht 59.0 in | Wt 194.0 lb

## 2024-01-02 DIAGNOSIS — K603 Anal fistula, unspecified: Secondary | ICD-10-CM

## 2024-01-02 NOTE — Progress Notes (Signed)
 Outpatient Surgical Follow Up CC: Fistula In ano s/p fistulotomy 01/02/2024  Regina Shea is an 43 y.o. female.   Chief Complaint  Patient presents with   Routine Post Op    HPI: The patient returns today status post fistulotomy for fistula in ano.  She reports that for the first time in many months she is doing well.  She reports that she is having soft bowel movements without any pain.  She denies any drainage from her anus.  She denies any blood.  She is tolerating a regular diet.  Past Medical History:  Diagnosis Date   Aneurysm (HCC) 2012   never confirmed by tests   Chest pain 01/20/2019   Fistula-in-ano    Gestational diabetes 05/23/2022   History of asthma    Lipoma 2022   Migraines    Perirectal abscess 09/27/2023   Urethral laceration 09/27/2022   Vision loss 2012   states lost vision in [place] but it returned, they told her may be aneurysm, but never had follow up    Past Surgical History:  Procedure Laterality Date   ANAL FISTULOTOMY N/A 11/20/2023   Procedure: ANAL FISTULOTOMY;  Surgeon: Barrett Lick, MD;  Location: [facility];  Service: General;  Laterality: N/A;   INCISION AND DRAINAGE ABSCESS N/A 09/27/2023   Procedure: INCISION AND DRAINAGE ABSCESS, perirectal;  Surgeon: Barrett Lick, MD;  Location: [facility];  Service: General;  Laterality: N/A;   PLACEMENT OF SETON N/A 09/27/2023   Procedure: PLACEMENT OF SETON;  Surgeon: Barrett Lick, MD;  Location: [facility];  Service: General;  Laterality: N/A;   RECTAL EXAM UNDER ANESTHESIA N/A 01/23/2019   Procedure: RECTAL EXAM UNDER ANESTHESIA, with FISTULOTOMY and  FIBRIN GLUE;  Surgeon: Mercy Stall, MD;  Location: [facility];  Service: General;  Laterality: N/A;   RECTAL EXAM UNDER ANESTHESIA N/A 11/20/2023   Procedure: EXAM UNDER ANESTHESIA, RECTUM;  Surgeon: Barrett Lick, MD;  Location: [facility];  Service: General;  Laterality: N/A;    Family History  Problem Relation Age of Onset    Hypertension Mother    Asthma Mother    Diabetes Father    Hypertension Father    Hypertension Brother    Cancer Maternal Uncle    Heart disease Neg Hx    Stroke Neg Hx     Social History:  reports that she has never smoked. She has never been exposed to tobacco smoke. She has never used smokeless tobacco. She reports that she does not drink alcohol and does not use drugs.  Allergies: No Known Allergies  Medications reviewed.    ROS Full ROS performed and is otherwise negative other than what is stated in HPI   BP 118/76   Pulse 98   Temp 98 F (36.7 C)   Ht 4\' 11"  (1.499 m)   Wt 194 lb (88 kg)   LMP 12/02/2023 (Exact Date)   SpO2 98%   BMI 39.18 kg/m   Physical Exam  Alert and oriented x 3, normal work of breathing on room air, regular rate and rhythm, digital rectal exam performed in the presence of a chaperone.  On inspection there is an area in the left anterior lateral position that is where she had her abscess that is well-healed.  The fistula tract is gone and this is healed up.  Anoscopy performed and there is no evidence of internal opening from a fistula.  It appears to have healed well.   No results found for  this or any previous visit (from the past 48 hours). No results found.  Assessment/Plan: Patient status post anal fistulotomy for fistula in ano.  She is doing well.  Continue fiber supplementation and I recommended that she get a colonoscopy in 3 years.  She can follow-up with us .   Severa Daniels, M.D. [facility]

## 2024-01-02 NOTE — Patient Instructions (Addendum)
 We recommend taking a daily fiber supplement like Metamucil or Benefiber. There are powders and gummies that you can try.   Recomendamos tomar un suplemento diario de fibra como Metamucil o Benefiber. Existen polvos y gomitas que puedes probar.  You will be due for a Colonoscopy in 3 years at age 43. This is a screening for colon cancer.   Ser necesario que se haga una colonoscopia dentro de 3 aos, cuando cumpla 45 aos. Se trata de una prueba de deteccin del cncer de colon.  Follow-up with our office as needed.  Please call and ask to speak with a nurse if you develop questions or concerns. Consulte con nuestra oficina segn sea necesario.  Si tiene alguna pregunta o inquietud, llame y pida hablar con una enfermera.

## 2024-02-17 ENCOUNTER — Other Ambulatory Visit: Payer: Self-pay

## 2024-02-17 ENCOUNTER — Encounter: Payer: Self-pay | Admitting: Dermatology

## 2024-02-17 ENCOUNTER — Ambulatory Visit (INDEPENDENT_AMBULATORY_CARE_PROVIDER_SITE_OTHER): Payer: Self-pay | Admitting: Dermatology

## 2024-02-17 VITALS — BP 129/87 | HR 90

## 2024-02-17 DIAGNOSIS — Z719 Counseling, unspecified: Secondary | ICD-10-CM

## 2024-02-17 DIAGNOSIS — E882 Lipomatosis, not elsewhere classified: Secondary | ICD-10-CM

## 2024-02-17 DIAGNOSIS — D179 Benign lipomatous neoplasm, unspecified: Secondary | ICD-10-CM

## 2024-02-17 NOTE — Progress Notes (Signed)
   New Patient Visit   Subjective  Melisha Deyoe is a 43 y.o. female who presents for the following: Here to discuss treatment options for what she believes to be multiple lipomas. Present for years. Growing and increasing in number. Spanish interpreter at bedside.   The following portions of the chart were reviewed this encounter and updated as appropriate: medications, allergies, medical history  Review of Systems:  No other skin or systemic complaints except as noted in HPI or Assessment and Plan.  Objective  Well appearing patient in no apparent distress; mood and affect are within normal limits.  A full examination was performed including scalp, head, eyes, ears, nose, lips, neck, chest, axillae, abdomen, back, buttocks, bilateral upper extremities, bilateral lower extremities, hands, feet, fingers, toes, fingernails, and toenails. All findings within normal limits unless otherwise noted below.   Relevant exam findings are noted in the Assessment and Plan.  Left Forearm - Anterior, Left Thigh - Anterior, Left Upper Arm - Anterior, Right Breast, Right Thigh - Anterior Dercum's disease, also known as adiposis dolorosa, is a rare disorder characterized by the presence of multiple, painful lipomas (fatty tumors) in the subcutaneous tissue, typically in the abdomen and extremities. It's often associated with chronic pain in adipose tissue and may have neurological and endocrine components, though its exact cause is unknown. Treatment focuses on pain management, as there is no known cure.   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 These lipomas are located in the subcutaneous tissue, making surgical removal challenging and potentially resulting in significant scarring. Additionally, even  with removal, the patient is expected to continue developing new lesions over time. Notably, the patient's older sister has similar lesions, suggesting a possible familial component. We discussed the off-label use of Kybella at academic centers as a non-surgical option, which may help reduce the size of some lipomas. Alternatively, surgical excision in consultation with plastic surgery was considered, weighing the risks of scarring and recurrence.  Will send referral to Plastic surgery to discuss removal DERCUM DISEASE (5) Left Forearm - Anterior, Left Thigh - Anterior, Left Upper Arm - Anterior, Right Breast, Right Thigh - Anterior   No follow-ups on file.  I, Berwyn Lesches, Surg Tech III, am acting as scribe for RUFUS CHRISTELLA HOLY, MD.   Documentation: I have reviewed the above documentation for accuracy and completeness, and I agree with the above.  RUFUS CHRISTELLA HOLY, MD

## 2024-02-17 NOTE — Patient Instructions (Addendum)
 Enfermedad de Dercum: tambin conocida como adiposis dolorosa, es un trastorno poco comn que se caracteriza por la presencia de mltiples lipomas (tumores grasos) dolorosos en el tejido subcutneo, generalmente en el abdomen y las extremidades. Suele asociarse con dolor crnico en el tejido adiposo y puede tener componentes neurolgicos y endocrinos, aunque se desconoce su causa exacta. El tratamiento se centra en el control del dolor, ya que no existe cura conocida.  Kybella es un tratamiento inyectable aprobado por la FDA que se cocos (keeling) islands para reducir la grasa submentoniana, tambin conocida como papada. Utiliza cido desoxiclico, una sustancia natural del cuerpo que ayuda a descomponer y Economist. Al inyectarse en la grasa debajo del mentn, Kybella destruye las clulas grasas, lo que produce ignacia notable reduccin del volumen y una mandbula ms definida. Su uso para Dercums estara fuera de indicacin.     Dercum's disease: also known as adiposis dolorosa, is a rare disorder characterized by the presence of multiple, painful lipomas (fatty tumors) in the subcutaneous tissue, typically in the abdomen and extremities. It's often associated with chronic pain in adipose tissue and may have neurological and endocrine components, though its exact cause is unknown. Treatment focuses on pain management, as there is no known cure.   Kybella is an FDA-approved injectable treatment used to reduce submental fat, also known as a double chin. It utilizes deoxycholic acid, a naturally occurring substance in the body that helps break down and absorb fat. When injected into the fat beneath the chin, Kybella destroys fat cells, leading to a noticeable reduction in fullness and a more sculpted jawline. It would be off label to use for Dercums.  Important Information  Due to recent changes in healthcare laws, you may see results of your pathology and/or laboratory studies on MyChart before the doctors have had  a chance to review them. We understand that in some cases there may be results that are confusing or concerning to you. Please understand that not all results are received at the same time and often the doctors may need to interpret multiple results in order to provide you with the best plan of care or course of treatment. Therefore, we ask that you please give us  2 business days to thoroughly review all your results before contacting the office for clarification. Should we see a critical lab result, you will be contacted sooner.   If You Need Anything After Your Visit  If you have any questions or concerns for your doctor, please call our main line at [phone] If no one answers, please leave a voicemail as directed and we will return your call as soon as possible. Messages left after 4 pm will be answered the following business day.   You may also send us  a message via MyChart. We typically respond to MyChart messages within 1-2 business days.  For prescription refills, please ask your pharmacy to contact our office. Our fax number is [phone].  If you have an urgent issue when the clinic is closed that cannot wait until the next business day, you can page your doctor at the number below.    Please note that while we do our best to be available for urgent issues outside of office hours, we are not available 24/7.   If you have an urgent issue and are unable to reach us , you may choose to seek medical care at your doctor's office, retail clinic, urgent care center, or emergency room.  If you have a medical emergency, please immediately  call 911 or go to the emergency department. In the event of inclement weather, please call our main line at [phone] for an update on the status of any delays or closures.  Dermatology Medication Tips: Please keep the boxes that topical medications come in in order to help keep track of the instructions about where and how to use these. Pharmacies  typically print the medication instructions only on the boxes and not directly on the medication tubes.   If your medication is too expensive, please contact our office at [phone] or send us  a message through MyChart.   We are unable to tell what your co-pay for medications will be in advance as this is different depending on your insurance coverage. However, we may be able to find a substitute medication at lower cost or fill out paperwork to get insurance to cover a needed medication.   If a prior authorization is required to get your medication covered by your insurance company, please allow us  1-2 business days to complete this process.  Drug prices often vary depending on where the prescription is filled and some pharmacies may offer cheaper prices.  The website www.goodrx.com contains coupons for medications through different pharmacies. The prices here do not account for what the cost may be with help from insurance (it may be cheaper with your insurance), but the website can give you the price if you did not use any insurance.  - You can print the associated coupon and take it with your prescription to the pharmacy.  - You may also stop by our office during regular business hours and pick up a GoodRx coupon card.  - If you need your prescription sent electronically to a different pharmacy, notify our office through [facility] or by phone at [phone]

## 2024-03-06 ENCOUNTER — Ambulatory Visit: Payer: Self-pay | Admitting: Nurse Practitioner

## 2024-04-27 ENCOUNTER — Encounter: Payer: Self-pay | Admitting: Nurse Practitioner

## 2024-04-27 ENCOUNTER — Ambulatory Visit: Payer: Self-pay | Attending: Nurse Practitioner | Admitting: Nurse Practitioner

## 2024-04-27 VITALS — BP 125/75 | HR 78 | Ht 59.0 in | Wt 199.0 lb

## 2024-04-27 DIAGNOSIS — R7303 Prediabetes: Secondary | ICD-10-CM

## 2024-04-27 DIAGNOSIS — E882 Lipomatosis, not elsewhere classified: Secondary | ICD-10-CM

## 2024-04-27 DIAGNOSIS — Z1231 Encounter for screening mammogram for malignant neoplasm of breast: Secondary | ICD-10-CM

## 2024-04-27 DIAGNOSIS — N6314 Unspecified lump in the right breast, lower inner quadrant: Secondary | ICD-10-CM

## 2024-04-27 DIAGNOSIS — D72829 Elevated white blood cell count, unspecified: Secondary | ICD-10-CM

## 2024-04-27 DIAGNOSIS — N644 Mastodynia: Secondary | ICD-10-CM

## 2024-04-27 DIAGNOSIS — Z23 Encounter for immunization: Secondary | ICD-10-CM

## 2024-04-27 DIAGNOSIS — E7800 Pure hypercholesterolemia, unspecified: Secondary | ICD-10-CM

## 2024-04-27 NOTE — Progress Notes (Signed)
 Assessment & Plan:  Regina Shea was seen today for breast pain and migraine.  Diagnoses and all orders for this visit:  Breast pain, left -     MS 3D SCR MAMMO BILAT BR (aka MM); Future Breast lumps and pain Lumps in right breast suspected to be lipomas. Pain in left breast possibly hormonal. - Order mammogram to evaluate breast lumps. - Fax mammogram scholarship form to clinic. - Instruct her to call clinic if not contacted by end of week. - Advise against scheduling mammogram during menstrual period.   Encounter for screening mammogram for malignant neoplasm of breast -     MS 3D SCR MAMMO BILAT BR (aka MM); Future  Need for influenza vaccination -     Flu vaccine trivalent PF, 6mos and older(Flulaval,Afluria,Fluarix,Fluzone)  Prediabetes -     CMP14+EGFR -     Hemoglobin A1c  Leukocytosis, unspecified type -     CBC with Differential  Hypercholesterolemia -     Lipid panel  Lump in lower inner quadrant of right breast -     MS 3D SCR MAMMO BILAT BR (aka MM); Future Breast lumps and pain Lumps in right breast suspected to be lipomas. Pain in left breast possibly hormonal. - Order mammogram to evaluate breast lumps. - Fax mammogram scholarship form to clinic. - Instruct her to call clinic if not contacted by end of week. - Advise against scheduling mammogram during menstrual period.   Patient has been counseled on age-appropriate routine health concerns for screening and prevention. These are reviewed and up-to-date. Referrals have been placed accordingly. Immunizations are up-to-date or declined.    Subjective:   Chief Complaint  Patient presents with   Breast Pain    Left breast pain started 3 weeks ago. Pain is intermittent.    Migraine     History of Present Illness Regina Shea is a 42 year old female who presents with right breast lumps and left breast pain.  VRI was used to communicate directly with patient for the entire encounter including  providing detailed patient instructions.    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  A few months ago she noticed a similar lump on her right breast.  She reports that she can feel it when sitting but not when lying down.  She experiences pain in the left breast, affecting the entire inner part, distinct from the lump on the right. She is concerned about the nature of these lumps and whether they could be lipomas as well. She is also currently on her menstrual cycle.  She had a migraine four days ago, managed with over-the-counter migraine medication from [place].  She is concerned about her menstrual cycle, noting that her period, expected on the 8th of the month, arrived two days ago. She questions whether this irregularity and her breast pain could be related to menopause or residual milk in her breasts.   Review of Systems  Constitutional: Negative.   Respiratory: Negative.    Cardiovascular: Negative.   Genitourinary:        SEE HPI  Skin:        SEE HPI  Neurological:  Positive for headaches.  Psychiatric/Behavioral: Negative.      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    Gestational diabetes 05/23/2022   History of asthma    Lipoma 2022   Migraines  Perirectal abscess 09/27/2023   Urethral laceration 09/27/2022   Vision loss 2012   states lost vision in [place] but it returned, they told her may be aneurysm, but never had follow up    Past Surgical History:  Procedure Laterality Date   ANAL FISTULOTOMY N/A 11/20/2023   Procedure: ANAL FISTULOTOMY;  Surgeon: Marinda Jayson KIDD, MD;  Location: [facility] ORS;  Service: General;  Laterality: N/A;   INCISION AND DRAINAGE ABSCESS N/A 09/27/2023   Procedure: INCISION AND DRAINAGE ABSCESS, perirectal;  Surgeon: Marinda Jayson KIDD, MD;  Location: [facility];  Service: General;  Laterality: N/A;   PLACEMENT OF SETON N/A  09/27/2023   Procedure: PLACEMENT OF SETON;  Surgeon: Marinda Jayson KIDD, MD;  Location: [facility] ORS;  Service: General;  Laterality: N/A;   RECTAL EXAM UNDER ANESTHESIA N/A 01/23/2019   Procedure: RECTAL EXAM UNDER ANESTHESIA, with FISTULOTOMY and  FIBRIN GLUE;  Surgeon: Marolyn Nest, MD;  Location: [facility] OR[facility];  Service: General;  Laterality: N/A;   RECTAL EXAM UNDER ANESTHESIA N/A 11/20/2023   Procedure: EXAM UNDER ANESTHESIA, RECTUM;  Surgeon: Marinda Jayson KIDD, MD;  Location: [facility];  Service: General;  Laterality: N/A;    Family History  Problem Relation Age of Onset   Hypertension Mother    Asthma Mother    Diabetes Father    Hypertension Father    Hypertension Brother    Cancer Maternal Uncle    Heart disease Neg Hx    Stroke Neg Hx     Social History Reviewed with no changes to be made today.   Outpatient Medications Prior to Visit  Medication Sig Dispense Refill   ibuprofen  (ADVIL ) 600 MG tablet Take 1 tablet (600 mg total) by mouth every 8 (eight) hours as needed. 60 tablet 3   acetaminophen  (TYLENOL ) 500 MG tablet Take 500-1,000 mg by mouth every 6 (six) hours as needed (pain.).     docusate sodium  (COLACE) 100 MG capsule Take 1 capsule (100 mg total) by mouth 2 (two) times daily. (Patient not taking: Reported on 04/27/2024) 10 capsule 0   lidocaine  (XYLOCAINE ) 5 % ointment Apply 1 Application topically 3 (three) times daily as needed (use to the rectal area as needed). (Patient not taking: Reported on 04/27/2024) 35.44 g 0   meloxicam (MOBIC) 15 MG tablet Take 15 mg by mouth daily. (Patient not taking: Reported on 04/27/2024)     No facility-administered medications prior to visit.    No Known Allergies     Objective:    BP (!) 142/83 (BP Location: Left Arm, Patient Position: Sitting, Cuff Size: Normal)   Pulse 78   Ht 4' 11 (1.499 m)   Wt 199 lb (90.3 kg)   LMP 04/25/2024 (Exact Date)   SpO2 100%   Breastfeeding No   BMI 40.19 kg/m  Wt Readings from Last 3  Encounters:  04/27/24 199 lb (90.3 kg)  01/02/24 194 lb (88 kg)  11/20/23 196 lb (88.9 kg)    Physical Exam Constitutional:      Appearance: Normal appearance.  HENT:     Head: Normocephalic.  Cardiovascular:     Rate and Rhythm: Normal rate and regular rhythm.  Pulmonary:     Effort: Pulmonary effort is normal.     Breath sounds: Normal breath sounds.  Chest:  Breasts:    Right: Mass present.       Comments: Pain reported in left breast area. No palpable lumps  In highlighted areas of right breast are palpable masses. 1cm  in circumference Skin:    General: Skin is warm and dry.  Neurological:     Mental Status: She is alert and oriented to person, place, and time.  Psychiatric:        Mood and Affect: Mood normal.          Patient has been counseled extensively about nutrition and exercise as well as the importance of adherence with medications and regular follow-up. The patient was given clear instructions to go to ER or return to medical center if symptoms don't improve, worsen or new problems develop. The patient verbalized understanding.   Follow-up: Return if symptoms worsen or fail to improve.   Haze LELON Servant, FNP-BC [facility], [place] [phone]   04/27/2024, 9:33 AM

## 2024-04-27 NOTE — Patient Instructions (Signed)
 [facility]. You can call them at any of these numbers to schedule.  [phone]

## 2024-04-28 LAB — CBC WITH DIFFERENTIAL/PLATELET
Basophils Absolute: 0.1 x10E3/uL (ref 0.0–0.2)
Basos: 1 %
EOS (ABSOLUTE): 0.1 x10E3/uL (ref 0.0–0.4)
Eos: 2 %
Hematocrit: 43.9 % (ref 34.0–46.6)
Hemoglobin: 14 g/dL (ref 11.1–15.9)
Immature Grans (Abs): 0 x10E3/uL (ref 0.0–0.1)
Immature Granulocytes: 0 %
Lymphocytes Absolute: 3.7 x10E3/uL — ABNORMAL HIGH (ref 0.7–3.1)
Lymphs: 45 %
MCH: 28.4 pg (ref 26.6–33.0)
MCHC: 31.9 g/dL (ref 31.5–35.7)
MCV: 89 fL (ref 79–97)
Monocytes Absolute: 0.5 x10E3/uL (ref 0.1–0.9)
Monocytes: 6 %
Neutrophils Absolute: 3.7 x10E3/uL (ref 1.4–7.0)
Neutrophils: 46 %
Platelets: 313 x10E3/uL (ref 150–450)
RBC: 4.93 x10E6/uL (ref 3.77–5.28)
RDW: 13.4 % (ref 11.7–15.4)
WBC: 8.1 x10E3/uL (ref 3.4–10.8)

## 2024-04-28 LAB — LIPID PANEL
Chol/HDL Ratio: 2.7 ratio (ref 0.0–4.4)
Cholesterol, Total: 129 mg/dL (ref 100–199)
HDL: 48 mg/dL (ref >39)
LDL Chol Calc (NIH): 62 mg/dL (ref 0–99)
Triglycerides: 100 mg/dL (ref 0–149)
VLDL Cholesterol Cal: 19 mg/dL (ref 5–40)

## 2024-04-28 LAB — CMP14+EGFR
ALT: 26 IU/L (ref 0–32)
AST: 18 IU/L (ref 0–40)
Albumin: 4.3 g/dL (ref 3.9–4.9)
Alkaline Phosphatase: 124 IU/L — ABNORMAL HIGH (ref 41–116)
BUN/Creatinine Ratio: 19 (ref 9–23)
BUN: 13 mg/dL (ref 6–24)
Bilirubin Total: 0.4 mg/dL (ref 0.0–1.2)
CO2: 20 mmol/L (ref 20–29)
Calcium: 9 mg/dL (ref 8.7–10.2)
Chloride: 105 mmol/L (ref 96–106)
Creatinine, Ser: 0.69 mg/dL (ref 0.57–1.00)
Globulin, Total: 3 g/dL (ref 1.5–4.5)
Glucose: 99 mg/dL (ref 70–99)
Potassium: 4.2 mmol/L (ref 3.5–5.2)
Sodium: 140 mmol/L (ref 134–144)
Total Protein: 7.3 g/dL (ref 6.0–8.5)
eGFR: 110 mL/min/1.73 (ref >59)

## 2024-04-28 LAB — HEMOGLOBIN A1C
Est. average glucose Bld gHb Est-mCnc: 114 mg/dL
Hgb A1c MFr Bld: 5.6 % (ref 4.8–5.6)

## 2024-04-29 ENCOUNTER — Ambulatory Visit: Payer: Self-pay | Admitting: Nurse Practitioner

## 2024-05-04 ENCOUNTER — Other Ambulatory Visit: Payer: Self-pay

## 2024-05-04 DIAGNOSIS — N644 Mastodynia: Secondary | ICD-10-CM

## 2024-05-04 DIAGNOSIS — N6310 Unspecified lump in the right breast, unspecified quadrant: Secondary | ICD-10-CM

## 2024-05-19 ENCOUNTER — Ambulatory Visit: Payer: Self-pay

## 2024-05-19 ENCOUNTER — Other Ambulatory Visit (HOSPITAL_COMMUNITY)
Admission: RE | Admit: 2024-05-19 | Discharge: 2024-05-19 | Disposition: A | Discharge status: Home / Self Care | Payer: Self-pay | Source: Ambulatory Visit | Attending: Obstetrics and Gynecology | Admitting: Obstetrics and Gynecology

## 2024-05-19 VITALS — BP 141/82 | Wt 195.0 lb

## 2024-05-19 DIAGNOSIS — N6322 Unspecified lump in the left breast, upper inner quadrant: Secondary | ICD-10-CM

## 2024-05-19 DIAGNOSIS — N6452 Nipple discharge: Secondary | ICD-10-CM

## 2024-05-19 DIAGNOSIS — Z1239 Encounter for other screening for malignant neoplasm of breast: Secondary | ICD-10-CM

## 2024-05-19 DIAGNOSIS — N6315 Unspecified lump in the right breast, overlapping quadrants: Secondary | ICD-10-CM

## 2024-05-19 DIAGNOSIS — N644 Mastodynia: Secondary | ICD-10-CM

## 2024-05-19 NOTE — Patient Instructions (Incomplete)
 Taught Regina Shea how to perform BSE and gave educational materials to take home. Patient did not need a Pap smear today due to last Pap smear was in *** per patient. Told patient about free cervical cancer screenings to receive a Pap smear if would like one next year. Let her know [facility] will cover Pap smears every 3 years unless has a history of abnormal Pap smears. Referred patient to the [facility] for diagnostic mammogram. Appointment scheduled for ***, 2014 at ***. Patient aware of appointment and will be there. Let patient know will follow up with her within the next couple weeks with results. Regina Shea verbalized understanding.  [name], [name], RN @T @ 1:48 PM

## 2024-05-19 NOTE — Progress Notes (Unsigned)
 Ms. Juliann Olesky is a 43 y.o. female who presents to [facility] clinic today with {Blank single:19197::no complaints,complaint of} ***.    Pap Smear: Pap not smear completed today. Last Pap smear was *** at *** clinic and was {Blank single:19197::normal,abnormal - ***}. Per patient has {Blank single:19197::no history,history} of an abnormal Pap smear. Last Pap smear result {Blank single:19197::is available in,is not available in} Epic.   Physical exam: Breasts Breasts symmetrical. No skin abnormalities bilateral breasts. No nipple retraction bilateral breasts. No nipple discharge bilateral breasts. No lymphadenopathy. No lumps palpated bilateral breasts.       Pelvic/Bimanual Pap is not indicated today    Smoking History: Patient has {Blank single:19197::never smoked,is a former smoker,is a current smoker at *** packs per day} ***referred to quit line.    Patient Navigation: Patient education provided. Access to services provided for patient through [facility] program. *** interpreter provided. *** transportation provided   Colorectal Cancer Screening: Per patient {Blank single:19197::has had colonoscopy completed on ***,has never had colonoscopy completed} No complaints today.    Breast and Cervical Cancer Risk Assessment: Patient {Blank single:19197::has,does not have} family history of breast cancer, known genetic mutations, or radiation treatment to the chest before age 17. Patient {Blank single:19197::has,does not have} history of cervical dysplasia, immunocompromised, or DES exposure in-utero.  Risk Scores as of Encounter on 05/19/2024     [place]           5-year 0.64%   Lifetime 8.79%   This patient is [place] but has no documented birth country, so the [place] model used data from [place] patients to calculate their risk score. Document a birth country in the Demographics activity for a more accurate score.         Last calculated by Silas,  Ansyi K, CMA on 05/19/2024 at  1:09 PM        A: [facility] exam without pap smear Complaint of ***  P: Referred patient to the [facility] for a {Blank single:19197::screening,diagnostic} mammogram. Appointment scheduled ***.  Driscilla Wanda SQUIBB, RN 05/19/2024 1:48 PM

## 2024-05-20 ENCOUNTER — Ambulatory Visit
Admission: RE | Admit: 2024-05-20 | Discharge: 2024-05-20 | Disposition: A | Payer: Self-pay | Source: Ambulatory Visit | Attending: Obstetrics and Gynecology | Admitting: Obstetrics and Gynecology

## 2024-05-20 ENCOUNTER — Other Ambulatory Visit: Payer: Self-pay | Admitting: Obstetrics and Gynecology

## 2024-05-20 ENCOUNTER — Ambulatory Visit: Payer: Self-pay

## 2024-05-20 DIAGNOSIS — N6310 Unspecified lump in the right breast, unspecified quadrant: Secondary | ICD-10-CM

## 2024-05-20 DIAGNOSIS — N644 Mastodynia: Secondary | ICD-10-CM

## 2024-05-21 LAB — CYTOLOGY - NON PAP

## 2024-05-22 ENCOUNTER — Telehealth: Payer: Self-pay

## 2024-05-22 NOTE — Telephone Encounter (Signed)
 [facility], [place] # [phone] Services, Patient informed no cancerous/malignant seen in breast discharge pathology, needs to keep 35-month diagnostic mammogram appointment as scheduled, call if symptoms worsen or persist. Patient verbalized understanding.

## 2024-11-18 ENCOUNTER — Other Ambulatory Visit
